# Patient Record
Sex: Female | Born: 1955 | Race: White | Hispanic: No | Smoking: Current every day smoker
Health system: Southern US, Community
[De-identification: ages and names within clinical notes are randomized; demographics above are authoritative.]

## PROBLEM LIST (undated history)

## (undated) DIAGNOSIS — F32A Depression, unspecified: Secondary | ICD-10-CM

## (undated) DIAGNOSIS — E079 Disorder of thyroid, unspecified: Secondary | ICD-10-CM

## (undated) DIAGNOSIS — F329 Major depressive disorder, single episode, unspecified: Secondary | ICD-10-CM

## (undated) DIAGNOSIS — J449 Chronic obstructive pulmonary disease, unspecified: Secondary | ICD-10-CM

## (undated) DIAGNOSIS — F419 Anxiety disorder, unspecified: Secondary | ICD-10-CM

## (undated) HISTORY — PX: EYE SURGERY: SHX253

## (undated) HISTORY — PX: HIP SURGERY: SHX245

---

## 2004-08-30 ENCOUNTER — Emergency Department: Payer: Self-pay | Admitting: Emergency Medicine

## 2004-10-16 ENCOUNTER — Emergency Department: Payer: Self-pay | Admitting: Emergency Medicine

## 2005-03-09 ENCOUNTER — Emergency Department: Payer: Self-pay | Admitting: Emergency Medicine

## 2005-04-07 ENCOUNTER — Emergency Department: Payer: Self-pay | Admitting: Emergency Medicine

## 2005-07-26 ENCOUNTER — Inpatient Hospital Stay: Payer: Self-pay | Admitting: Internal Medicine

## 2005-07-26 ENCOUNTER — Other Ambulatory Visit: Payer: Self-pay

## 2005-07-27 ENCOUNTER — Inpatient Hospital Stay: Payer: Self-pay | Admitting: Psychiatry

## 2005-11-20 ENCOUNTER — Emergency Department: Payer: Self-pay | Admitting: Emergency Medicine

## 2006-03-21 ENCOUNTER — Emergency Department: Payer: Self-pay | Admitting: Emergency Medicine

## 2006-05-19 ENCOUNTER — Emergency Department: Payer: Self-pay | Admitting: Emergency Medicine

## 2006-05-19 ENCOUNTER — Other Ambulatory Visit: Payer: Self-pay

## 2006-10-05 ENCOUNTER — Emergency Department: Payer: Self-pay | Admitting: Emergency Medicine

## 2006-10-06 ENCOUNTER — Emergency Department: Payer: Self-pay

## 2006-10-07 ENCOUNTER — Emergency Department: Payer: Self-pay | Admitting: Emergency Medicine

## 2006-10-09 ENCOUNTER — Emergency Department: Payer: Self-pay

## 2006-10-11 ENCOUNTER — Emergency Department: Payer: Self-pay | Admitting: Emergency Medicine

## 2007-03-13 ENCOUNTER — Other Ambulatory Visit: Payer: Self-pay

## 2007-03-13 ENCOUNTER — Emergency Department: Payer: Self-pay | Admitting: Emergency Medicine

## 2007-04-09 ENCOUNTER — Emergency Department: Payer: Self-pay | Admitting: Emergency Medicine

## 2007-05-30 ENCOUNTER — Emergency Department: Payer: Self-pay | Admitting: Emergency Medicine

## 2007-06-19 ENCOUNTER — Emergency Department: Payer: Self-pay | Admitting: Emergency Medicine

## 2008-05-20 ENCOUNTER — Emergency Department: Payer: Self-pay | Admitting: Emergency Medicine

## 2008-06-29 ENCOUNTER — Emergency Department: Payer: Self-pay | Admitting: Emergency Medicine

## 2008-10-09 ENCOUNTER — Inpatient Hospital Stay: Payer: Self-pay | Admitting: Internal Medicine

## 2008-10-10 ENCOUNTER — Inpatient Hospital Stay: Payer: Self-pay | Admitting: Psychiatry

## 2008-12-26 ENCOUNTER — Emergency Department: Payer: Self-pay | Admitting: Emergency Medicine

## 2009-03-26 ENCOUNTER — Ambulatory Visit: Payer: Self-pay | Admitting: Ophthalmology

## 2009-03-31 ENCOUNTER — Ambulatory Visit: Payer: Self-pay | Admitting: Ophthalmology

## 2009-05-20 ENCOUNTER — Ambulatory Visit: Payer: Self-pay | Admitting: Otolaryngology

## 2009-07-20 ENCOUNTER — Ambulatory Visit: Payer: Self-pay | Admitting: Ophthalmology

## 2009-11-09 ENCOUNTER — Inpatient Hospital Stay: Payer: Self-pay | Admitting: Internal Medicine

## 2009-11-10 ENCOUNTER — Inpatient Hospital Stay: Payer: Self-pay | Admitting: Unknown Physician Specialty

## 2010-01-13 ENCOUNTER — Ambulatory Visit: Payer: Self-pay | Admitting: Gastroenterology

## 2010-04-24 ENCOUNTER — Emergency Department: Payer: Self-pay | Admitting: Unknown Physician Specialty

## 2010-10-16 ENCOUNTER — Emergency Department: Payer: Self-pay | Admitting: Emergency Medicine

## 2011-04-09 ENCOUNTER — Emergency Department: Payer: Self-pay | Admitting: Unknown Physician Specialty

## 2011-04-24 ENCOUNTER — Emergency Department: Payer: Self-pay | Admitting: Emergency Medicine

## 2011-05-13 ENCOUNTER — Emergency Department: Payer: Self-pay | Admitting: Emergency Medicine

## 2012-06-07 ENCOUNTER — Emergency Department: Payer: Self-pay | Admitting: Emergency Medicine

## 2012-06-07 LAB — COMPREHENSIVE METABOLIC PANEL
Albumin: 3.1 g/dL — ABNORMAL LOW (ref 3.4–5.0)
Alkaline Phosphatase: 115 U/L (ref 50–136)
Anion Gap: 4 — ABNORMAL LOW (ref 7–16)
BUN: 3 mg/dL — ABNORMAL LOW (ref 7–18)
Bilirubin,Total: 0.3 mg/dL (ref 0.2–1.0)
Chloride: 104 mmol/L (ref 98–107)
Co2: 31 mmol/L (ref 21–32)
Creatinine: 0.69 mg/dL (ref 0.60–1.30)
EGFR (African American): >60
EGFR (Non-African Amer.): >60
Potassium: 3.6 mmol/L (ref 3.5–5.1)
SGOT(AST): 19 U/L (ref 15–37)
SGPT (ALT): 13 U/L

## 2012-06-07 LAB — CBC
HCT: 37.6 % (ref 35.0–47.0)
HGB: 12.5 g/dL (ref 12.0–16.0)
MCH: 28.9 pg (ref 26.0–34.0)
MCV: 87 fL (ref 80–100)
RBC: 4.32 10*6/uL (ref 3.80–5.20)
WBC: 5 10*3/uL (ref 3.6–11.0)

## 2012-10-06 ENCOUNTER — Emergency Department: Payer: Self-pay | Admitting: Emergency Medicine

## 2012-10-13 ENCOUNTER — Emergency Department: Payer: Self-pay | Admitting: Unknown Physician Specialty

## 2013-01-16 ENCOUNTER — Inpatient Hospital Stay: Payer: Self-pay | Admitting: General Practice

## 2013-01-16 LAB — DRUG SCREEN, URINE
Barbiturates, Ur Screen: NEGATIVE (ref ?–200)
Benzodiazepine, Ur Scrn: POSITIVE (ref ?–200)
Cannabinoid 50 Ng, Ur ~~LOC~~: NEGATIVE (ref ?–50)
Cocaine Metabolite,Ur ~~LOC~~: NEGATIVE (ref ?–300)
MDMA (Ecstasy)Ur Screen: NEGATIVE (ref ?–500)
Methadone, Ur Screen: NEGATIVE (ref ?–300)

## 2013-01-16 LAB — COMPREHENSIVE METABOLIC PANEL
Anion Gap: 10 (ref 7–16)
BUN: 8 mg/dL (ref 7–18)
Bilirubin,Total: 0.5 mg/dL (ref 0.2–1.0)
Chloride: 96 mmol/L — ABNORMAL LOW (ref 98–107)
Creatinine: 0.73 mg/dL (ref 0.60–1.30)
EGFR (African American): >60
EGFR (Non-African Amer.): >60
Osmolality: 261 (ref 275–301)
SGOT(AST): 27 U/L (ref 15–37)
SGPT (ALT): 18 U/L (ref 12–78)
Total Protein: 7.3 g/dL (ref 6.4–8.2)

## 2013-01-16 LAB — URINALYSIS, COMPLETE
Bacteria: NONE SEEN
Bilirubin,UR: NEGATIVE
Glucose,UR: NEGATIVE mg/dL (ref 0–75)
Ketone: NEGATIVE
Nitrite: NEGATIVE
Ph: 6 (ref 4.5–8.0)
Squamous Epithelial: <1
WBC UR: 816 /HPF (ref 0–5)

## 2013-01-16 LAB — CBC
HCT: 34.3 % — ABNORMAL LOW (ref 35.0–47.0)
HGB: 11.6 g/dL — ABNORMAL LOW (ref 12.0–16.0)
MCH: 30 pg (ref 26.0–34.0)
MCHC: 33.8 g/dL (ref 32.0–36.0)
MCV: 89 fL (ref 80–100)
RDW: 14.8 % — ABNORMAL HIGH (ref 11.5–14.5)
WBC: 11 10*3/uL (ref 3.6–11.0)

## 2013-01-16 LAB — APTT: Activated PTT: 30.1 secs (ref 23.6–35.9)

## 2013-01-16 LAB — PROTIME-INR: Prothrombin Time: 12.6 secs (ref 11.5–14.7)

## 2013-01-17 LAB — BASIC METABOLIC PANEL
Anion Gap: 6 — ABNORMAL LOW (ref 7–16)
Calcium, Total: 7.7 mg/dL — ABNORMAL LOW (ref 8.5–10.1)
Chloride: 107 mmol/L (ref 98–107)
Co2: 26 mmol/L (ref 21–32)
Creatinine: 0.74 mg/dL (ref 0.60–1.30)
Glucose: 131 mg/dL — ABNORMAL HIGH (ref 65–99)
Osmolality: 277 (ref 275–301)
Potassium: 4.3 mmol/L (ref 3.5–5.1)

## 2013-01-17 LAB — HEMOGLOBIN
HGB: 8.4 g/dL — ABNORMAL LOW (ref 12.0–16.0)
HGB: 7.2 g/dL — ABNORMAL LOW (ref 12.0–16.0)

## 2013-01-17 LAB — PLATELET COUNT: Platelet: 134 10*3/uL — ABNORMAL LOW (ref 150–440)

## 2013-01-18 LAB — BASIC METABOLIC PANEL
Anion Gap: 7 (ref 7–16)
BUN: 4 mg/dL — ABNORMAL LOW (ref 7–18)
Calcium, Total: 7.7 mg/dL — ABNORMAL LOW (ref 8.5–10.1)
Chloride: 107 mmol/L (ref 98–107)
Co2: 26 mmol/L (ref 21–32)
Glucose: 114 mg/dL — ABNORMAL HIGH (ref 65–99)
Osmolality: 277 (ref 275–301)
Potassium: 3.7 mmol/L (ref 3.5–5.1)
Sodium: 140 mmol/L (ref 136–145)

## 2013-01-18 LAB — URINE CULTURE

## 2013-01-20 LAB — HEMOGLOBIN: HGB: 8 g/dL — ABNORMAL LOW (ref 12.0–16.0)

## 2013-01-21 LAB — BASIC METABOLIC PANEL
Anion Gap: 5 — ABNORMAL LOW (ref 7–16)
Co2: 27 mmol/L (ref 21–32)
Creatinine: 0.85 mg/dL (ref 0.60–1.30)
EGFR (African American): >60
Glucose: 117 mg/dL — ABNORMAL HIGH (ref 65–99)
Osmolality: 286 (ref 275–301)
Sodium: 143 mmol/L (ref 136–145)

## 2013-01-21 LAB — HEMOGLOBIN: HGB: 8.3 g/dL — ABNORMAL LOW (ref 12.0–16.0)

## 2013-01-22 LAB — HEMOGLOBIN: HGB: 9.5 g/dL — ABNORMAL LOW (ref 12.0–16.0)

## 2014-10-01 ENCOUNTER — Emergency Department: Payer: Self-pay | Admitting: Emergency Medicine

## 2014-10-01 LAB — CBC
HCT: 41.7 % (ref 35.0–47.0)
HGB: 13.9 g/dL (ref 12.0–16.0)
MCH: 29.8 pg (ref 26.0–34.0)
MCHC: 33.2 g/dL (ref 32.0–36.0)
MCV: 90 fL (ref 80–100)
Platelet: 162 10*3/uL (ref 150–440)
RBC: 4.65 10*6/uL (ref 3.80–5.20)
RDW: 14.1 % (ref 11.5–14.5)
WBC: 6.8 10*3/uL (ref 3.6–11.0)

## 2014-10-01 LAB — TROPONIN I: Troponin-I: <0.02 ng/mL

## 2014-10-01 LAB — BASIC METABOLIC PANEL
ANION GAP: 7 (ref 7–16)
BUN: 12 mg/dL (ref 7–18)
Calcium, Total: 8.6 mg/dL (ref 8.5–10.1)
Chloride: 105 mmol/L (ref 98–107)
Co2: 27 mmol/L (ref 21–32)
Creatinine: 0.8 mg/dL (ref 0.60–1.30)
EGFR (Non-African Amer.): >60
GLUCOSE: 97 mg/dL (ref 65–99)
OSMOLALITY: 277 (ref 275–301)
Potassium: 3.7 mmol/L (ref 3.5–5.1)
SODIUM: 139 mmol/L (ref 136–145)

## 2015-03-08 ENCOUNTER — Emergency Department
Admit: 2015-03-08 | Disposition: A | Discharge status: Home / Self Care | Payer: Self-pay | Admitting: Emergency Medicine
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
## 2015-03-20 NOTE — Op Note (Signed)
PATIENT NAME:  Caroline Coleman, Caroline Coleman MR#:  [id number] DATE OF BIRTH:  Jul 05, 1956  DATE OF PROCEDURE:  01/16/2013  PREOPERATIVE DIAGNOSIS: Comminuted right intertrochanteric femur fracture.   POSTOPERATIVE DIAGNOSIS: Comminuted right intertrochanteric femur fracture.   PROCEDURE PERFORMED: Open reduction and internal fixation, right intertrochanteric femur fracture.   SURGEON: Illene LabradorJames P. Angie FavaHooten Jr., MD   ANESTHESIA: General.   ESTIMATED BLOOD LOSS: 50 mL.   FLUIDS REPLACED: 1200 mL of crystalloid.   DRAINS: None.   IMPLANTS UTILIZED: Synthes 11 mm, 130 degree trochanteric fixation nail, 100 mm helical blade and Coleman 5.0 x 38 mm locking screw.   INDICATIONS FOR SURGERY: The patient is Coleman 59 year old female who tripped and fell at home, landing on her right hip. She presented on the day of surgery via EMS, at which time x-rays demonstrated Coleman comminuted right intertrochanteric femur fracture. After discussion of risks and benefits of surgical intervention, the patient expressed understanding of the risks and benefits and agreed with plans for surgical intervention.   PROCEDURE IN DETAIL: The patient was brought into the operating room, and after adequate general anesthesia was achieved, the patient was transferred to the fracture table. Traction was applied to the right lower extremity, and provisional reduction was performed of the fracture. Position was confirmed in both AP and lateral planes using the C-arm. All bony prominences were well padded. The patient's right hip and leg were cleaned and prepped with alcohol and DuraPrep and draped in the usual sterile fashion. Coleman "timeout" was performed as per usual protocol. Coleman lateral longitudinal incision was made extending from the tip of the trochanter proximally. Dissection was carried down through the fascia to the tip of the greater trochanter. Coleman distally threaded guidepin was inserted through the tip of the trochanter down the medullary canal. Position  was confirmed in both AP and lateral planes. Step drill was used to enlarge the entry site. Given the patient's age and radiographic appearance of good cortical bone, it was elected to proceed with placement of Coleman short trochanteric fixation nail. An 11 mm, 130 degree titanium trochanteric fixation nail was advanced over the guidepin. Position was confirmed in both AP and lateral planes. Coleman second stab incision was made, and tissue protector was inserted through the outrigger device and advanced to the lateral aspect of the femur. Distally threaded guidepin was inserted into the femoral neck and head. Good position was noted in both AP and lateral planes. Measurements were obtained, and it was felt that Coleman 100 mm helical blade was appropriate length. Cortex was reamed, followed by advancement of Coleman cannulated reamer. Coleman 100 mm helical blade was then advanced over the guidepin. Good bony purchase was appreciated. Compression was applied using the outrigger device, with excellent compression noted at the fracture site. The locking device was engaged proximally. Next, another lateral stab incision was made, and Coleman tissue protector was inserted through the outrigger device for placement of the distal locking screw. Under visualization with the C-arm, the distal locking site was drilled, and measurement was obtained. Coleman 5.0 x 38 mm locking screw was then utilized. Excellent bony purchase was appreciated. Outrigger device was removed, and the fracture site was visualized in multiple planes using C-arm, with good reduction appreciated and good position of the implants.   The wounds were irrigated with copious amounts of normal saline with antibiotic solution. Fascia was reapproximated using #1 Vicryl. The subcutaneous tissue was approximated in layers using first #0 Vicryl, followed #2-0 Vicryl. Skin was closed with skin  staples. Sterile dressing was applied.   The patient tolerated the procedure well. She was transported to  the recovery room in stable condition.   ____________________________ Illene Labrador. Angie Fava., MD jph:OSi D: 01/17/2013 07:25:00 ET T: 01/17/2013 07:38:04 ET JOB#: [id number]  cc: Illene Labrador. Angie Fava., MD, <Dictator> Illene Labrador Angie Fava MD ELECTRONICALLY SIGNED 01/20/2013 15:13
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
## 2015-03-20 NOTE — Consult Note (Signed)
Brief Consult Note: Diagnosis: Hip fracture.   Patient was seen by consultant.   Consult note dictated.   Recommend to proceed with surgery or procedure.   Orders entered.   Comments: 59 yo female with H/o CAD dx over 2 yrs ago by PCP, copd , depression, hypothyroidsm, h/o etoh abuse and current smoker. pt is cleared for surgery. no need of non invasive stress testing or pft's 1.Hip fracture. risk factors base on cv clearance are mostly her H/o CAD but pt does not have any active symptoms, her surgery is intermediate risk and she is able to do 4MET's actvity wise. she has some SOB mostly related to her COPD but no acute HF or ACS. cv risk ( class I ) 1% risk for complication\. as far as her COPD she is low risk base on Canet criteria. + smoker but no active infection within last month, no changes on CXR. no need for ABG or PFT's work on smoking sessation.  recs. b blocker perioperatorily has demostrated no major advantages if pt has not been taking them before.her HR is 70's and has COPD.  Electronic Signatures: Berlinda LastSanchez Gutierrez, Regan Rakersoberto (MD)  (Signed 19-Feb-14 16:44)  Authored: Brief Consult Note   Last Updated: 19-Feb-14 16:44 by Felipa FurnaceSanchez Gutierrez, Clare Casto (MD)
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
## 2015-05-04 DIAGNOSIS — Z961 Presence of intraocular lens: Secondary | ICD-10-CM | POA: Insufficient documentation

## 2015-05-04 DIAGNOSIS — Z8249 Family history of ischemic heart disease and other diseases of the circulatory system: Secondary | ICD-10-CM | POA: Insufficient documentation

## 2015-05-04 DIAGNOSIS — F411 Generalized anxiety disorder: Secondary | ICD-10-CM | POA: Insufficient documentation

## 2015-05-04 DIAGNOSIS — E039 Hypothyroidism, unspecified: Secondary | ICD-10-CM | POA: Insufficient documentation

## 2015-05-04 DIAGNOSIS — J449 Chronic obstructive pulmonary disease, unspecified: Secondary | ICD-10-CM | POA: Insufficient documentation

## 2015-05-04 DIAGNOSIS — E049 Nontoxic goiter, unspecified: Secondary | ICD-10-CM | POA: Insufficient documentation

## 2015-05-04 DIAGNOSIS — K08109 Complete loss of teeth, unspecified cause, unspecified class: Secondary | ICD-10-CM | POA: Insufficient documentation

## 2015-05-04 DIAGNOSIS — K635 Polyp of colon: Secondary | ICD-10-CM | POA: Insufficient documentation

## 2015-05-04 DIAGNOSIS — E7800 Pure hypercholesterolemia, unspecified: Secondary | ICD-10-CM | POA: Insufficient documentation

## 2015-08-18 ENCOUNTER — Emergency Department: Payer: Medicaid Other

## 2015-08-18 ENCOUNTER — Encounter: Payer: Self-pay | Admitting: Emergency Medicine

## 2015-08-18 DIAGNOSIS — Z79899 Other long term (current) drug therapy: Secondary | ICD-10-CM | POA: Insufficient documentation

## 2015-08-18 DIAGNOSIS — R05 Cough: Secondary | ICD-10-CM | POA: Diagnosis not present

## 2015-08-18 DIAGNOSIS — Z720 Tobacco use: Secondary | ICD-10-CM | POA: Diagnosis not present

## 2015-08-18 DIAGNOSIS — Z760 Encounter for issue of repeat prescription: Secondary | ICD-10-CM | POA: Insufficient documentation

## 2015-08-18 DIAGNOSIS — R079 Chest pain, unspecified: Secondary | ICD-10-CM | POA: Insufficient documentation

## 2015-08-18 DIAGNOSIS — F419 Anxiety disorder, unspecified: Secondary | ICD-10-CM | POA: Diagnosis not present

## 2015-08-18 DIAGNOSIS — Z880 Allergy status to penicillin: Secondary | ICD-10-CM | POA: Insufficient documentation

## 2015-08-18 LAB — URINALYSIS COMPLETE WITH MICROSCOPIC (ARMC ONLY)
BACTERIA UA: NONE SEEN
BILIRUBIN URINE: NEGATIVE
Glucose, UA: NEGATIVE mg/dL
HGB URINE DIPSTICK: NEGATIVE
Ketones, ur: NEGATIVE mg/dL
LEUKOCYTES UA: NEGATIVE
NITRITE: NEGATIVE
PH: 7 (ref 5.0–8.0)
PROTEIN: NEGATIVE mg/dL
Specific Gravity, Urine: 1.004 — ABNORMAL LOW (ref 1.005–1.030)
Squamous Epithelial / LPF: NONE SEEN

## 2015-08-18 LAB — COMPREHENSIVE METABOLIC PANEL
ALT: 14 U/L (ref 14–54)
ANION GAP: 8 (ref 5–15)
AST: 20 U/L (ref 15–41)
Albumin: 5 g/dL (ref 3.5–5.0)
Alkaline Phosphatase: 102 U/L (ref 38–126)
BUN: 7 mg/dL (ref 6–20)
CALCIUM: 9.7 mg/dL (ref 8.9–10.3)
CHLORIDE: 102 mmol/L (ref 101–111)
CO2: 29 mmol/L (ref 22–32)
Creatinine, Ser: 0.91 mg/dL (ref 0.44–1.00)
GFR calc non Af Amer: >60 mL/min (ref >60)
Glucose, Bld: 98 mg/dL (ref 65–99)
POTASSIUM: 4.3 mmol/L (ref 3.5–5.1)
SODIUM: 139 mmol/L (ref 135–145)
Total Bilirubin: 0.5 mg/dL (ref 0.3–1.2)
Total Protein: 8.1 g/dL (ref 6.5–8.1)

## 2015-08-18 LAB — CBC WITH DIFFERENTIAL/PLATELET
BASOS PCT: 1 %
Basophils Absolute: 0.1 10*3/uL (ref 0–0.1)
EOS ABS: 0.2 10*3/uL (ref 0–0.7)
EOS PCT: 3 %
HCT: 45 % (ref 35.0–47.0)
Hemoglobin: 14.6 g/dL (ref 12.0–16.0)
LYMPHS ABS: 2.7 10*3/uL (ref 1.0–3.6)
Lymphocytes Relative: 41 %
MCH: 28.8 pg (ref 26.0–34.0)
MCHC: 32.3 g/dL (ref 32.0–36.0)
MCV: 89.1 fL (ref 80.0–100.0)
MONOS PCT: 7 %
Monocytes Absolute: 0.5 10*3/uL (ref 0.2–0.9)
Neutro Abs: 3.1 10*3/uL (ref 1.4–6.5)
Neutrophils Relative %: 48 %
PLATELETS: 182 10*3/uL (ref 150–440)
RBC: 5.05 MIL/uL (ref 3.80–5.20)
RDW: 15.1 % — AB (ref 11.5–14.5)
WBC: 6.5 10*3/uL (ref 3.6–11.0)

## 2015-08-18 LAB — TROPONIN I: Troponin I: <0.03 ng/mL (ref <0.031)

## 2015-08-18 NOTE — ED Notes (Signed)
Pt presents to ED by EMS with chest pain and cough with shortness of breath for the past couple of weeks. Pt states her MD lost his license and she has not been able to get her medications refilled for about a month including her anxiety medications and her thyroid medication. Pt states she now has a goiter on the right side of her neck and that she has been having issues with "bladder just pouring during the night". Pt unsure if maybe her sob could be due to her anxiety. Pt currently appears slightly anxious with no increased work of breathing or acute distress noted at this time.

## 2015-08-19 ENCOUNTER — Emergency Department
Admission: EM | Admit: 2015-08-19 | Discharge: 2015-08-19 | Disposition: A | Discharge status: Home / Self Care | Payer: Medicaid Other | Attending: Emergency Medicine | Admitting: Emergency Medicine

## 2015-08-19 DIAGNOSIS — Z760 Encounter for issue of repeat prescription: Secondary | ICD-10-CM

## 2015-08-19 HISTORY — DX: Major depressive disorder, single episode, unspecified: F32.9

## 2015-08-19 HISTORY — DX: Disorder of thyroid, unspecified: E07.9

## 2015-08-19 HISTORY — DX: Depression, unspecified: F32.A

## 2015-08-19 HISTORY — DX: Anxiety disorder, unspecified: F41.9

## 2015-08-19 MED ORDER — PAROXETINE HCL 40 MG PO TABS: 40.0000 mg | ORAL_TABLET | Freq: Every day | ORAL | Status: DC

## 2015-08-19 MED ORDER — LEVOTHYROXINE SODIUM 150 MCG PO TABS: 150.0000 ug | ORAL_TABLET | Freq: Every day | ORAL | Status: DC

## 2015-08-19 MED ORDER — GABAPENTIN 400 MG PO CAPS: 800.0000 mg | ORAL_CAPSULE | Freq: Two times a day (BID) | ORAL | Status: DC

## 2015-08-19 NOTE — ED Notes (Signed)
MD at bedside. 

## 2015-08-19 NOTE — Discharge Instructions (Signed)
Medication Refill, Emergency Department °We have refilled your medication today as a courtesy to you. It is best for your medical care, however, to take care of getting refills done through your primary caregiver's office. They have your records and can do a better job of follow-up than we can in the emergency department. °On maintenance medications, we often only prescribe enough medications to get you by until you are able to see your regular caregiver. This is a more expensive way to refill medications. °In the future, please plan for refills so that you will not have to use the emergency department for this. °Thank you for your help. Your help allows us to better take care of the daily emergencies that enter our department. °Document Released: 03/02/2004 Document Revised: 02/06/2012 Document Reviewed: 02/21/2014 °[employer]® Patient Information ©2015 [employer]. This information is not intended to replace advice given to you by your health care provider. Make sure you discuss any questions you have with your health care provider. ° °

## 2015-08-19 NOTE — ED Provider Notes (Signed)
[facility] Emergency Department Provider Note  ____________________________________________  Time seen: 12:45 AM  I have reviewed the triage vital signs and the nursing notes.   HISTORY  Chief Complaint Chest Pain; Cough; and Medication Refill      HPI Caroline Coleman is a 59 y.o. female presents with multiple medical complaints including patient states that her primary care physician lost his license and a such she's been unable to receive her regular medications for about a month including Xanax, Synthroid, Paxil and gabapentin. Patient also admits to feeling anxious which she thinks is secondary to her not having her "nerve pill. She states that she's been taking Xanax and Paxil approximately 20 years. Patient admits to her "usual cough. Patient admits to smoking a pack cigarettes per day.     Past Medical History  Diagnosis Date  . Thyroid disease   . Anxiety   . Depression     There are no active problems to display for this patient.   Past Surgical History  Procedure Laterality Date  . Hip surgery    . Eye surgery      Current Outpatient Rx  Name  Route  Sig  Dispense  Refill  . ALPRAZolam (XANAX) 0.25 MG tablet   Oral   Take 1 tablet by mouth at bedtime.      5   . gabapentin (NEURONTIN) 400 MG capsule   Oral   Take 800 mg by mouth 4 (four) times daily.      0   . levothyroxine (SYNTHROID, LEVOTHROID) 150 MCG tablet   Oral   Take 1 tablet by mouth daily.      0   . PARoxetine (PAXIL) 40 MG tablet   Oral   Take 1 tablet by mouth daily.      0     Allergies Penicillins and Sulfa antibiotics  No family history on file.  Social History Social History  Substance Use Topics  . Smoking status: Current Every Day Smoker -- 1.00 packs/day    Types: Cigarettes  . Smokeless tobacco: None  . Alcohol Use: No    Review of Systems  Constitutional: Negative for fever. Eyes: Negative for visual changes. ENT: Negative  for sore throat. Cardiovascular: Negative for chest pain. Respiratory: Negative for shortness of breath. Gastrointestinal: Negative for abdominal pain, vomiting and diarrhea. Genitourinary: Negative for dysuria. Musculoskeletal: Negative for back pain. Skin: Negative for rash. Neurological: Negative for headaches, focal weakness or numbness. Psychiatric: Positive for anxiety  10-point ROS otherwise negative.  ____________________________________________   PHYSICAL EXAM:  VITAL SIGNS: ED Triage Vitals  Enc Vitals Group     BP 08/18/15 2226 124/80 mmHg     Pulse Rate 08/18/15 2226 73     Resp 08/18/15 2226 18     Temp 08/18/15 2226 98.1 F (36.7 C)     Temp Source 08/18/15 2226 Oral     SpO2 08/18/15 2226 96 %     Weight 08/18/15 2226 150 lb (68.04 kg)     Height 08/18/15 2226  (1.626 m)     Head Cir --      Peak Flow --      Pain Score 08/18/15 2229 6     Pain Loc --      Pain Edu? --      Excl. in GC? --      Constitutional: Alert and oriented. Well appearing and in no distress. Eyes: Conjunctivae are normal. PERRL. Normal extraocular movements. ENT  Head: Normocephalic and atraumatic.   Nose: No congestion/rhinnorhea.   Mouth/Throat: Mucous membranes are moist.   Neck: No stridor. Hematological/Lymphatic/Immunilogical: No cervical lymphadenopathy. Cardiovascular: Normal rate, regular rhythm. Normal and symmetric distal pulses are present in all extremities. No murmurs, rubs, or gallops. Respiratory: Normal respiratory effort without tachypnea nor retractions. Breath sounds are clear and equal bilaterally. No wheezes/rales/rhonchi. Gastrointestinal: Soft and nontender. No distention. There is no CVA tenderness. Genitourinary: deferred Musculoskeletal: Nontender with normal range of motion in all extremities. No joint effusions.  No lower extremity tenderness nor edema. Neurologic:  Normal speech and language. No gross focal neurologic deficits are  appreciated. Speech is normal.  Skin:  Skin is warm, dry and intact. No rash noted. Psychiatric: Mood and affect are normal. Speech and behavior are normal. Patient exhibits appropriate insight and judgment.  ____________________________________________    LABS (pertinent positives/negatives)  Labs Reviewed  CBC WITH DIFFERENTIAL/PLATELET - Abnormal; Notable for the following:    RDW 15.1 (*)    All other components within normal limits  URINALYSIS COMPLETEWITH MICROSCOPIC ([facility] ONLY) - Abnormal; Notable for the following:    Color, Urine STRAW (*)    APPearance CLEAR (*)    Specific Gravity, Urine 1.004 (*)    All other components within normal limits  TROPONIN I  COMPREHENSIVE METABOLIC PANEL     ____________________________________________   EKG  ED ECG REPORT I, BROWN, Spur N, the attending physician, personally viewed and interpreted this ECG.   Date: 08/19/2015  EKG Time: 10:29 PM  Rate: 74  Rhythm: Normal sinus rhythm  Axis: None  Intervals: Normal  ST&T Change: None   ____________________________________________    RADIOLOGY  DG Chest 2 View (Final result) Result time: 08/18/15 23:09:32   Final result by Rad Results In Interface (08/18/15 23:09:32)   Narrative:   CLINICAL DATA: Shortness of breath and chest tightness, smoker  EXAM: CHEST 2 VIEW  COMPARISON: 03/08/2015  FINDINGS: Normal heart size, mediastinal contours and pulmonary vascularity.  Minimal chronic peribronchial thickening with pulmonary hyperinflation.  No pulmonary infiltrate, pleural effusion or pneumothorax.  Osseous structures unremarkable.  IMPRESSION: Bronchitic changes and hyperinflation suggesting COPD.  No acute infiltrate.   Electronically Signed By: Ulyses Southward M.D. On: 08/18/2015 23:09        INITIAL IMPRESSION / ASSESSMENT AND PLAN / ED COURSE  Pertinent labs & imaging results that were available during my care of the patient were  reviewed by me and considered in my medical decision making (see chart for details).    ____________________________________________   FINAL CLINICAL IMPRESSION(S) / ED DIAGNOSES  Final diagnoses:  Medication refill      Darci Current, MD 08/19/15 [phone]

## 2015-10-15 ENCOUNTER — Emergency Department
Admission: EM | Admit: 2015-10-15 | Discharge: 2015-10-15 | Disposition: A | Discharge status: Home / Self Care | Payer: Medicaid Other | Attending: Emergency Medicine | Admitting: Emergency Medicine

## 2015-10-15 DIAGNOSIS — F17210 Nicotine dependence, cigarettes, uncomplicated: Secondary | ICD-10-CM | POA: Diagnosis not present

## 2015-10-15 DIAGNOSIS — R140 Abdominal distension (gaseous): Secondary | ICD-10-CM | POA: Diagnosis not present

## 2015-10-15 DIAGNOSIS — G8929 Other chronic pain: Secondary | ICD-10-CM | POA: Diagnosis not present

## 2015-10-15 DIAGNOSIS — M545 Low back pain: Secondary | ICD-10-CM | POA: Diagnosis not present

## 2015-10-15 DIAGNOSIS — R109 Unspecified abdominal pain: Secondary | ICD-10-CM

## 2015-10-15 DIAGNOSIS — Z880 Allergy status to penicillin: Secondary | ICD-10-CM | POA: Insufficient documentation

## 2015-10-15 DIAGNOSIS — R110 Nausea: Secondary | ICD-10-CM | POA: Diagnosis not present

## 2015-10-15 DIAGNOSIS — Z79899 Other long term (current) drug therapy: Secondary | ICD-10-CM | POA: Diagnosis not present

## 2015-10-15 DIAGNOSIS — F419 Anxiety disorder, unspecified: Secondary | ICD-10-CM | POA: Diagnosis not present

## 2015-10-15 LAB — CBC WITH DIFFERENTIAL/PLATELET
BASOS PCT: 1 %
Basophils Absolute: 0 10*3/uL (ref 0–0.1)
EOS ABS: 0.1 10*3/uL (ref 0–0.7)
EOS PCT: 2 %
HCT: 39.1 % (ref 35.0–47.0)
Hemoglobin: 12.9 g/dL (ref 12.0–16.0)
LYMPHS ABS: 1.3 10*3/uL (ref 1.0–3.6)
Lymphocytes Relative: 23 %
MCH: 29.1 pg (ref 26.0–34.0)
MCHC: 33.1 g/dL (ref 32.0–36.0)
MCV: 87.8 fL (ref 80.0–100.0)
Monocytes Absolute: 0.5 10*3/uL (ref 0.2–0.9)
Monocytes Relative: 8 %
Neutro Abs: 3.7 10*3/uL (ref 1.4–6.5)
Neutrophils Relative %: 66 %
PLATELETS: 147 10*3/uL — AB (ref 150–440)
RBC: 4.45 MIL/uL (ref 3.80–5.20)
RDW: 14.8 % — ABNORMAL HIGH (ref 11.5–14.5)
WBC: 5.6 10*3/uL (ref 3.6–11.0)

## 2015-10-15 LAB — COMPREHENSIVE METABOLIC PANEL
ALT: 16 U/L (ref 14–54)
ANION GAP: 7 (ref 5–15)
AST: 29 U/L (ref 15–41)
Albumin: 4.1 g/dL (ref 3.5–5.0)
Alkaline Phosphatase: 80 U/L (ref 38–126)
BUN: 6 mg/dL (ref 6–20)
CHLORIDE: 101 mmol/L (ref 101–111)
CO2: 29 mmol/L (ref 22–32)
CREATININE: 0.76 mg/dL (ref 0.44–1.00)
Calcium: 9.4 mg/dL (ref 8.9–10.3)
GFR calc non Af Amer: >60 mL/min (ref >60)
GLUCOSE: 99 mg/dL (ref 65–99)
Potassium: 3.5 mmol/L (ref 3.5–5.1)
SODIUM: 137 mmol/L (ref 135–145)
Total Bilirubin: 0.5 mg/dL (ref 0.3–1.2)
Total Protein: 7.5 g/dL (ref 6.5–8.1)

## 2015-10-15 LAB — URINALYSIS COMPLETE WITH MICROSCOPIC (ARMC ONLY)
Bacteria, UA: NONE SEEN
Bilirubin Urine: NEGATIVE
GLUCOSE, UA: NEGATIVE mg/dL
KETONES UR: NEGATIVE mg/dL
LEUKOCYTES UA: NEGATIVE
Nitrite: NEGATIVE
Protein, ur: NEGATIVE mg/dL
SPECIFIC GRAVITY, URINE: 1.006 (ref 1.005–1.030)
pH: 6 (ref 5.0–8.0)

## 2015-10-15 LAB — LIPASE, BLOOD: LIPASE: 28 U/L (ref 11–51)

## 2015-10-15 MED ORDER — ACETAMINOPHEN 500 MG PO TABS
1000.0000 mg | ORAL_TABLET | Freq: Once | ORAL | Status: AC
  Administered 2015-10-15: 1000 mg via ORAL

## 2015-10-15 MED ORDER — PANTOPRAZOLE SODIUM 40 MG PO TBEC: 40.0000 mg | DELAYED_RELEASE_TABLET | Freq: Every day | ORAL | Status: DC

## 2015-10-15 MED ORDER — PANTOPRAZOLE SODIUM 40 MG PO TBEC
40.0000 mg | DELAYED_RELEASE_TABLET | Freq: Every day | ORAL | Status: DC
  Filled 2015-10-15: qty 1

## 2015-10-15 MED ORDER — ONDANSETRON 4 MG PO TBDP
4.0000 mg | ORAL_TABLET | Freq: Once | ORAL | Status: AC
  Administered 2015-10-15: 4 mg via ORAL

## 2015-10-15 MED ORDER — ONDANSETRON 4 MG PO TBDP
ORAL_TABLET | ORAL | Status: AC
  Filled 2015-10-15: qty 1

## 2015-10-15 MED ORDER — ONDANSETRON 4 MG PO TBDP: 4.0000 mg | ORAL_TABLET | Freq: Once | ORAL | Status: DC

## 2015-10-15 MED ORDER — ACETAMINOPHEN 500 MG PO TABS
ORAL_TABLET | ORAL | Status: AC
  Filled 2015-10-15: qty 2

## 2015-10-15 NOTE — Discharge Instructions (Signed)
These make an appointment to establish a primary care physician at the [facility]. Your primary care doctor will be able to help you refill all of your chronic medication prescriptions, as well as evaluate you for a long-term pain management plan.  Please return to the emergency department if you develop severe pain, inability to keep down fluids, fever, or any other symptoms concerning to you.

## 2015-10-15 NOTE — ED Notes (Signed)
AAOx3.  Skin warm and dry.  Ambulates with easy and steady gait. NAD 

## 2015-10-15 NOTE — ED Provider Notes (Signed)
[facility] Emergency Department Provider Note  ____________________________________________  Time seen: Approximately 9:41 PM  I have reviewed the triage vital signs and the nursing notes.   HISTORY  Chief Complaint Abdominal Pain    HPI Caroline Coleman is a 59 y.o. female with a history of chronic GERD, chronic anxiety, chronic back pain, and thyroid disease presenting with epigastric pain, nausea without vomiting, and chronic back pain. Patient states that she was a patient of Dr. Carron Brazen who recently lost his medical license and she has not had any of her regular medications in greater than 3 weeks. Her abdominal pain is described as an epigastric "sharp" pain that is worse when she lays down or after meals and is associated with nausea but no vomiting. She has not had any diarrhea, fevers, chills. She is also having a chronic back pain in the low back without any numbness tingling or weakness, no fecal or urinary incontinence or retention. This pain is unchanged in character or severity. She has not been having a difficult walking.   Past Medical History  Diagnosis Date  . Thyroid disease   . Anxiety   . Depression     There are no active problems to display for this patient.   Past Surgical History  Procedure Laterality Date  . Hip surgery    . Eye surgery      Current Outpatient Rx  Name  Route  Sig  Dispense  Refill  . ALPRAZolam (XANAX) 0.25 MG tablet   Oral   Take 1 tablet by mouth at bedtime.      5   . gabapentin (NEURONTIN) 400 MG capsule   Oral   Take 2 capsules (800 mg total) by mouth 2 (two) times daily.   60 capsule   0   . levothyroxine (SYNTHROID, LEVOTHROID) 150 MCG tablet   Oral   Take 1 tablet (150 mcg total) by mouth daily.   30 tablet   0   . PARoxetine (PAXIL) 40 MG tablet   Oral   Take 1 tablet (40 mg total) by mouth daily.   30 tablet   0     Allergies Penicillins and Sulfa antibiotics  No  family history on file.  Social History Social History  Substance Use Topics  . Smoking status: Current Every Day Smoker -- 1.00 packs/day    Types: Cigarettes  . Smokeless tobacco: Not on file  . Alcohol Use: No    Review of Systems Constitutional: No fever/chills Eyes: No visual changes. ENT: No sore throat. Cardiovascular: Denies chest pain, palpitations. Respiratory: Denies shortness of breath.  No cough. Gastrointestinal: Positive epigastric abdominal pain.  Positive nausea, no vomiting.  No diarrhea.  No constipation. Genitourinary: Negative for dysuria. Musculoskeletal: Positive for chronic low back pain. Skin: Negative for rash. Neurological: Negative for headaches, focal weakness or numbness. Psychiatric:Positive for mild anxiety 10-point ROS otherwise negative.  ____________________________________________   PHYSICAL EXAM:  VITAL SIGNS: ED Triage Vitals  Enc Vitals Group     BP 10/15/15 2021 100/50 mmHg     Pulse Rate 10/15/15 2021 73     Resp 10/15/15 2021 18     Temp 10/15/15 2021 98.7 F (37.1 C)     Temp Source 10/15/15 2021 Oral     SpO2 10/15/15 2021 96 %     Weight 10/15/15 2021 165 lb (74.844 kg)     Height 10/15/15 2021  (1.626 m)     Head Cir --  Peak Flow --      Pain Score 10/15/15 2021 10     Pain Loc --      Pain Edu? --      Excl. in GC? --     Constitutional: Alert and oriented. Chronically ill appearing and in no acute distress. Answer question appropriately. Eyes: Conjunctivae are normal.  EOMI. Head: Atraumatic. NECK: Fullness over the thyroid which is more prominent on the right side. No evidence of airway compromise. Nose: No congestion/rhinnorhea. Mouth/Throat: Mucous membranes are moist.  Neck: No stridor.  Supple.  Full range of motion without pain. Cardiovascular: Normal rate, regular rhythm. No murmurs, rubs or gallops.  Respiratory: Normal respiratory effort.  No retractions. Lungs CTAB.  No wheezes, rales or  ronchi. Gastrointestinal: Soft and nondistended. Mild tenderness to palpation in the epigastric area. No right upper quadrant tenderness, Murphy sign. No guarding or rebound, no peritoneal signs. Musculoskeletal: No LE edema.  Neurologic:  Normal speech and language. No gross focal neurologic deficits are appreciated.  Skin:  Skin is warm, dry and intact. No rash noted. Psychiatric: Mood is normal and affect is flat.Marland Kitchen. Speech and behavior are normal.  Normal judgement.  ____________________________________________   LABS (all labs ordered are listed, but only abnormal results are displayed)  Labs Reviewed  CBC WITH DIFFERENTIAL/PLATELET - Abnormal; Notable for the following:    RDW 14.8 (*)    Platelets 147 (*)    All other components within normal limits  URINALYSIS COMPLETEWITH MICROSCOPIC ([facility] ONLY) - Abnormal; Notable for the following:    Color, Urine YELLOW (*)    APPearance CLEAR (*)    Hgb urine dipstick 1+ (*)    Squamous Epithelial / LPF 0-5 (*)    All other components within normal limits  COMPREHENSIVE METABOLIC PANEL  LIPASE, BLOOD   ____________________________________________  EKG  Not indicated ____________________________________________  RADIOLOGY  No results found.  ____________________________________________   PROCEDURES  Procedure(s) performed: None  Critical Care performed: No ____________________________________________   INITIAL IMPRESSION / ASSESSMENT AND PLAN / ED COURSE  Pertinent labs & imaging results that were available during my care of the patient were reviewed by me and considered in my medical decision making (see chart for details).  59 y.o. female with multiple chronic conditions which are unchanged in character or severity who is presenting for refills on her medications which she has not taken in several weeks. There is no evidence of acute infection or acute surgical pathology either in her vital signs, or on my  examination. I have encouraged the patient to call the [facility] to establish a new primary care physician. I will give her prescriptions for GERD, nausea, and I have encouraged her to make a long-term pain medication plan with her new primary care physician. The patient understands and will be discharged in stable position she understands return precautions and follow-up instructions.  ____________________________________________  FINAL CLINICAL IMPRESSION(S) / ED DIAGNOSES  Final diagnoses:  Chronic abdominal pain  Abdominal distention  Low back pain without sciatica, unspecified back pain laterality  Nausea without vomiting      NEW MEDICATIONS STARTED DURING THIS VISIT:  New Prescriptions   No medications on file     Rockne MenghiniAnne-Caroline Rindi Beechy, MD 10/15/15 2147

## 2015-10-15 NOTE — ED Notes (Signed)
Pt in with co nausea and abd pain and low back pain x 2 weeks.  No able to see PMD because he lost his license.

## 2015-10-15 NOTE — ED Notes (Signed)
AAOx3.  Skin warm and dry.  NAD  Ambulates with easy and steady gait.  Posture upright and relaxed. 

## 2015-11-01 ENCOUNTER — Emergency Department
Admission: EM | Admit: 2015-11-01 | Discharge: 2015-11-01 | Disposition: A | Discharge status: Home / Self Care | Payer: Medicaid Other | Attending: Emergency Medicine | Admitting: Emergency Medicine

## 2015-11-01 ENCOUNTER — Emergency Department: Payer: Medicaid Other

## 2015-11-01 ENCOUNTER — Encounter: Payer: Self-pay | Admitting: Emergency Medicine

## 2015-11-01 DIAGNOSIS — Z880 Allergy status to penicillin: Secondary | ICD-10-CM | POA: Diagnosis not present

## 2015-11-01 DIAGNOSIS — F17210 Nicotine dependence, cigarettes, uncomplicated: Secondary | ICD-10-CM | POA: Insufficient documentation

## 2015-11-01 DIAGNOSIS — R0602 Shortness of breath: Secondary | ICD-10-CM | POA: Diagnosis present

## 2015-11-01 DIAGNOSIS — J441 Chronic obstructive pulmonary disease with (acute) exacerbation: Secondary | ICD-10-CM | POA: Diagnosis not present

## 2015-11-01 DIAGNOSIS — Z79899 Other long term (current) drug therapy: Secondary | ICD-10-CM | POA: Insufficient documentation

## 2015-11-01 HISTORY — DX: Chronic obstructive pulmonary disease, unspecified: J44.9

## 2015-11-01 MED ORDER — OPTICHAMBER ADVANTAGE MISC: 1.0000 | Freq: Once | Status: DC

## 2015-11-01 MED ORDER — PREDNISONE 20 MG PO TABS: ORAL_TABLET | ORAL | Status: DC

## 2015-11-01 MED ORDER — ALBUTEROL SULFATE HFA 108 (90 BASE) MCG/ACT IN AERS: 2.0000 | INHALATION_SPRAY | Freq: Four times a day (QID) | RESPIRATORY_TRACT | Status: DC | PRN

## 2015-11-01 NOTE — Discharge Instructions (Signed)
He fell significant only better after the nebulized treatments by EMS. Your chest x-ray did not show signs of pneumonia or other acute problems. Use the albuterol inhaler with spacer as needed. Take steroids as prescribed. Follow-up with  family practice or another provider.  Chronic Obstructive Pulmonary Disease Exacerbation Chronic obstructive pulmonary disease (COPD) is a common lung problem. In COPD, the flow of air from the lungs is limited. COPD exacerbations are times that breathing gets worse and you need extra treatment. Without treatment they can be life threatening. If they happen often, your lungs can become more damaged. If your COPD gets worse, your doctor may treat you with:  Medicines.  Oxygen.  Different ways to clear your airway, such as using a mask. HOME CARE  Do not smoke.  Avoid tobacco smoke and other things that bother your lungs.  If given, take your antibiotic medicine as told. Finish the medicine even if you start to feel better.  Only take medicines as told by your doctor.  Drink enough fluids to keep your pee (urine) clear or pale yellow (unless your doctor has told you not to).  Use a cool mist machine (vaporizer).  If you use oxygen or a machine that turns liquid medicine into a mist (nebulizer), continue to use them as told.  Keep up with shots (vaccinations) as told by your doctor.  Exercise regularly.  Eat healthy foods.  Keep all doctor visits as told. GET HELP RIGHT AWAY IF:  You are very short of breath and it gets worse.  You have trouble talking.  You have bad chest pain.  You have blood in your spit (sputum).  You have a fever.  You keep throwing up (vomiting).  You feel weak, or you pass out (faint).  You feel confused.  You keep getting worse. MAKE SURE YOU:  Understand these instructions.  Will watch your condition.  Will get help right away if you are not doing well or get worse.   This information is  not intended to replace advice given to you by your health care provider. Make sure you discuss any questions you have with your health care provider.   Document Released: 11/03/2011 Document Revised: 12/05/2014 Document Reviewed: 07/19/2013 Elsevier Interactive Patient Education [employer]2016 Elsevier Inc.

## 2015-11-01 NOTE — ED Notes (Addendum)
Pt arrived via EMS for complaints of increasing shortness of breath since yesterday. Pt has a history of COPD. Pt is a smoker. EMS VSS, NSR. EMS administered 2 duonebs, 1 albuterol neb, and 125 mg solu-medrol.

## 2015-11-01 NOTE — ED Provider Notes (Signed)
[facility] Emergency Department Provider Note  ____________________________________________  Time seen: 1145  I have reviewed the triage vital signs and the nursing notes.  History by:  Patient  HISTORY  Chief Complaint Shortness of Breath     HPI Caroline Coleman is a 59 y.o. female with history of COPD. She reports she has some double to breathing last night and then awoke with more distress this morning. She was having such difficulty vomit she reports she was not even able to speak. This was around 9 AM.  She called EMS. EMS provided her with 2 DuoNeb's & Medrol. The patient is feeling better now and is conversant.  She denies any fever. She reports chest tightness earlier but no chest pain. She reports this feels like her typical COPD exacerbation.  The patient tells me she is out of her asthma medications. Dr. Jim LikeNiemeier is her primary physician. Apparently he is out of practice. She has some refills   Past Medical History  Diagnosis Date  . Thyroid disease   . Anxiety   . Depression   . COPD (chronic obstructive pulmonary disease) (HCC)     There are no active problems to display for this patient.   Past Surgical History  Procedure Laterality Date  . Hip surgery    . Eye surgery      Current Outpatient Rx  Name  Route  Sig  Dispense  Refill  . albuterol (PROVENTIL HFA;VENTOLIN HFA) 108 (90 BASE) MCG/ACT inhaler   Inhalation   Inhale 2 puffs into the lungs every 6 (six) hours as needed for wheezing or shortness of breath.   1 Inhaler   2   . ALPRAZolam (XANAX) 0.25 MG tablet   Oral   Take 1 tablet by mouth at bedtime.      5   . gabapentin (NEURONTIN) 400 MG capsule   Oral   Take 2 capsules (800 mg total) by mouth 2 (two) times daily.   60 capsule   0   . levothyroxine (SYNTHROID, LEVOTHROID) 150 MCG tablet   Oral   Take 1 tablet (150 mcg total) by mouth daily.   30 tablet   0   . ondansetron (ZOFRAN-ODT) 4 MG  disintegrating tablet   Oral   Take 1 tablet (4 mg total) by mouth once.   20 tablet   0   . pantoprazole (PROTONIX) 40 MG tablet   Oral   Take 1 tablet (40 mg total) by mouth daily.   30 tablet   0   . PARoxetine (PAXIL) 40 MG tablet   Oral   Take 1 tablet (40 mg total) by mouth daily.   30 tablet   0   . predniSONE (DELTASONE) 20 MG tablet      Take 2 tabs per day on day 1 and a 2 Take 1 tab per day on day 34 and 5.   7 tablet   0   . Spacer/Aero-Holding Chambers (OPTICHAMBER ADVANTAGE) MISC   Other   1 each by Other route once. Always uses her when you're using a metered-dose inhaler. You've aromatase medicine as much, he won't have his much side effect, but you it twice as much medicine and your lungs.   1 each   0     Allergies Penicillins and Sulfa antibiotics  No family history on file.  Social History Social History  Substance Use Topics  . Smoking status: Current Every Day Smoker -- 1.00 packs/day  Types: Cigarettes  . Smokeless tobacco: None  . Alcohol Use: No    Review of Systems   Constitutional: Negative for fever/chills. ENT: Negative for congestion. Cardiovascular: Negative for chest pain. Respiratory: Negative for cough. Gastrointestinal: Negative for abdominal pain, vomiting and diarrhea. Genitourinary: Negative for dysuria. Musculoskeletal: No back pain. Skin: Negative for rash. Neurological: Negative for headache or focal weakness   10-point ROS otherwise negative.  ____________________________________________   PHYSICAL EXAM:  VITAL SIGNS: ED Triage Vitals  Enc Vitals Group     BP 11/01/15 1019 92/52 mmHg     Pulse --      Resp 11/01/15 1019 18     Temp 11/01/15 1019 97.4 F (36.3 C)     Temp Source 11/01/15 1019 Oral     SpO2 11/01/15 1019 100 %     Weight 11/01/15 1019 165 lb (74.844 kg)     Height 11/01/15 1019  (1.626 m)     Head Cir --      Peak Flow --      Pain Score 11/01/15 1022 0     Pain Loc --       Pain Edu? --      Excl. in GC? --     Constitutional:  Alert and oriented. Well appearing and in no distress. ENT   Head: Normocephalic and atraumatic.   Nose: No congestion/rhinnorhea.       Mouth: No erythema, no swelling   Cardiovascular: Normal rate at 86, regular rhythm, no murmur noted Respiratory:  Normal respiratory effort, no tachypnea.    Minimal wheeze noted in right lung, overall clear..  Gastrointestinal: Soft, no distention. Nontender Back: No muscle spasm, no tenderness, no CVA tenderness. Musculoskeletal: No deformity noted. Nontender with normal range of motion in all extremities.  No noted edema. Neurologic:  Communicative. Normal appearing spontaneous movement in all 4 extremities. No gross focal neurologic deficits are appreciated.  Skin:  Skin is warm, dry. No rash noted. Psychiatric: Mood and affect are normal. Speech and behavior are normal.  ____________________________________________   RADIOLOGY  Chest x-ray  IMPRESSION: Hyperinflation of the lungs and peribronchial thickening with central predominance, suggestive of COPD.  No evidence of focal airspace consolidation.   ____________________________________________   INITIAL IMPRESSION / ASSESSMENT AND PLAN / ED COURSE  Pertinent labs & imaging results that were available during my care of the patient were reviewed by me and considered in my medical decision making (see chart for details).  59 year old female with COPD who ran out of her medications. She had not at home. She feels much better after having received nebulizer treatments by EMS. Also received solu Medrol IV. Her chest x-ray did not show any acute findings. She does have evidence of COPD with hyperinflation.  I will rewrite prescriptions for albuterol MDI with a spacer as well as prednisone. I've asked her to follow-up with [place] [facility] practice or an alternative practice group, now that Dr. Jim Like is not currently  practicing.  ____________________________________________   FINAL CLINICAL IMPRESSION(S) / ED DIAGNOSES  Final diagnoses:  COPD exacerbation (HCC)      Darien Ramus, MD 11/01/15 1315

## 2015-12-06 ENCOUNTER — Other Ambulatory Visit: Payer: Self-pay

## 2015-12-06 ENCOUNTER — Encounter: Payer: Self-pay | Admitting: Emergency Medicine

## 2015-12-06 ENCOUNTER — Emergency Department: Payer: Medicaid Other

## 2015-12-06 ENCOUNTER — Emergency Department
Admission: EM | Admit: 2015-12-06 | Discharge: 2015-12-06 | Disposition: A | Discharge status: Home / Self Care | Payer: Medicaid Other | Attending: Emergency Medicine | Admitting: Emergency Medicine

## 2015-12-06 DIAGNOSIS — F17210 Nicotine dependence, cigarettes, uncomplicated: Secondary | ICD-10-CM | POA: Insufficient documentation

## 2015-12-06 DIAGNOSIS — Z9981 Dependence on supplemental oxygen: Secondary | ICD-10-CM | POA: Diagnosis not present

## 2015-12-06 DIAGNOSIS — Z79899 Other long term (current) drug therapy: Secondary | ICD-10-CM | POA: Diagnosis not present

## 2015-12-06 DIAGNOSIS — Z7952 Long term (current) use of systemic steroids: Secondary | ICD-10-CM | POA: Insufficient documentation

## 2015-12-06 DIAGNOSIS — R079 Chest pain, unspecified: Secondary | ICD-10-CM | POA: Diagnosis present

## 2015-12-06 DIAGNOSIS — F419 Anxiety disorder, unspecified: Secondary | ICD-10-CM | POA: Insufficient documentation

## 2015-12-06 DIAGNOSIS — Z880 Allergy status to penicillin: Secondary | ICD-10-CM | POA: Insufficient documentation

## 2015-12-06 LAB — BASIC METABOLIC PANEL
Anion gap: 7 (ref 5–15)
BUN: 9 mg/dL (ref 6–20)
CHLORIDE: 102 mmol/L (ref 101–111)
CO2: 28 mmol/L (ref 22–32)
CREATININE: 0.84 mg/dL (ref 0.44–1.00)
Calcium: 9.3 mg/dL (ref 8.9–10.3)
GFR calc Af Amer: >60 mL/min (ref >60)
GFR calc non Af Amer: >60 mL/min (ref >60)
GLUCOSE: 103 mg/dL — AB (ref 65–99)
POTASSIUM: 3.6 mmol/L (ref 3.5–5.1)
Sodium: 137 mmol/L (ref 135–145)

## 2015-12-06 LAB — CBC
HEMATOCRIT: 41 % (ref 35.0–47.0)
Hemoglobin: 13.8 g/dL (ref 12.0–16.0)
MCH: 30.2 pg (ref 26.0–34.0)
MCHC: 33.6 g/dL (ref 32.0–36.0)
MCV: 89.9 fL (ref 80.0–100.0)
PLATELETS: 165 10*3/uL (ref 150–440)
RBC: 4.56 MIL/uL (ref 3.80–5.20)
RDW: 14 % (ref 11.5–14.5)
WBC: 6.1 10*3/uL (ref 3.6–11.0)

## 2015-12-06 LAB — TROPONIN I: Troponin I: <0.03 ng/mL (ref <0.031)

## 2015-12-06 MED ORDER — GABAPENTIN 400 MG PO CAPS
800.0000 mg | ORAL_CAPSULE | Freq: Once | ORAL | Status: AC
  Administered 2015-12-06: 800 mg via ORAL

## 2015-12-06 MED ORDER — PAROXETINE HCL 40 MG PO TABS: 40.0000 mg | ORAL_TABLET | Freq: Every day | ORAL | Status: DC

## 2015-12-06 MED ORDER — LORAZEPAM 1 MG PO TABS
1.0000 mg | ORAL_TABLET | Freq: Once | ORAL | Status: AC
  Administered 2015-12-06: 1 mg via ORAL
  Filled 2015-12-06: qty 1

## 2015-12-06 MED ORDER — LORAZEPAM 2 MG/ML IJ SOLN
1.0000 mg | Freq: Once | INTRAMUSCULAR | Status: AC
  Administered 2015-12-06: 1 mg via INTRAVENOUS
  Filled 2015-12-06: qty 1

## 2015-12-06 MED ORDER — LORAZEPAM 1 MG PO TABS: 1.0000 mg | ORAL_TABLET | Freq: Two times a day (BID) | ORAL | Status: DC | PRN

## 2015-12-06 MED ORDER — GABAPENTIN 400 MG PO CAPS
ORAL_CAPSULE | ORAL | Status: AC
  Administered 2015-12-06: 800 mg via ORAL
  Filled 2015-12-06: qty 2

## 2015-12-06 MED ORDER — LEVOTHYROXINE SODIUM 150 MCG PO TABS: 150.0000 ug | ORAL_TABLET | Freq: Every day | ORAL | Status: DC

## 2015-12-06 MED ORDER — GABAPENTIN 800 MG PO TABS: 800.0000 mg | ORAL_TABLET | Freq: Four times a day (QID) | ORAL | Status: DC

## 2015-12-06 NOTE — ED Notes (Signed)
Pt given open clinic and medication assistance of [place]

## 2015-12-06 NOTE — Discharge Instructions (Signed)
Nonspecific Chest Pain °It is often hard to find the cause of chest pain. There is always a chance that your pain could be related to something serious, such as a heart attack or a blood clot in your lungs. Chest pain can also be caused by conditions that are not life-threatening. If you have chest pain, it is very important to follow up with your doctor. ° °HOME CARE °· If you were prescribed an antibiotic medicine, finish it all even if you start to feel better. °· Avoid any activities that cause chest pain. °· Do not use any tobacco products, including cigarettes, chewing tobacco, or electronic cigarettes. If you need help quitting, ask your doctor. °· Do not drink alcohol. °· Take medicines only as told by your doctor. °· Keep all follow-up visits as told by your doctor. This is important. This includes any further testing if your chest pain does not go away. °· Your doctor may tell you to keep your head raised (elevated) while you sleep. °· Make lifestyle changes as told by your doctor. These may include: °¨ Getting regular exercise. Ask your doctor to suggest some activities that are safe for you. °¨ Eating a heart-healthy diet. Your doctor or a diet specialist (dietitian) can help you to learn healthy eating options. °¨ Maintaining a healthy weight. °¨ Managing diabetes, if necessary. °¨ Reducing stress. °GET HELP IF: °· Your chest pain does not go away, even after treatment. °· You have a rash with blisters on your chest. °· You have a fever. °GET HELP RIGHT AWAY IF: °· Your chest pain is worse. °· You have an increasing cough, or you cough up blood. °· You have severe belly (abdominal) pain. °· You feel extremely weak. °· You pass out (faint). °· You have chills. °· You have sudden, unexplained chest discomfort. °· You have sudden, unexplained discomfort in your arms, back, neck, or jaw. °· You have shortness of breath at any time. °· You suddenly start to sweat, or your skin gets clammy. °· You feel  nauseous. °· You vomit. °· You suddenly feel light-headed or dizzy. °· Your heart begins to beat quickly, or it feels like it is skipping beats. °These symptoms may be an emergency. Do not wait to see if the symptoms will go away. Get medical help right away. Call your local emergency services (911 in the [place] Do not drive yourself to the hospital. °  °This information is not intended to replace advice given to you by your health care provider. Make sure you discuss any questions you have with your health care provider. °  °Document Released: 05/02/2008 Document Revised: 12/05/2014 Document Reviewed: 06/20/2014 °Elsevier Interactive Patient Education ©2016 Elsevier Inc. ° °Please return immediately if condition worsens. Please contact her primary physician or the physician you were given for referral. If you have any specialist physicians involved in her treatment and plan please also contact them. Thank you for using [facility] emergency [facility]. ° °

## 2015-12-06 NOTE — ED Provider Notes (Signed)
Time Seen: Approximately ----------------------------------------- 7:33 PM on 12/06/2015 -----------------------------------------   I have reviewed the triage notes  Chief Complaint: Chest Pain   History of Present Illness: Caroline Coleman is a 60 y.o. female who presents with substernal intermittent chest discomfort associated with feeling anxious. Patient states she also feels the chest discomfort while smoking. She states the symptoms are very transient and calm and go for the last 48 hours. She states she feels anxious because she's been out of her medications for the past week which includes Paxil, Synthroid, gabapentin, and Xanax. She denies any radiation of the chest discomfort to the arm job back or flank area. She states his chest discomfort seems similar to what she's had episodes of anxiety before in the past. She states that she does not feel suicidal, homicidal, or having active hallucinations. She states her physician has left the area and she currently has no local primary physician at this time.   Past Medical History  Diagnosis Date  . Thyroid disease   . Anxiety   . Depression   . COPD (chronic obstructive pulmonary disease) (HCC)     There are no active problems to display for this patient.   Past Surgical History  Procedure Laterality Date  . Hip surgery    . Eye surgery      Past Surgical History  Procedure Laterality Date  . Hip surgery    . Eye surgery      Current Outpatient Rx  Name  Route  Sig  Dispense  Refill  . albuterol (PROVENTIL HFA;VENTOLIN HFA) 108 (90 BASE) MCG/ACT inhaler   Inhalation   Inhale 2 puffs into the lungs every 6 (six) hours as needed for wheezing or shortness of breath.   1 Inhaler   2   . ALPRAZolam (XANAX) 0.25 MG tablet   Oral   Take 1 tablet by mouth at bedtime.      5   . gabapentin (NEURONTIN) 400 MG capsule   Oral   Take 2 capsules (800 mg total) by mouth 2 (two) times daily.   60 capsule   0   .  levothyroxine (SYNTHROID, LEVOTHROID) 150 MCG tablet   Oral   Take 1 tablet (150 mcg total) by mouth daily.   30 tablet   0   . ondansetron (ZOFRAN-ODT) 4 MG disintegrating tablet   Oral   Take 1 tablet (4 mg total) by mouth once.   20 tablet   0   . pantoprazole (PROTONIX) 40 MG tablet   Oral   Take 1 tablet (40 mg total) by mouth daily.   30 tablet   0   . PARoxetine (PAXIL) 40 MG tablet   Oral   Take 1 tablet (40 mg total) by mouth daily.   30 tablet   0   . predniSONE (DELTASONE) 20 MG tablet      Take 2 tabs per day on day 1 and a 2 Take 1 tab per day on day 34 and 5.   7 tablet   0   . Spacer/Aero-Holding Chambers (OPTICHAMBER ADVANTAGE) MISC   Other   1 each by Other route once. Always uses her when you're using a metered-dose inhaler. You've aromatase medicine as much, he won't have his much side effect, but you it twice as much medicine and your lungs.   1 each   0     Allergies:  Penicillins and Sulfa antibiotics  Family History: No family history on file.  Social History: Social History  Substance Use Topics  . Smoking status: Current Every Day Smoker -- 1.00 packs/day    Types: Cigarettes  . Smokeless tobacco: None  . Alcohol Use: No     Review of Systems:   10 point review of systems was performed and was otherwise negative:  Constitutional: No fever Eyes: No visual disturbances ENT: No sore throat, ear pain Cardiac: No productive cough or wheezing. Patient's currently not on any home oxygen supplement. Respiratory: No shortness of breath, wheezing, or stridor Abdomen: No abdominal pain, no vomiting, No diarrhea Endocrine: No weight loss, No night sweats Extremities: No peripheral edema, cyanosis Skin: No rashes, easy bruising Neurologic: No focal weakness, trouble with speech or swollowing Urologic: No dysuria, Hematuria, or urinary frequency  Physical Exam:  ED Triage Vitals  Enc Vitals Group     BP 12/06/15 1909 112/73 mmHg      Pulse Rate 12/06/15 1909 87     Resp 12/06/15 1904 16     Temp 12/06/15 1904 98.4 F (36.9 C)     Temp Source 12/06/15 1904 Oral     SpO2 12/06/15 1909 99 %     Weight --      Height --      Head Cir --      Peak Flow --      Pain Score 12/06/15 1905 5     Pain Loc --      Pain Edu? --      Excl. in GC? --     General: Awake , Alert , and Oriented times 3; GCS 15. Patient is anxious but cooperative engages in conversation. Head: Normal cephalic , atraumatic Eyes: Pupils equal , round, reactive to light Nose/Throat: No nasal drainage, patent upper airway without erythema or exudate.  Neck: Supple, Full range of motion, No anterior adenopathy or palpable thyroid masses Lungs: Clear to ascultation without wheezes , rhonchi, or rales Heart: Regular rate, regular rhythm without murmurs , gallops , or rubs Abdomen: Soft, non tender without rebound, guarding , or rigidity; bowel sounds positive and symmetric in all 4 quadrants. No organomegaly .        Extremities: 2 plus symmetric pulses. No edema, clubbing or cyanosis Neurologic: normal ambulation, Motor symmetric without deficits, sensory intact Skin: warm, dry, no rashes   Labs:   All laboratory work was reviewed including any pertinent negatives or positives listed below:  Labs Reviewed  CBC  BASIC METABOLIC PANEL  TROPONIN I   review laboratory work showed no significant findings  EKG: * ED ECG REPORT I, Jennye MoccasinBrian S Quigley, the attending physician, personally viewed and interpreted this ECG.  Date: 12/06/2015 EKG Time: 1909 Rate: 87 Rhythm: normal sinus rhythm QRS Axis: normal Intervals: normal ST/T Wave abnormalities: normal Conduction Disutrbances: none Narrative Interpretation: unremarkable   Radiology: CLINICAL DATA: Chest pain since yesterday with pressure in shortness of breath.  EXAM: CHEST 2 VIEW  COMPARISON: PA and lateral chest 11/01/2015 and 08/17/2014  FINDINGS: The chest is hyperexpanded with  flattening of the hemidiaphragms, enlargement of the retrosternal airspace and attenuation of the pulmonary vasculature. Lungs are clear. Heart size is normal. No pneumothorax or pleural effusion. Remote healed left rib fractures are noted.  IMPRESSION: Findings compatible with COPD. No acute disease.       I personally reviewed the radiologic studies     ED Course:  Differential includes all life-threatening causes for chest pain. This includes but is not exclusive to acute coronary syndrome, aortic  dissection, pulmonary embolism, cardiac tamponade, community-acquired pneumonia, pericarditis, musculoskeletal chest wall pain, etc. Given the patient's current presentation and objective findings I felt most likely these were anxiety or stress related episodes. His symptoms seem to be brief in nature and have been occurring over the last 24-36 hours. EKG and enzymes do not show any indications of acute coronary syndrome. She also does not clinically present for a pulmonary embolism or other life-threatening causes for chest pain. Patient was symptomatically improved with Ativan here in emergency department and I prescribed her old for her routine medications. She was advised to return here if her symptoms worsen and to drink plenty of fluids.   Assessment: Acute unspecified chest pain Anxiety      Plan: * Outpatient management Patient was advised to return immediately if condition worsens. Patient was advised to follow up with their primary care physician or other specialized physicians involved in their outpatient care             Jennye Moccasin, MD 12/06/15 2253

## 2015-12-06 NOTE — ED Notes (Addendum)
Pt states she was told she had a blockage 3 years ago and hasn't followed up because her DR left, she also has thyroid issues and hasn't been taking meds for the same reason. Pt states she wants to talk to behav med to try and get back on her meds, "I think my chest is hurting because of all the anxiety and stress from not taking meds.." per pt. Pt tearful during triage, denies any SI/HI

## 2015-12-06 NOTE — ED Notes (Signed)
Pt ems from home, c/o chest pain since yesterday with pressure with sob. Pt has hx of blockage, EMS gave 324mg  asprin . Pt A&O

## 2015-12-13 ENCOUNTER — Encounter: Payer: Self-pay | Admitting: Emergency Medicine

## 2015-12-13 ENCOUNTER — Emergency Department
Admission: EM | Admit: 2015-12-13 | Discharge: 2015-12-14 | Disposition: A | Discharge status: Other Acute Inpatient Hospital | Payer: Medicaid Other | Attending: Emergency Medicine | Admitting: Emergency Medicine

## 2015-12-13 DIAGNOSIS — Z79899 Other long term (current) drug therapy: Secondary | ICD-10-CM | POA: Diagnosis not present

## 2015-12-13 DIAGNOSIS — R45851 Suicidal ideations: Secondary | ICD-10-CM | POA: Diagnosis present

## 2015-12-13 DIAGNOSIS — Z880 Allergy status to penicillin: Secondary | ICD-10-CM | POA: Insufficient documentation

## 2015-12-13 DIAGNOSIS — F32A Depression, unspecified: Secondary | ICD-10-CM

## 2015-12-13 DIAGNOSIS — Z7952 Long term (current) use of systemic steroids: Secondary | ICD-10-CM | POA: Diagnosis not present

## 2015-12-13 DIAGNOSIS — F4001 Agoraphobia with panic disorder: Secondary | ICD-10-CM

## 2015-12-13 DIAGNOSIS — G4700 Insomnia, unspecified: Secondary | ICD-10-CM

## 2015-12-13 DIAGNOSIS — F17210 Nicotine dependence, cigarettes, uncomplicated: Secondary | ICD-10-CM | POA: Insufficient documentation

## 2015-12-13 DIAGNOSIS — R51 Headache: Secondary | ICD-10-CM | POA: Insufficient documentation

## 2015-12-13 DIAGNOSIS — F329 Major depressive disorder, single episode, unspecified: Secondary | ICD-10-CM | POA: Diagnosis not present

## 2015-12-13 LAB — COMPREHENSIVE METABOLIC PANEL
ALBUMIN: 4.7 g/dL (ref 3.5–5.0)
ALK PHOS: 104 U/L (ref 38–126)
ALT: 14 U/L (ref 14–54)
AST: 20 U/L (ref 15–41)
Anion gap: 8 (ref 5–15)
BILIRUBIN TOTAL: 0.6 mg/dL (ref 0.3–1.2)
BUN: 10 mg/dL (ref 6–20)
CALCIUM: 9.9 mg/dL (ref 8.9–10.3)
CO2: 29 mmol/L (ref 22–32)
CREATININE: 0.78 mg/dL (ref 0.44–1.00)
Chloride: 102 mmol/L (ref 101–111)
GFR calc Af Amer: >60 mL/min (ref >60)
GFR calc non Af Amer: >60 mL/min (ref >60)
GLUCOSE: 83 mg/dL (ref 65–99)
Potassium: 4.1 mmol/L (ref 3.5–5.1)
SODIUM: 139 mmol/L (ref 135–145)
TOTAL PROTEIN: 8.2 g/dL — AB (ref 6.5–8.1)

## 2015-12-13 LAB — URINE DRUG SCREEN, QUALITATIVE (ARMC ONLY)
Amphetamines, Ur Screen: NOT DETECTED
Barbiturates, Ur Screen: NOT DETECTED
Benzodiazepine, Ur Scrn: NOT DETECTED
CANNABINOID 50 NG, UR ~~LOC~~: NOT DETECTED
COCAINE METABOLITE, UR ~~LOC~~: NOT DETECTED
MDMA (Ecstasy)Ur Screen: NOT DETECTED
METHADONE SCREEN, URINE: NOT DETECTED
OPIATE, UR SCREEN: NOT DETECTED
Phencyclidine (PCP) Ur S: NOT DETECTED
Tricyclic, Ur Screen: NOT DETECTED

## 2015-12-13 LAB — CBC
HEMATOCRIT: 42.8 % (ref 35.0–47.0)
HEMOGLOBIN: 14.3 g/dL (ref 12.0–16.0)
MCH: 30.5 pg (ref 26.0–34.0)
MCHC: 33.4 g/dL (ref 32.0–36.0)
MCV: 91.3 fL (ref 80.0–100.0)
Platelets: 171 10*3/uL (ref 150–440)
RBC: 4.68 MIL/uL (ref 3.80–5.20)
RDW: 14 % (ref 11.5–14.5)
WBC: 6.9 10*3/uL (ref 3.6–11.0)

## 2015-12-13 LAB — SALICYLATE LEVEL: Salicylate Lvl: <4 mg/dL (ref 2.8–30.0)

## 2015-12-13 LAB — ACETAMINOPHEN LEVEL

## 2015-12-13 LAB — ETHANOL: Alcohol, Ethyl (B): <5 mg/dL (ref <5)

## 2015-12-13 MED ORDER — ACETAMINOPHEN 500 MG PO TABS
1000.0000 mg | ORAL_TABLET | Freq: Once | ORAL | Status: AC
  Administered 2015-12-13: 1000 mg via ORAL
  Filled 2015-12-13: qty 2

## 2015-12-13 NOTE — ED Provider Notes (Signed)
[facility] Emergency Department Provider Note  ____________________________________________  Time seen: Approximately 10:50 PM  I have reviewed the triage vital signs and the nursing notes.   HISTORY  Chief Complaint Suicidal  HPI Caroline Coleman is a 60 y.o. female patient reports she feels helpless and hopeless and just overwhelmed. She thinks she might want to hurt herself. She does not have a plan at this time. She does have a history of anxiety and depression thyroid disease. Has in the old records a history of COPD which she neglected to tell me but she does say that she does not have any other active problems at this point.   Past Medical History  Diagnosis Date  . Thyroid disease   . Anxiety   . Depression   . COPD (chronic obstructive pulmonary disease) (HCC)     There are no active problems to display for this patient.   Past Surgical History  Procedure Laterality Date  . Hip surgery    . Eye surgery      Current Outpatient Rx  Name  Route  Sig  Dispense  Refill  . albuterol (PROVENTIL HFA;VENTOLIN HFA) 108 (90 BASE) MCG/ACT inhaler   Inhalation   Inhale 2 puffs into the lungs every 6 (six) hours as needed for wheezing or shortness of breath.   1 Inhaler   2   . ALPRAZolam (XANAX) 0.25 MG tablet   Oral   Take 1 tablet by mouth at bedtime.      5   . gabapentin (NEURONTIN) 800 MG tablet   Oral   Take 1 tablet (800 mg total) by mouth 4 (four) times daily.   120 tablet   0   . levothyroxine (SYNTHROID) 150 MCG tablet   Oral   Take 1 tablet (150 mcg total) by mouth daily.   30 tablet   11   . LORazepam (ATIVAN) 1 MG tablet   Oral   Take 1 tablet (1 mg total) by mouth 2 (two) times daily as needed for anxiety.   30 tablet   0   . ondansetron (ZOFRAN-ODT) 4 MG disintegrating tablet   Oral   Take 1 tablet (4 mg total) by mouth once.   20 tablet   0   . pantoprazole (PROTONIX) 40 MG tablet   Oral   Take 1 tablet  (40 mg total) by mouth daily.   30 tablet   0   . PARoxetine (PAXIL) 40 MG tablet   Oral   Take 1 tablet (40 mg total) by mouth daily.   30 tablet   2   . predniSONE (DELTASONE) 20 MG tablet      Take 2 tabs per day on day 1 and a 2 Take 1 tab per day on day 34 and 5.   7 tablet   0   . Spacer/Aero-Holding Chambers (OPTICHAMBER ADVANTAGE) MISC   Other   1 each by Other route once. Always uses her when you're using a metered-dose inhaler. You've aromatase medicine as much, he won't have his much side effect, but you it twice as much medicine and your lungs.   1 each   0     Allergies Penicillins and Sulfa antibiotics  History reviewed. No pertinent family history.  Social History Social History  Substance Use Topics  . Smoking status: Current Every Day Smoker -- 1.00 packs/day    Types: Cigarettes  . Smokeless tobacco: None  . Alcohol Use: No    Review  of Systems Constitutional: No fever/chills Eyes: No visual changes. ENT: No sore throat. Cardiovascular: Denies chest pain. Respiratory: Denies shortness of breath. Gastrointestinal: No abdominal pain.  No nausea, no vomiting.  No diarrhea.  No constipation. Genitourinary: Negative for dysuria. Musculoskeletal: Negative for back pain. Skin: Negative for rash. Neurological: Negative for  focal weakness or numbness.  10-point ROS otherwise negative.  ____________________________________________   PHYSICAL EXAM:  VITAL SIGNS: ED Triage Vitals  Enc Vitals Group     BP 12/13/15 2156 127/58 mmHg     Pulse Rate 12/13/15 2156 79     Resp 12/13/15 2156 18     Temp 12/13/15 2156 98.1 F (36.7 C)     Temp Source 12/13/15 2156 Oral     SpO2 12/13/15 2156 96 %     Weight 12/13/15 2156 170 lb (77.111 kg)     Height 12/13/15 2156 5\' 4"  (1.626 m)     Head Cir --      Peak Flow --      Pain Score 12/13/15 2157 0     Pain Loc --      Pain Edu? --      Excl. in GC? --     Constitutional: Alert and oriented.  Well appearing and in no acute distress but she looks sad. She says she has a mild headache and asked for some Tylenol. Eyes: Conjunctivae are normal. PERRL. EOMI. Head: Atraumatic. Nose: No congestion/rhinnorhea. Mouth/Throat: Mucous membranes are moist.  Oropharynx non-erythematous. Neck: No stridor.  Cardiovascular: Normal rate, regular rhythm. Grossly normal heart sounds.  Good peripheral circulation. Respiratory: Normal respiratory effort.  No retractions. Lungs CTAB. Gastrointestinal: Soft and nontender. No distention. No abdominal bruits. No CVA tenderness. Musculoskeletal: No lower extremity tenderness nor edema.  No joint effusions. Neurologic:  Normal speech and language. No gross focal neurologic deficits are appreciated.  Skin:  Skin is warm, dry and intact. No rash noted.   ____________________________________________   LABS (all labs ordered are listed, but only abnormal results are displayed)  Labs Reviewed  COMPREHENSIVE METABOLIC PANEL - Abnormal; Notable for the following:    Total Protein 8.2 (*)    All other components within normal limits  CBC  URINE DRUG SCREEN, QUALITATIVE ([facility] ONLY)  ETHANOL  SALICYLATE LEVEL  ACETAMINOPHEN LEVEL   ____________________________________________  EKG   ____________________________________________  RADIOLOGY   ____________________________________________   PROCEDURES   ____________________________________________   INITIAL IMPRESSION / ASSESSMENT AND PLAN / ED COURSE  Pertinent labs & imaging results that were available during my care of the patient were reviewed by me and considered in my medical decision making (see chart for details).   ____________________________________________   FINAL CLINICAL IMPRESSION(S) / ED DIAGNOSES  Final diagnoses:  Depressed      Arnaldo NatalPaul F Malinda, MD 12/13/15 2252

## 2015-12-13 NOTE — ED Notes (Addendum)
Pt says she has been depressed; "I can't go on like this"; gets upset easily; insomnia; tearful in triage; pt prescribed Paxil a week ago but did not fill because she doesn't feel like it helps any longer; adds she is out of her prescribed xanax

## 2015-12-14 DIAGNOSIS — G4700 Insomnia, unspecified: Secondary | ICD-10-CM

## 2015-12-14 DIAGNOSIS — F4001 Agoraphobia with panic disorder: Secondary | ICD-10-CM | POA: Diagnosis not present

## 2015-12-14 DIAGNOSIS — F32A Depression, unspecified: Secondary | ICD-10-CM

## 2015-12-14 DIAGNOSIS — F329 Major depressive disorder, single episode, unspecified: Secondary | ICD-10-CM

## 2015-12-14 MED ORDER — ALBUTEROL SULFATE HFA 108 (90 BASE) MCG/ACT IN AERS
2.0000 | INHALATION_SPRAY | RESPIRATORY_TRACT | Status: DC | PRN
  Administered 2015-12-14: 2 via RESPIRATORY_TRACT
  Filled 2015-12-14: qty 6.7

## 2015-12-14 MED ORDER — MIRTAZAPINE 15 MG PO TABS: 15.0000 mg | ORAL_TABLET | Freq: Every day | ORAL | Status: DC

## 2015-12-14 MED ORDER — GABAPENTIN 600 MG PO TABS
ORAL_TABLET | ORAL | Status: AC
  Administered 2015-12-14: 600 mg
  Filled 2015-12-14: qty 1

## 2015-12-14 MED ORDER — PAROXETINE HCL 40 MG PO TABS: 40.0000 mg | ORAL_TABLET | ORAL | Status: DC

## 2015-12-14 MED ORDER — GABAPENTIN 100 MG PO CAPS
ORAL_CAPSULE | ORAL | Status: AC
Start: 2015-12-14 — End: 2015-12-14
  Administered 2015-12-14: 800 mg via ORAL
  Filled 2015-12-14: qty 2

## 2015-12-14 MED ORDER — LEVOTHYROXINE SODIUM 100 MCG PO TABS
150.0000 ug | ORAL_TABLET | Freq: Every day | ORAL | Status: DC
  Administered 2015-12-14: 150 ug via ORAL
  Filled 2015-12-14: qty 2

## 2015-12-14 MED ORDER — GABAPENTIN 400 MG PO CAPS
800.0000 mg | ORAL_CAPSULE | Freq: Four times a day (QID) | ORAL | Status: DC
  Administered 2015-12-14: 800 mg via ORAL
  Filled 2015-12-14 (×3): qty 2

## 2015-12-14 MED ORDER — GABAPENTIN 300 MG PO CAPS
800.0000 mg | ORAL_CAPSULE | Freq: Three times a day (TID) | ORAL | Status: DC
  Administered 2015-12-14: 800 mg via ORAL
  Filled 2015-12-14: qty 2

## 2015-12-14 MED ORDER — DIPHENHYDRAMINE HCL 25 MG PO CAPS
50.0000 mg | ORAL_CAPSULE | Freq: Once | ORAL | Status: AC
  Administered 2015-12-14: 50 mg via ORAL
  Filled 2015-12-14: qty 2

## 2015-12-14 NOTE — ED Notes (Signed)
Pt resting in room. No complaints. No distress. Quiet and cooperative. Maintained on 15 minute checks and observation by security camera for safety.

## 2015-12-14 NOTE — ED Notes (Signed)
Pt ready for discharge. Called grandson for a ride.

## 2015-12-14 NOTE — ED Notes (Signed)
Patient resting quietly in room. No noted distress or abnormal behaviors noted. Will continue 15 minute checks and observation by security camera for safety. 

## 2015-12-14 NOTE — Consult Note (Signed)
[facility] Consult   Reason for Consult:  Consult for this 60 year old woman with a history of anxiety problems who presents to the emergency room requesting help with depression and anxiety Referring Physician:  Clearnce Hasten Patient Identification: Caroline Coleman MRN:  [id number] Principal Diagnosis: Panic disorder with agoraphobia Diagnosis:   Patient Active Problem List   Diagnosis Date Noted  . 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 Insomnia [G47.00] 12/14/2015  . Depression [F32.9] 12/14/2015    Total Time spent with patient: 45 minutes  Subjective:   Caroline Coleman is a 60 y.o. female patient admitted with "I came in here because I needed some help".  HPI:  Patient interviewed. Case reviewed with physician's assistant student who also interviewed patient. Chart reviewed labs reviewed. 60 year old woman came to the emergency room stating that her nerves were terrible and she was feeling depressed. Says that she feels jittery all the time. Paces back and forth in her house. Not sleeping well. Says that this is been going on for probably as much as a month since she ran out of her medicine. She had been taking Paxil mirtazapine and Xanax prior to that. Since being out of them she says her nerves are torn up. Denies having any acute suicidal thoughts. No homicidal ideation. No psychotic symptoms. Denies that she is abusing substances.  Social history: Patient lives with her daughter who also has mental health problems. Much activity.  Medical history: History of chronic pain and she takes Neurontin history of hypothyroidism.  Substance abuse history: Denies past history of substance abuse problems  Past Psychiatric History: Patient reports that she has a long history of depression and anxiety. Has been seen at mental health in the past. Claims that she most recently had been following up with Dr. Retta Mac. Patient says that she has had not had any  Xanax in over a month. We do have some documentation that she picked up a Xanax prescription in the first week of January. Patient has a distant history of suicide attempt 30 years ago nothing since then.  Risk to Self: Suicidal Ideation: Yes-Currently Present Suicidal Intent: No-Not Currently/Within Last 6 Months Is patient at risk for suicide?: No Suicidal Plan?: No Access to Means: No What has been your use of drugs/alcohol within the last 12 months?: denied usage How many times?: 0 Other Self Harm Risks: none Triggers for Past Attempts: Unknown Intentional Self Injurious Behavior: None Risk to Others: Homicidal Ideation: No Thoughts of Harm to Others: No Current Homicidal Intent: No Current Homicidal Plan: No Access to Homicidal Means: No Identified Victim: None reported History of harm to others?: No Assessment of Violence: None Noted Violent Behavior Description: denied Does patient have access to weapons?: No Criminal Charges Pending?: No Does patient have a court date: No Prior Inpatient Therapy: Prior Inpatient Therapy: No Prior Therapy Dates: n/a Prior Therapy Facilty/Provider(s): n/a Reason for Treatment: n/a Prior Outpatient Therapy: Prior Outpatient Therapy: No Prior Therapy Dates: n/a Prior Therapy Facilty/Provider(s): n/a Reason for Treatment: n/a Does patient have an ACCT team?: No Does patient have Intensive In-House Services?  : No Does patient have [facility] services? : No Does patient have P4[facility] services?: No  Past Medical History:  Past Medical History  Diagnosis Date  . Thyroid disease   . Anxiety   . Depression   . COPD (chronic obstructive pulmonary disease) [facility])     Past Surgical History  Procedure Laterality Date  . Hip surgery    . Eye  surgery     Family History: History reviewed. No pertinent family history. Family Psychiatric  History: States that her daughter with whom she lives also has mental health problems similar to hers Social  History:  History  Alcohol Use No     History  Drug Use No    Social History   Social History  . Marital Status: Legally Separated    Spouse Name: N/A  . Number of Children: N/A  . Years of Education: N/A   Social History Main Topics  . Smoking status: Current Every Day Smoker -- 1.00 packs/day    Types: Cigarettes  . Smokeless tobacco: None  . Alcohol Use: No  . Drug Use: No  . Sexual Activity: Not Asked   Other Topics Concern  . None   Social History Narrative   Additional Social History:    History of alcohol / drug use?: No history of alcohol / drug abuse                     Allergies:   Allergies  Allergen Reactions  . Penicillins Other (See Comments)  . Sulfa Antibiotics Other (See Comments)    Labs:  Results for orders placed or performed during the hospital encounter of 12/13/15 (from the past 48 hour(s))  Comprehensive metabolic panel     Status: Abnormal   Collection Time: 12/13/15 10:05 PM  Result Value Ref Range   Sodium 139 135 - 145 mmol/L   Potassium 4.1 3.5 - 5.1 mmol/L   Chloride 102 101 - 111 mmol/L   CO2 29 22 - 32 mmol/L   Glucose, Bld 83 65 - 99 mg/dL   BUN 10 6 - 20 mg/dL   Creatinine, Ser 0.78 0.44 - 1.00 mg/dL   Calcium 9.9 8.9 - 10.3 mg/dL   Total Protein 8.2 (H) 6.5 - 8.1 g/dL   Albumin 4.7 3.5 - 5.0 g/dL   AST 20 15 - 41 U/L   ALT 14 14 - 54 U/L   Alkaline Phosphatase 104 38 - 126 U/L   Total Bilirubin 0.6 0.3 - 1.2 mg/dL   GFR calc non Af Amer >60 >60 mL/min   GFR calc Af Amer >60 >60 mL/min    Comment: (NOTE) The eGFR has been calculated using the CKD EPI equation. This calculation has not been validated in all clinical situations. eGFR's persistently <60 mL/min signify possible Chronic Kidney Disease.    Anion gap 8 5 - 15  Ethanol (ETOH)     Status: None   Collection Time: 12/13/15 10:05 PM  Result Value Ref Range   Alcohol, Ethyl (B) <5 <5 mg/dL    Comment:        LOWEST DETECTABLE LIMIT FOR SERUM  ALCOHOL IS 5 mg/dL FOR MEDICAL PURPOSES ONLY   Salicylate level     Status: None   Collection Time: 12/13/15 10:05 PM  Result Value Ref Range   Salicylate Lvl <5.8 2.8 - 30.0 mg/dL  Acetaminophen level     Status: Abnormal   Collection Time: 12/13/15 10:05 PM  Result Value Ref Range   Acetaminophen (Tylenol), Serum <10 (L) 10 - 30 ug/mL    Comment:        THERAPEUTIC CONCENTRATIONS VARY SIGNIFICANTLY. A RANGE OF 10-30 ug/mL MAY BE AN EFFECTIVE CONCENTRATION FOR MANY PATIENTS. HOWEVER, SOME ARE BEST TREATED AT CONCENTRATIONS OUTSIDE THIS RANGE. ACETAMINOPHEN CONCENTRATIONS >150 ug/mL AT 4 HOURS AFTER INGESTION AND >50 ug/mL AT 12 HOURS AFTER INGESTION ARE OFTEN ASSOCIATED  WITH TOXIC REACTIONS.   CBC     Status: None   Collection Time: 12/13/15 10:05 PM  Result Value Ref Range   WBC 6.9 3.6 - 11.0 K/uL   RBC 4.68 3.80 - 5.20 MIL/uL   Hemoglobin 14.3 12.0 - 16.0 g/dL   HCT 42.8 35.0 - 47.0 %   MCV 91.3 80.0 - 100.0 fL   MCH 30.5 26.0 - 34.0 pg   MCHC 33.4 32.0 - 36.0 g/dL   RDW 14.0 11.5 - 14.5 %   Platelets 171 150 - 440 K/uL  Urine Drug Screen, Qualitative ([facility] only)     Status: None   Collection Time: 12/13/15 10:08 PM  Result Value Ref Range   Tricyclic, Ur Screen NONE DETECTED NONE DETECTED   Amphetamines, Ur Screen NONE DETECTED NONE DETECTED   MDMA (Ecstasy)Ur Screen NONE DETECTED NONE DETECTED   Cocaine Metabolite,Ur [place] NONE DETECTED NONE DETECTED   Opiate, Ur Screen NONE DETECTED NONE DETECTED   Phencyclidine (PCP) Ur S NONE DETECTED NONE DETECTED   Cannabinoid 50 Ng, Ur [place] NONE DETECTED NONE DETECTED   Barbiturates, Ur Screen NONE DETECTED NONE DETECTED   Benzodiazepine, Ur Scrn NONE DETECTED NONE DETECTED   Methadone Scn, Ur NONE DETECTED NONE DETECTED    Comment: (NOTE) 161  Tricyclics, urine               Cutoff 1000 ng/mL 200  Amphetamines, urine             Cutoff 1000 ng/mL 300  MDMA (Ecstasy), urine           Cutoff 500 ng/mL 400  Cocaine  Metabolite, urine       Cutoff 300 ng/mL 500  Opiate, urine                   Cutoff 300 ng/mL 600  Phencyclidine (PCP), urine      Cutoff 25 ng/mL 700  Cannabinoid, urine              Cutoff 50 ng/mL 800  Barbiturates, urine             Cutoff 200 ng/mL 900  Benzodiazepine, urine           Cutoff 200 ng/mL 1000 Methadone, urine                Cutoff 300 ng/mL 1100 1200 The urine drug screen provides only a preliminary, unconfirmed 1300 analytical test result and should not be used for non-medical 1400 purposes. Clinical consideration and professional judgment should 1500 be applied to any positive drug screen result due to possible 1600 interfering substances. A more specific alternate chemical method 1700 must be used in order to obtain a confirmed analytical result.  1800 Gas chromato graphy / mass spectrometry (GC/MS) is the preferred 1900 confirmatory method.     Current Facility-Administered Medications  Medication Dose Route Frequency Provider Last Rate Last Dose  . albuterol (PROVENTIL HFA;VENTOLIN HFA) 108 (90 Base) MCG/ACT inhaler 2 puff  2 puff Inhalation Q4H PRN Orbie Pyo, MD   2 puff at 12/14/15 0855  . gabapentin (NEURONTIN) capsule 800 mg  800 mg Oral TID Orbie Pyo, MD      . levothyroxine Wilmer Floor, LEVOTHROID) tablet 150 mcg  150 mcg Oral QAC breakfast Orbie Pyo, MD   150 mcg at 12/14/15 0960   Current Outpatient Prescriptions  Medication Sig Dispense Refill  . albuterol (PROVENTIL HFA;VENTOLIN HFA) 108 (90 BASE) MCG/ACT inhaler Inhale 2  puffs into the lungs every 6 (six) hours as needed for wheezing or shortness of breath. 1 Inhaler 2  . ALPRAZolam (XANAX) 0.25 MG tablet Take 1 tablet by mouth at bedtime.  5  . gabapentin (NEURONTIN) 800 MG tablet Take 1 tablet (800 mg total) by mouth 4 (four) times daily. 120 tablet 0  . levothyroxine (SYNTHROID) 150 MCG tablet Take 1 tablet (150 mcg total) by mouth daily. 30 tablet 11  .  LORazepam (ATIVAN) 1 MG tablet Take 1 tablet (1 mg total) by mouth 2 (two) times daily as needed for anxiety. 30 tablet 0  . mirtazapine (REMERON) 15 MG tablet Take 1 tablet (15 mg total) by mouth at bedtime. 30 tablet 0  . ondansetron (ZOFRAN-ODT) 4 MG disintegrating tablet Take 1 tablet (4 mg total) by mouth once. 20 tablet 0  . pantoprazole (PROTONIX) 40 MG tablet Take 1 tablet (40 mg total) by mouth daily. 30 tablet 0  . PARoxetine (PAXIL) 40 MG tablet Take 1 tablet (40 mg total) by mouth daily. 30 tablet 2  . PARoxetine (PAXIL) 40 MG tablet Take 1 tablet (40 mg total) by mouth every morning. 30 tablet 0  . predniSONE (DELTASONE) 20 MG tablet Take 2 tabs per day on day 1 and a 2 Take 1 tab per day on day 34 and 5. 7 tablet 0  . Spacer/Aero-Holding Chambers (OPTICHAMBER ADVANTAGE) MISC 1 each by Other route once. Always uses her when you're using a metered-dose inhaler. You've aromatase medicine as much, he won't have his much side effect, but you it twice as much medicine and your lungs. 1 each 0    Musculoskeletal: Strength & Muscle Tone: within normal limits Gait & Station: normal Patient leans: N/A  Psychiatric Specialty Exam: Review of Systems  Constitutional: Negative.   HENT: Negative.   Eyes: Negative.   Respiratory: Negative.   Cardiovascular: Negative.   Gastrointestinal: Negative.   Musculoskeletal: Negative.   Skin: Negative.   Neurological: Negative.   Psychiatric/Behavioral: Positive for depression. Negative for suicidal ideas, hallucinations, memory loss and substance abuse. The patient is nervous/anxious and has insomnia.     Blood pressure 91/30, pulse 79, temperature 98 F (36.7 C), temperature source Oral, resp. rate 18, height _0  (1.626 m), weight 77.111 kg (170 lb), SpO2 94 %.Body mass index is 29.17 kg/(m^2).  General Appearance: Disheveled  Eye Sport and exercise psychologist::  Fair  Speech:  Clear and Coherent  Volume:  Normal  Mood:  Dysphoric  Affect:  Constricted   Thought Process:  Goal Directed  Orientation:  Full (Time, Place, and Person)  Thought Content:  Negative  Suicidal Thoughts:  No  Homicidal Thoughts:  No  Memory:  Immediate;   Fair Recent;   Fair Remote;   Fair  Judgement:  Fair  Insight:  Fair  Psychomotor Activity:  Decreased  Concentration:  Fair  Recall:  [employer] of Knowledge:Fair  Language: Fair  Akathisia:  Negative  Handed:  Right  AIMS (if indicated):     Assets:  Communication Skills Desire for Improvement Housing Resilience  ADL's:  Intact  Cognition: WNL  Sleep:      Treatment Plan Summary: Medication management and Plan 60 year old woman gives a history of panic attacks probable panic disorder depressive symptoms poor sleep. Has been off her medicine for about a month. Patient is not reporting suicidal ideation or homicidal ideation she is not acting in a dangerous manner and is not psychotic. She does not require inpatient level psychiatric treatment. I  agreed to restart her mirtazapine 15 mg at night and proximal 1840 mg per day which she said she had previously been taking. She will be referred to [facility] for outpatient mental health treatment in the community and and encouraged to get in to see a provider as soon as possible.  Disposition: Patient does not meet criteria for psychiatric inpatient admission. Supportive therapy provided about ongoing stressors.  John Clapacs 12/14/2015 4:39 PM

## 2015-12-14 NOTE — ED Provider Notes (Signed)
-----------------------------------------   6:37 PM on 12/14/2015 -----------------------------------------  The patient has been seen by Dr. Toni Amendlapacs of psychiatry. He is rescinded the involuntary commitment order and reports she may be discharged from the emergency department, that she does not require hospitalization for psychiatric reasons.  She will continue with Paxil and Remeron and she has a planned follow-up with [facility].   Darien Ramusavid W Lania Zawistowski, MD 12/14/15 Paulo [id number]

## 2015-12-14 NOTE — ED Notes (Addendum)
Pt came to unit due to increased depression x 1 month. Pt stressors include son in prison and having difficulty with daughter. Pt has been off her meds 6 months. Pt currently SI, but contracts for safety. Pt oriented to Psych unit and given sleep medication. Pt appears to be in no distress at this time.

## 2015-12-14 NOTE — ED Notes (Signed)
ENVIRONMENTAL ASSESSMENT  Potentially harmful objects out of patient reach: Yes.  Personal belongings secured: Yes.  Patient dressed in hospital provided attire only: Yes.  Plastic bags out of patient reach: Yes.  Patient care equipment (cords, cables, call bells, lines, and drains) shortened, removed, or accounted for: Yes.  Equipment and supplies removed from bottom of stretcher: Yes.  Potentially toxic materials out of patient reach: Yes.  Sharps container removed or out of patient reach: Yes.  BEHAVIORAL HEALTH ROUNDING  Patient sleeping: No.  Patient alert and oriented: yes  Behavior appropriate: Yes. ; If no, describe:  Nutrition and fluids offered: Yes  Toileting and hygiene offered: Yes  Sitter present: not applicable, Q 15 min safety rounds and observation. Law enforcement present: Yes ODS  ED BHU PLACEMENT JUSTIFICATION  Is the patient under IVC or is there intent for IVC: Yes.  Is the patient medically cleared: Yes.  Is there vacancy in the ED BHU: Yes.  Is the population mix appropriate for patient: Yes.  Is the patient awaiting placement in inpatient or outpatient setting: Yes.  Has the patient had a psychiatric consult: Yes.  Survey of unit performed for contraband, proper placement and condition of furniture, tampering with fixtures in bathroom, shower, and each patient room: Yes. ; Findings: All clear  APPEARANCE/BEHAVIOR  calm, cooperative and adequate rapport can be established  NEURO ASSESSMENT  Orientation: time, place and person  Hallucinations: No.None noted (Hallucinations)  Speech: Normal  Gait: normal  RESPIRATORY ASSESSMENT  WNL  CARDIOVASCULAR ASSESSMENT  WNL  GASTROINTESTINAL ASSESSMENT  WNL  EXTREMITIES  WNL  PLAN OF CARE  Provide calm/safe environment. Vital signs assessed twice daily. ED BHU Assessment once each 12-hour shift. Collaborate with intake RN daily or as condition indicates. Assure the ED provider has rounded once each shift.  Provide and encourage hygiene. Provide redirection as needed. Assess for escalating behavior; address immediately and inform ED provider.  Assess family dynamic and appropriateness for visitation as needed: Yes. ; If necessary, describe findings:  Educate the patient/family about BHU procedures/visitation: Yes. ; If necessary, describe findings: Pt is calm and cooperative at this time. Pt understanding and accepting of unit procedures/rules. Will continue to monitor with Q 15 min safety rounds and observation.      

## 2015-12-14 NOTE — ED Notes (Signed)
Pt in room speaking with psychiatrist assistant. Maintained on 15 minute checks and observation by security camera for safety.

## 2015-12-14 NOTE — ED Notes (Signed)
Pt denies pain, SI/HI and AVH. Pt stated she is overwhelmed by all that is going on  with her children. ER MD has ordered her synthroid and inhaler. Maintained on 15 minute checks and observation by security camera for safety.

## 2015-12-14 NOTE — ED Notes (Signed)
Pt in dayroom. Pt anxious to talk to psychiatrist. No complaints. No distress. Maintained on 15 minute checks and observation by security camera for safety.

## 2015-12-14 NOTE — ED Notes (Signed)
Patient asleep in room. No noted distress or abnormal behavior. Will continue 15 minute checks and observation by security cameras for safety. 

## 2015-12-14 NOTE — ED Notes (Signed)
Patient asleep in room. No noted distress or abnormal behavior. Will continue 15 minute checks and observation by security cameras for safety.  Pt's daughter Caroline Coleman called. Pt will be allowed to call her daughter back, if she wishes, at 5pm (phone hours).

## 2015-12-14 NOTE — BH Assessment (Signed)
Assessment Note  Caroline Coleman is an 60 y.o. female. Caroline Coleman reports that she came to the ED due to depression and nerves.  She states that she could not be still and real funny feeling, like somebody could not understand that feeling. Having outbursts of crying. Feelings of helplessness and hopelessness are expressed.  Feelings of unknown sadness. Some worrying has been occurring. She reports that the little things that made her happy no longer make her happy.  She states that the symptoms have been building up for about a month. She expressed that she is feeling overwhelmed. She reports that her heart was racing, could not breathe, and shaking real bad. She denied having auditory or visual hallucinations. She reports that she is feeling suicidal, She denied having a plan. She denied homicidal ideation or intent.  She reports having problems falling asleep and staying asleep.  She reports that her appetite varied.  Diagnosis: Depression  Past Medical History:  Past Medical History  Diagnosis Date  . Thyroid disease   . Anxiety   . Depression   . COPD (chronic obstructive pulmonary disease) [facility])     Past Surgical History  Procedure Laterality Date  . Hip surgery    . Eye surgery      Family History: History reviewed. No pertinent family history.  Social History:  reports that she has been smoking Cigarettes.  She has been smoking about 1.00 pack per day. She does not have any smokeless tobacco history on file. She reports that she does not drink alcohol or use illicit drugs.  Additional Social History:  Alcohol / Drug Use History of alcohol / drug use?: No history of alcohol / drug abuse  CIWA: CIWA-Ar BP: (!) 127/58 mmHg Pulse Rate: 79 COWS:    Allergies:  Allergies  Allergen Reactions  . Penicillins Other (See Comments)  . Sulfa Antibiotics Other (See Comments)    Home Medications:  (Not in a hospital admission)  OB/GYN Status:  No LMP recorded. Patient is  postmenopausal.  General Assessment Data Location of Assessment: [facility] ED TTS Assessment: In system Is this a Tele or Face-to-Face Assessment?: Face-to-Face Is this an Initial Assessment or a Re-assessment for this encounter?: Initial Assessment Marital status: Separated Maiden name: Renae Gloss Is patient pregnant?: No Pregnancy Status: No Living Arrangements:  (Daughter) Can pt return to current living arrangement?: Yes Admission Status: Involuntary Is patient capable of signing voluntary admission?: Yes Referral Source: Self/Family/Friend Insurance type: Medicaid  Medical Screening Exam [facility] Walk-in ONLY) Medical Exam completed: Yes  Crisis Care Plan Living Arrangements:  (Daughter) Legal Guardian: Other: (Self) Name of Psychiatrist: Denied Name of Therapist: Denied  Education Status Is patient currently in school?: No Current Grade: n/a Highest grade of school patient has completed: 10th Name of school: [employer], [place] Contact person: n/a  Risk to self with the past 6 months Suicidal Ideation: Yes-Currently Present Has patient been a risk to self within the past 6 months prior to admission? : Yes Suicidal Intent: No-Not Currently/Within Last 6 Months Has patient had any suicidal intent within the past 6 months prior to admission? : No Is patient at risk for suicide?: No Suicidal Plan?: No Has patient had any suicidal plan within the past 6 months prior to admission? : No Access to Means: No What has been your use of drugs/alcohol within the last 12 months?: denied usage Previous Attempts/Gestures: No How many times?: 0 Other Self Harm Risks: none Triggers for Past Attempts: Unknown  Intentional Self Injurious Behavior: None Family Suicide History: No Recent stressful life event(s):  (denied) Persecutory voices/beliefs?: No Depression: Yes Depression Symptoms: Feeling worthless/self pity, Isolating, Insomnia Substance abuse history and/or treatment  for substance abuse?: No Suicide prevention information given to non-admitted patients: Not applicable  Risk to Others within the past 6 months Homicidal Ideation: No Does patient have any lifetime risk of violence toward others beyond the six months prior to admission? : No Thoughts of Harm to Others: No Current Homicidal Intent: No Current Homicidal Plan: No Access to Homicidal Means: No Identified Victim: None reported History of harm to others?: No Assessment of Violence: None Noted Violent Behavior Description: denied Does patient have access to weapons?: No Criminal Charges Pending?: No Does patient have a court date: No Is patient on probation?: No  Psychosis Hallucinations: None noted Delusions: None noted  Mental Status Report Appearance/Hygiene: Unremarkable, In scrubs Eye Contact: Good Motor Activity: Unremarkable Speech: Logical/coherent Level of Consciousness: Alert Mood: Sad Affect: Flat Anxiety Level: Minimal Thought Processes: Coherent Judgement: Unimpaired Orientation: Place, Person, Situation, Time Obsessive Compulsive Thoughts/Behaviors: None  Cognitive Functioning Concentration: Decreased Memory: Recent Intact IQ: Average Insight: Fair Impulse Control: Good Appetite: Poor Sleep: Decreased Vegetative Symptoms: None  ADLScreening [facility] Assessment Services) Patient's cognitive ability adequate to safely complete daily activities?: Yes Patient able to express need for assistance with ADLs?: Yes Independently performs ADLs?: Yes (appropriate for developmental age)  Prior Inpatient Therapy Prior Inpatient Therapy: No Prior Therapy Dates: n/a Prior Therapy Facilty/Provider(s): n/a Reason for Treatment: n/a  Prior Outpatient Therapy Prior Outpatient Therapy: No Prior Therapy Dates: n/a Prior Therapy Facilty/Provider(s): n/a Reason for Treatment: n/a Does patient have an ACCT team?: No Does patient have Intensive In-House Services?  : No Does  patient have [facility] services? : No Does patient have P4CC services?: No  ADL Screening (condition at time of admission) Patient's cognitive ability adequate to safely complete daily activities?: Yes Patient able to express need for assistance with ADLs?: Yes Independently performs ADLs?: Yes (appropriate for developmental age)       Abuse/Neglect Assessment (Assessment to be complete while patient is alone) Physical Abuse: Denies Verbal Abuse: Denies Sexual Abuse: Denies Exploitation of patient/patient's resources: Denies Self-Neglect: Denies Values / Beliefs Cultural Requests During Hospitalization: None Spiritual Requests During Hospitalization: None   Advance Directives (For Healthcare) Does patient have an advance directive?: No    Additional Information 1:1 In Past 12 Months?: No CIRT Risk: No Elopement Risk: No Does patient have medical clearance?: Yes     Disposition:  Disposition Initial Assessment Completed for this Encounter: Yes Disposition of Patient: Other dispositions  On Site Evaluation by:   Reviewed with Physician:    Justice DeedsKeisha Daliana Leverett 12/14/2015 12:53 AM

## 2015-12-14 NOTE — ED Notes (Signed)
Patient complaining on pain in her hips and legs. MD notified. Gabapentin ordered. Cooperative, calm. No distress. Maintained on 15 minute checks and observation by security camera for safety.

## 2015-12-14 NOTE — Discharge Instructions (Signed)
Follow-up with [facility]. Return to emergency department if you have any thoughts of self-harm or other urgent concerns.

## 2015-12-14 NOTE — ED Notes (Signed)
Pt discharged with grandson. All belongings sent with pt. Pt denies SI/HI and AVH. Pt denies pain.

## 2015-12-14 NOTE — ED Notes (Signed)
Pt will be discharged home. Pt was informed her daughter had called. Pt stated, "I may call her back later tonight." Pt cooperative. Maintained on 15 minute checks and observation by security camera for safety.

## 2015-12-14 NOTE — ED Notes (Signed)
Report called to Casimiro NeedleMichael, [id number]

## 2015-12-14 NOTE — ED Notes (Signed)
BEHAVIORAL HEALTH ROUNDING  Patient sleeping: No.  Patient alert and oriented: yes  Behavior appropriate: Yes. ; If no, describe:  Nutrition and fluids offered: Yes  Toileting and hygiene offered: Yes  Sitter present: not applicable, Q 15 min safety rounds and observation.  Law enforcement present: Yes ODS  

## 2016-04-07 ENCOUNTER — Emergency Department: Payer: Medicaid Other

## 2016-04-07 ENCOUNTER — Emergency Department
Admission: EM | Admit: 2016-04-07 | Discharge: 2016-04-08 | Disposition: A | Discharge status: Home / Self Care | Payer: Medicaid Other | Attending: Emergency Medicine | Admitting: Emergency Medicine

## 2016-04-07 DIAGNOSIS — R609 Edema, unspecified: Secondary | ICD-10-CM

## 2016-04-07 DIAGNOSIS — J449 Chronic obstructive pulmonary disease, unspecified: Secondary | ICD-10-CM | POA: Diagnosis not present

## 2016-04-07 DIAGNOSIS — Z79899 Other long term (current) drug therapy: Secondary | ICD-10-CM | POA: Diagnosis not present

## 2016-04-07 DIAGNOSIS — F17210 Nicotine dependence, cigarettes, uncomplicated: Secondary | ICD-10-CM | POA: Diagnosis not present

## 2016-04-07 DIAGNOSIS — R109 Unspecified abdominal pain: Secondary | ICD-10-CM | POA: Diagnosis not present

## 2016-04-07 DIAGNOSIS — F329 Major depressive disorder, single episode, unspecified: Secondary | ICD-10-CM | POA: Diagnosis not present

## 2016-04-07 DIAGNOSIS — M545 Low back pain: Secondary | ICD-10-CM

## 2016-04-07 DIAGNOSIS — R600 Localized edema: Secondary | ICD-10-CM | POA: Diagnosis not present

## 2016-04-07 LAB — URINALYSIS COMPLETE WITH MICROSCOPIC (ARMC ONLY)
Bilirubin Urine: NEGATIVE
GLUCOSE, UA: NEGATIVE mg/dL
KETONES UR: NEGATIVE mg/dL
Leukocytes, UA: NEGATIVE
Nitrite: NEGATIVE
PROTEIN: NEGATIVE mg/dL
SPECIFIC GRAVITY, URINE: 1.003 — AB (ref 1.005–1.030)
pH: 5 (ref 5.0–8.0)

## 2016-04-07 LAB — CBC WITH DIFFERENTIAL/PLATELET
BASOS ABS: 0.1 10*3/uL (ref 0–0.1)
Eosinophils Absolute: 0.2 10*3/uL (ref 0–0.7)
HEMATOCRIT: 39.1 % (ref 35.0–47.0)
HEMOGLOBIN: 13 g/dL (ref 12.0–16.0)
Lymphs Abs: 1.9 10*3/uL (ref 1.0–3.6)
MCH: 29.1 pg (ref 26.0–34.0)
MCHC: 33.4 g/dL (ref 32.0–36.0)
MCV: 87.1 fL (ref 80.0–100.0)
Monocytes Absolute: 0.6 10*3/uL (ref 0.2–0.9)
Monocytes Relative: 8 %
NEUTROS ABS: 5.1 10*3/uL (ref 1.4–6.5)
Neutrophils Relative %: 64 %
Platelets: 154 10*3/uL (ref 150–440)
RBC: 4.48 MIL/uL (ref 3.80–5.20)
RDW: 15.1 % — AB (ref 11.5–14.5)
WBC: 7.9 10*3/uL (ref 3.6–11.0)

## 2016-04-07 LAB — COMPREHENSIVE METABOLIC PANEL
ALBUMIN: 4 g/dL (ref 3.5–5.0)
ALK PHOS: 86 U/L (ref 38–126)
ALT: 41 U/L (ref 14–54)
AST: 83 U/L — AB (ref 15–41)
Anion gap: 6 (ref 5–15)
BILIRUBIN TOTAL: 1 mg/dL (ref 0.3–1.2)
BUN: 7 mg/dL (ref 6–20)
CALCIUM: 9 mg/dL (ref 8.9–10.3)
CO2: 27 mmol/L (ref 22–32)
CREATININE: 0.98 mg/dL (ref 0.44–1.00)
Chloride: 100 mmol/L — ABNORMAL LOW (ref 101–111)
GFR calc Af Amer: >60 mL/min (ref >60)
GLUCOSE: 114 mg/dL — AB (ref 65–99)
POTASSIUM: 3.8 mmol/L (ref 3.5–5.1)
Sodium: 133 mmol/L — ABNORMAL LOW (ref 135–145)
TOTAL PROTEIN: 6.9 g/dL (ref 6.5–8.1)

## 2016-04-07 LAB — TROPONIN I: Troponin I: <0.03 ng/mL (ref <0.031)

## 2016-04-07 MED ORDER — MORPHINE SULFATE (PF) 2 MG/ML IV SOLN
2.0000 mg | Freq: Once | INTRAVENOUS | Status: AC
  Administered 2016-04-07: 2 mg via INTRAVENOUS
  Filled 2016-04-07: qty 1

## 2016-04-07 NOTE — ED Provider Notes (Addendum)
[facility] Emergency Department Provider Note        Time seen: ----------------------------------------- 9:48 PM on 04/07/2016 -----------------------------------------    I have reviewed the triage vital signs and the nursing notes.   HISTORY  Chief Complaint Back Pain and Leg Swelling    HPI Caroline Coleman is a 60 y.o. female who presents to ER for low back pain that radiates around both flanks, lower extremity swelling and dark urine. [name], the back pain is new. Patient states she's run out of all of her medications because her doctor can prescribe them anymore. She denies any recent illness.   Past Medical History  Diagnosis Date  . Thyroid disease   . Anxiety   . Depression   . COPD (chronic obstructive pulmonary disease) [facility])     Patient Active Problem List   Diagnosis Date Noted  . Panic disorder with agoraphobia 12/14/2015  . Insomnia 12/14/2015  . Depression 12/14/2015    Past Surgical History  Procedure Laterality Date  . Hip surgery    . Eye surgery      Allergies Penicillins and Sulfa antibiotics  Social History Social History  Substance Use Topics  . Smoking status: Current Every Day Smoker -- 1.00 packs/day    Types: Cigarettes  . Smokeless tobacco: None  . Alcohol Use: No    Review of Systems Constitutional: Negative for fever. Eyes: Negative for visual changes. ENT: Negative for sore throat. Cardiovascular: Negative for chest pain. Respiratory: Negative for shortness of breath. Gastrointestinal: Negative for abdominal pain, vomiting and diarrhea. Genitourinary: Positive for dark urine Musculoskeletal: Positive for back pain, lower extremity edema Skin: Negative for rash. Neurological: Negative for headaches, focal weakness or numbness.  10-point ROS otherwise negative.  ____________________________________________   PHYSICAL EXAM:  VITAL SIGNS: ED  Triage Vitals  Enc Vitals Group     BP 04/07/16 2145 99/62 mmHg     Pulse Rate 04/07/16 2145 95     Resp 04/07/16 2145 18     Temp 04/07/16 2145 98.3 F (36.8 C)     Temp Source 04/07/16 2145 Oral     SpO2 04/07/16 2145 95 %     Weight 04/07/16 2145 160 lb (72.576 kg)     Height 04/07/16 2145  (1.626 m)     Head Cir --      Peak Flow --      Pain Score 04/07/16 2146 10     Pain Loc --      Pain Edu? --      Excl. in GC? --     Constitutional: Alert and oriented. Generally chronically ill-appearing, no acute distress Eyes: Conjunctivae are normal. PERRL. Normal extraocular movements. ENT   Head: Normocephalic and atraumatic.   Nose: No congestion/rhinnorhea.   Mouth/Throat: Mucous membranes are moist.   Neck: No stridor. Cardiovascular: Normal rate, regular rhythm. No murmurs, rubs, or gallops. Respiratory: Normal respiratory effort without tachypnea nor retractions. Breath sounds are clear and equal bilaterally. No wheezes/rales/rhonchi. Gastrointestinal: Soft and nontender. Normal bowel sounds Musculoskeletal: Nontender with normal range of motion in all extremities. Bilateral lower extremity edema Neurologic:  Normal speech and language. No gross focal neurologic deficits are appreciated.  Skin:  Skin is warm, dry and intact. No rash noted. Psychiatric: Mood and affect are normal. Speech and behavior are normal.   ____________________________________________  ED COURSE:  Pertinent labs & imaging results that were available during my care of the patient  were reviewed by me and considered in my medical decision making (see chart for details). Patient is in no acute distress, symptoms are likely chronic. I will check basic labs and reevaluate.  ____________________________________________    LABS (pertinent positives/negatives)  Labs Reviewed  CBC WITH DIFFERENTIAL/PLATELET - Abnormal; Notable for the following:    RDW 15.1 (*)    All other components  within normal limits  COMPREHENSIVE METABOLIC PANEL - Abnormal; Notable for the following:    Sodium 133 (*)    Chloride 100 (*)    Glucose, Bld 114 (*)    AST 83 (*)    All other components within normal limits  URINALYSIS COMPLETEWITH MICROSCOPIC ([facility] ONLY) - Abnormal; Notable for the following:    Color, Urine STRAW (*)    APPearance CLEAR (*)    Specific Gravity, Urine 1.003 (*)    Hgb urine dipstick 1+ (*)    Bacteria, UA RARE (*)    Squamous Epithelial / LPF 0-5 (*)    All other components within normal limits  TROPONIN I    RADIOLOGY  CT renal protocol is pending   EKG: Sinus rhythm with rate of 87 bpm, normal PR interval, normal QRS, long QT interval. Normal axis. Possible septal infarct age indeterminate ____________________________________________  FINAL ASSESSMENT AND PLAN  Flank pain, edema  Plan: Patient with labs and imaging as dictated above. Patient is in no acute distress, her labs are grossly unremarkable. Patient care checked out to Dr. Duke Salviaandolph.   Emily FilbertWilliams, Eudell Mcphee E, MD   Note: This dictation was prepared with Dragon dictation. Any transcriptional errors that result from this process are unintentional   Emily FilbertJonathan E Kayveon Lennartz, MD 04/07/16 [id number]  Emily FilbertJonathan E Athol Bolds, MD 04/19/16 [phone]

## 2016-04-07 NOTE — ED Notes (Addendum)
Pt arrives to ED via [facility] from home for c/o bilateral lower back pain (new onset). Pt also reports bilateral lower extremity swelling with dysuria (dark yellow urine). EMS reports as VSS en route. Pt arrives A&O, in NAD, with respirations even, regular, and unlabored.

## 2016-04-08 MED ORDER — PAROXETINE HCL 40 MG PO TABS: 40.0000 mg | ORAL_TABLET | Freq: Every day | ORAL | Status: DC

## 2016-04-08 MED ORDER — PANTOPRAZOLE SODIUM 40 MG PO TBEC: 40.0000 mg | DELAYED_RELEASE_TABLET | Freq: Every day | ORAL | Status: DC

## 2016-04-08 MED ORDER — GABAPENTIN 800 MG PO TABS: 800.0000 mg | ORAL_TABLET | Freq: Two times a day (BID) | ORAL | Status: DC

## 2016-04-08 NOTE — ED Provider Notes (Signed)
Assumed care of the patient 11:30 PM from Dr. Mayford KnifeWilliams. Patient complains of low back pain as well as bilateral lower extremity discomfort. Patient denies any dysuria no fever no nausea no vomiting. She states that she has ran out of all of her medications including her Xanax's and Tresa GarterSoma because her doctor can no longer prescribe them for her. Patient is requesting refill of those medications at this time. Laboratory data CT scanperformed revealed no gross abnormality.  Darci Currentandolph N Arthi Mcdonald, MD 04/08/16 [phone]

## 2016-04-08 NOTE — Discharge Instructions (Signed)
Back Pain, Adult °Back pain is very common in adults. The cause of back pain is rarely dangerous and the pain often gets better over time. The cause of your back pain may not be known. Some common causes of back pain include: °· Strain of the muscles or ligaments supporting the spine. °· Wear and tear (degeneration) of the spinal disks. °· Arthritis. °· Direct injury to the back. °For many people, back pain may return. Since back pain is rarely dangerous, most people can learn to manage this condition on their own. °HOME CARE INSTRUCTIONS °Watch your back pain for any changes. The following actions may help to lessen any discomfort you are feeling: °· Remain active. It is stressful on your back to sit or stand in one place for long periods of time. Do not sit, drive, or stand in one place for more than 30 minutes at a time. Take short walks on even surfaces as soon as you are able. Try to increase the length of time you walk each day. °· Exercise regularly as directed by your health care provider. Exercise helps your back heal faster. It also helps avoid future injury by keeping your muscles strong and flexible. °· Do not stay in bed. Resting more than 1-2 days can delay your recovery. °· Pay attention to your body when you bend and lift. The most comfortable positions are those that put less stress on your recovering back. Always use proper lifting techniques, including: °¨ Bending your knees. °¨ Keeping the load close to your body. °¨ Avoiding twisting. °· Find a comfortable position to sleep. Use a firm mattress and lie on your side with your knees slightly bent. If you lie on your back, put a pillow under your knees. °· Avoid feeling anxious or stressed. Stress increases muscle tension and can worsen back pain. It is important to recognize when you are anxious or stressed and learn ways to manage it, such as with exercise. °· Take medicines only as directed by your health care provider. Over-the-counter  medicines to reduce pain and inflammation are often the most helpful. Your health care provider may prescribe muscle relaxant drugs. These medicines help dull your pain so you can more quickly return to your normal activities and healthy exercise. °· Apply ice to the injured area: °¨ Put ice in a plastic bag. °¨ Place a towel between your skin and the bag. °¨ Leave the ice on for 20 minutes, 2-3 times a day for the first 2-3 days. After that, ice and heat may be alternated to reduce pain and spasms. °· Maintain a healthy weight. Excess weight puts extra stress on your back and makes it difficult to maintain good posture. °SEEK MEDICAL CARE IF: °· You have pain that is not relieved with rest or medicine. °· You have increasing pain going down into the legs or buttocks. °· You have pain that does not improve in one week. °· You have night pain. °· You lose weight. °· You have a fever or chills. °SEEK IMMEDIATE MEDICAL CARE IF:  °· You develop new bowel or bladder control problems. °· You have unusual weakness or numbness in your arms or legs. °· You develop nausea or vomiting. °· You develop abdominal pain. °· You feel faint. °  °This information is not intended to replace advice given to you by your health care provider. Make sure you discuss any questions you have with your health care provider. °  °Document Released: 11/14/2005 Document Revised: 12/05/2014 Document Reviewed: 03/18/2014 °Elsevier Interactive Patient Education ©2016 [employer]   [employer] ° °Peripheral Edema °You have swelling in your legs (peripheral edema). This swelling is due to excess accumulation of salt and water in your body. Edema may be a sign of heart, kidney or liver disease, or a side effect of a medication. It may also be due to problems in the leg veins. Elevating your legs and using special support stockings may be very helpful, if the cause of the swelling is due to poor venous circulation. Avoid long periods of standing, whatever the  cause. °Treatment of edema depends on identifying the cause. Chips, pretzels, pickles and other salty foods should be avoided. Restricting salt in your diet is almost always needed. Water pills (diuretics) are often used to remove the excess salt and water from your body via urine. These medicines prevent the kidney from reabsorbing sodium. This increases urine flow. °Diuretic treatment may also result in lowering of potassium levels in your body. Potassium supplements may be needed if you have to use diuretics daily. Daily weights can help you keep track of your progress in clearing your edema. You should call your caregiver for follow up care as recommended. °SEEK IMMEDIATE MEDICAL CARE IF:  °· You have increased swelling, pain, redness, or heat in your legs. °· You develop shortness of breath, especially when lying down. °· You develop chest or abdominal pain, weakness, or fainting. °· You have a fever. °  °This information is not intended to replace advice given to you by your health care provider. Make sure you discuss any questions you have with your health care provider. °  °Document Released: 12/22/2004 Document Revised: 02/06/2012 Document Reviewed: 05/27/2015 °Elsevier Interactive Patient Education ©2016 [employer] Inc. ° °

## 2016-07-17 ENCOUNTER — Emergency Department
Admission: EM | Admit: 2016-07-17 | Discharge: 2016-07-17 | Disposition: A | Discharge status: Home / Self Care | Payer: Medicaid Other | Attending: Emergency Medicine | Admitting: Emergency Medicine

## 2016-07-17 DIAGNOSIS — L02412 Cutaneous abscess of left axilla: Secondary | ICD-10-CM | POA: Insufficient documentation

## 2016-07-17 DIAGNOSIS — Z79899 Other long term (current) drug therapy: Secondary | ICD-10-CM | POA: Diagnosis not present

## 2016-07-17 DIAGNOSIS — J449 Chronic obstructive pulmonary disease, unspecified: Secondary | ICD-10-CM | POA: Insufficient documentation

## 2016-07-17 DIAGNOSIS — F17210 Nicotine dependence, cigarettes, uncomplicated: Secondary | ICD-10-CM | POA: Diagnosis not present

## 2016-07-17 DIAGNOSIS — L0291 Cutaneous abscess, unspecified: Secondary | ICD-10-CM

## 2016-07-17 MED ORDER — PAROXETINE HCL 40 MG PO TABS: 40.0000 mg | ORAL_TABLET | Freq: Every day | ORAL | 2 refills | Status: DC

## 2016-07-17 MED ORDER — OXYCODONE-ACETAMINOPHEN 5-325 MG PO TABS: 1.0000 | ORAL_TABLET | ORAL | 0 refills | Status: DC | PRN

## 2016-07-17 MED ORDER — CLINDAMYCIN HCL 300 MG PO CAPS: 300.0000 mg | ORAL_CAPSULE | Freq: Three times a day (TID) | ORAL | 0 refills | Status: DC

## 2016-07-17 MED ORDER — GABAPENTIN 800 MG PO TABS: 800.0000 mg | ORAL_TABLET | Freq: Two times a day (BID) | ORAL | 2 refills | Status: DC

## 2016-07-17 NOTE — ED Provider Notes (Signed)
[facility] Emergency Department Provider Note  ____________________________________________  Time seen: Approximately 10:19 AM  I have reviewed the triage vital signs and the nursing notes.   HISTORY  Chief Complaint Abscess    HPI Caroline Coleman is a 60 y.o. female resents for early developing abscess to her left axilla. Patient reports past medical history the same.In addition patient states that she needs a refill on her Paxil and gabapentin secondary to changing providers. Her current provider lost his medical license. Patient states that she has an allergy to penicillin and sulfa. Denies any other complaints at this time.   Past Medical History:  Diagnosis Date  . Anxiety   . COPD (chronic obstructive pulmonary disease) (HCC)   . Depression   . Thyroid disease     Patient Active Problem List   Diagnosis Date Noted  . Panic disorder with agoraphobia 12/14/2015  . Insomnia 12/14/2015  . Depression 12/14/2015    Past Surgical History:  Procedure Laterality Date  . EYE SURGERY    . HIP SURGERY      Prior to Admission medications   Medication Sig Start Date End Date Taking? Authorizing Provider  albuterol (PROVENTIL HFA;VENTOLIN HFA) 108 (90 BASE) MCG/ACT inhaler Inhale 2 puffs into the lungs every 6 (six) hours as needed for wheezing or shortness of breath. 11/01/15   Darien Ramusavid W Kaminski, MD  ALPRAZolam Prudy Feeler(XANAX) 0.25 MG tablet Take 1 tablet by mouth at bedtime. 08/13/15   Historical Provider, MD  clindamycin (CLEOCIN) 300 MG capsule Take 1 capsule (300 mg total) by mouth 3 (three) times daily. 07/17/16   Charmayne Sheerharles M Ura Hausen, PA-C  gabapentin (NEURONTIN) 800 MG tablet Take 1 tablet (800 mg total) by mouth 2 (two) times daily. 07/17/16 07/17/17  Charmayne Sheerharles M Clarita Mcelvain, PA-C  levothyroxine (SYNTHROID) 150 MCG tablet Take 1 tablet (150 mcg total) by mouth daily. 12/06/15 12/05/16  Jennye MoccasinBrian S Quigley, MD  mirtazapine (REMERON) 15 MG tablet Take 1 tablet (15 mg total) by  mouth at bedtime. 12/14/15   Audery AmelJohn T Clapacs, MD  oxyCODONE-acetaminophen (ROXICET) 5-325 MG tablet Take 1-2 tablets by mouth every 4 (four) hours as needed for severe pain. 07/17/16   Charmayne Sheerharles M Tennessee Hanlon, PA-C  pantoprazole (PROTONIX) 40 MG tablet Take 1 tablet (40 mg total) by mouth daily. 04/08/16 05/09/16  Darci Currentandolph N Brown, MD  PARoxetine (PAXIL) 40 MG tablet Take 1 tablet (40 mg total) by mouth daily. 07/17/16 07/17/17  Charmayne Sheerharles M Teven Mittman, PA-C    Allergies Penicillins and Sulfa antibiotics  No family history on file.  Social History Social History  Substance Use Topics  . Smoking status: Current Every Day Smoker    Packs/day: 1.00    Types: Cigarettes  . Smokeless tobacco: Current User  . Alcohol use No    Review of Systems Constitutional: No fever/chills Eyes: No visual changes. ENT: No sore throat. Cardiovascular: Denies chest pain. Respiratory: Denies shortness of breath. Musculoskeletal: Negative for back pain. Skin: As a for 1 cm nummular lesion located in the left axilla Neurological: Negative for headaches, focal weakness or numbness.  10-point ROS otherwise negative.  ____________________________________________   PHYSICAL EXAM:  VITAL SIGNS: ED Triage Vitals [07/17/16 1009]  Enc Vitals Group     BP 118/67     Pulse Rate 81     Resp 18     Temp 98.1 F (36.7 C)     Temp Source Oral     SpO2 99 %     Weight 155 lb (70.3  kg)     Height 5\' 4"  (1.626 m)     Head Circumference      Peak Flow      Pain Score 7     Pain Loc      Pain Edu?      Excl. in GC?     Constitutional: Alert and oriented. Well appearing and in no acute distress. Cardiovascular: Normal rate, regular rhythm. Grossly normal heart sounds.  Good peripheral circulation. Respiratory: Normal respiratory effort.  No retractions. Lungs CTAB. Musculoskeletal: No lower extremity tenderness nor edema.  No joint effusions. Neurologic:  Normal speech and language. No gross focal neurologic deficits are  appreciated. No gait instability. Skin:  Skin is warm, dry and intact. No rash noted.+1 cm tender in erythematous lesion located within the left axilla. Tender to palpation. Minimal fluctuance. Psychiatric: Mood and affect are normal. Speech and behavior are normal.  ____________________________________________   LABS (all labs ordered are listed, but only abnormal results are displayed)  Labs Reviewed - No data to display ____________________________________________  EKG   ____________________________________________  RADIOLOGY   ____________________________________________   PROCEDURES  Procedure(s) performed: None  Critical Care performed: No  ____________________________________________   INITIAL IMPRESSION / ASSESSMENT AND PLAN / ED COURSE  Pertinent labs & imaging results that were available during my care of the patient were reviewed by me and considered in my medical decision making (see chart for details). Review of the [place] CSRS was performed in accordance of the [facility] prior to dispensing any controlled drugs.  Axillary abscess. Rx given for clindamycin and Percocet for pain. In addition med refills were given for Paxil and gabapentin. Patient reports that she ate will be establishing her health care provider next month.  Clinical Course    ____________________________________________   FINAL CLINICAL IMPRESSION(S) / ED DIAGNOSES  Final diagnoses:  Abscess     This chart was dictated using voice recognition software/Dragon. Despite best efforts to proofread, errors can occur which can change the meaning. Any change was purely unintentional.    Evangeline Dakinharles M Rheta Hemmelgarn, PA-C 07/17/16 1057    Governor Rooksebecca Lord, MD 07/17/16 [phone]

## 2016-07-17 NOTE — ED Triage Notes (Signed)
Pt c/o swelling/redness of the left axillary for the past 2-3 days.

## 2016-08-03 ENCOUNTER — Emergency Department
Admission: EM | Admit: 2016-08-03 | Discharge: 2016-08-03 | Disposition: A | Discharge status: Home / Self Care | Payer: Medicaid Other | Attending: Emergency Medicine | Admitting: Emergency Medicine

## 2016-08-03 ENCOUNTER — Encounter: Payer: Self-pay | Admitting: Emergency Medicine

## 2016-08-03 DIAGNOSIS — Y939 Activity, unspecified: Secondary | ICD-10-CM | POA: Diagnosis not present

## 2016-08-03 DIAGNOSIS — X789XXA Intentional self-harm by unspecified sharp object, initial encounter: Secondary | ICD-10-CM | POA: Diagnosis not present

## 2016-08-03 DIAGNOSIS — Z79899 Other long term (current) drug therapy: Secondary | ICD-10-CM | POA: Diagnosis not present

## 2016-08-03 DIAGNOSIS — Y929 Unspecified place or not applicable: Secondary | ICD-10-CM | POA: Diagnosis not present

## 2016-08-03 DIAGNOSIS — F10920 Alcohol use, unspecified with intoxication, uncomplicated: Secondary | ICD-10-CM

## 2016-08-03 DIAGNOSIS — J449 Chronic obstructive pulmonary disease, unspecified: Secondary | ICD-10-CM | POA: Insufficient documentation

## 2016-08-03 DIAGNOSIS — F10120 Alcohol abuse with intoxication, uncomplicated: Secondary | ICD-10-CM | POA: Diagnosis not present

## 2016-08-03 DIAGNOSIS — S61511A Laceration without foreign body of right wrist, initial encounter: Secondary | ICD-10-CM | POA: Insufficient documentation

## 2016-08-03 DIAGNOSIS — Z23 Encounter for immunization: Secondary | ICD-10-CM | POA: Diagnosis not present

## 2016-08-03 DIAGNOSIS — F17210 Nicotine dependence, cigarettes, uncomplicated: Secondary | ICD-10-CM | POA: Insufficient documentation

## 2016-08-03 DIAGNOSIS — F1994 Other psychoactive substance use, unspecified with psychoactive substance-induced mood disorder: Secondary | ICD-10-CM

## 2016-08-03 DIAGNOSIS — S61519A Laceration without foreign body of unspecified wrist, initial encounter: Secondary | ICD-10-CM

## 2016-08-03 DIAGNOSIS — IMO0002 Reserved for concepts with insufficient information to code with codable children: Secondary | ICD-10-CM

## 2016-08-03 DIAGNOSIS — Z7289 Other problems related to lifestyle: Secondary | ICD-10-CM

## 2016-08-03 DIAGNOSIS — Y999 Unspecified external cause status: Secondary | ICD-10-CM | POA: Diagnosis not present

## 2016-08-03 DIAGNOSIS — F329 Major depressive disorder, single episode, unspecified: Secondary | ICD-10-CM | POA: Diagnosis not present

## 2016-08-03 DIAGNOSIS — F32A Depression, unspecified: Secondary | ICD-10-CM | POA: Diagnosis present

## 2016-08-03 DIAGNOSIS — F1010 Alcohol abuse, uncomplicated: Secondary | ICD-10-CM

## 2016-08-03 LAB — CBC WITH DIFFERENTIAL/PLATELET
BASOS ABS: 0.3 10*3/uL — AB (ref 0–0.1)
BASOS PCT: 3 %
Eosinophils Absolute: 0.5 10*3/uL (ref 0–0.7)
Eosinophils Relative: 6 %
HEMATOCRIT: 43 % (ref 35.0–47.0)
HEMOGLOBIN: 14.8 g/dL (ref 12.0–16.0)
Lymphocytes Relative: 30 %
Lymphs Abs: 2.7 10*3/uL (ref 1.0–3.6)
MCH: 29.7 pg (ref 26.0–34.0)
MCHC: 34.5 g/dL (ref 32.0–36.0)
MCV: 86.2 fL (ref 80.0–100.0)
MONO ABS: 0.4 10*3/uL (ref 0.2–0.9)
Monocytes Relative: 5 %
NEUTROS ABS: 5 10*3/uL (ref 1.4–6.5)
NEUTROS PCT: 56 %
Platelets: 201 10*3/uL (ref 150–440)
RBC: 4.99 MIL/uL (ref 3.80–5.20)
RDW: 15.1 % — AB (ref 11.5–14.5)
WBC: 8.8 10*3/uL (ref 3.6–11.0)

## 2016-08-03 LAB — COMPREHENSIVE METABOLIC PANEL
ALBUMIN: 4.6 g/dL (ref 3.5–5.0)
ALT: 12 U/L — ABNORMAL LOW (ref 14–54)
AST: 22 U/L (ref 15–41)
Alkaline Phosphatase: 102 U/L (ref 38–126)
Anion gap: 10 (ref 5–15)
BILIRUBIN TOTAL: 0.3 mg/dL (ref 0.3–1.2)
BUN: 5 mg/dL — AB (ref 6–20)
CO2: 24 mmol/L (ref 22–32)
Calcium: 8.9 mg/dL (ref 8.9–10.3)
Chloride: 98 mmol/L — ABNORMAL LOW (ref 101–111)
Creatinine, Ser: 0.7 mg/dL (ref 0.44–1.00)
GFR calc Af Amer: >60 mL/min (ref >60)
GFR calc non Af Amer: >60 mL/min (ref >60)
GLUCOSE: 106 mg/dL — AB (ref 65–99)
POTASSIUM: 3.6 mmol/L (ref 3.5–5.1)
Sodium: 132 mmol/L — ABNORMAL LOW (ref 135–145)
TOTAL PROTEIN: 7.9 g/dL (ref 6.5–8.1)

## 2016-08-03 LAB — SALICYLATE LEVEL: Salicylate Lvl: <4 mg/dL (ref 2.8–30.0)

## 2016-08-03 LAB — URINE DRUG SCREEN, QUALITATIVE (ARMC ONLY)
AMPHETAMINES, UR SCREEN: NOT DETECTED
Barbiturates, Ur Screen: NOT DETECTED
Benzodiazepine, Ur Scrn: POSITIVE — AB
CANNABINOID 50 NG, UR ~~LOC~~: NOT DETECTED
COCAINE METABOLITE, UR ~~LOC~~: NOT DETECTED
MDMA (ECSTASY) UR SCREEN: NOT DETECTED
METHADONE SCREEN, URINE: NOT DETECTED
Opiate, Ur Screen: NOT DETECTED
Phencyclidine (PCP) Ur S: NOT DETECTED
TRICYCLIC, UR SCREEN: NOT DETECTED

## 2016-08-03 LAB — URINALYSIS COMPLETE WITH MICROSCOPIC (ARMC ONLY)
Bilirubin Urine: NEGATIVE
Glucose, UA: NEGATIVE mg/dL
KETONES UR: NEGATIVE mg/dL
NITRITE: NEGATIVE
PROTEIN: NEGATIVE mg/dL
SPECIFIC GRAVITY, URINE: 1.003 — AB (ref 1.005–1.030)
pH: 6 (ref 5.0–8.0)

## 2016-08-03 LAB — ETHANOL: Alcohol, Ethyl (B): 324 mg/dL (ref <5)

## 2016-08-03 LAB — ACETAMINOPHEN LEVEL: Acetaminophen (Tylenol), Serum: <10 ug/mL — ABNORMAL LOW (ref 10–30)

## 2016-08-03 MED ORDER — LORAZEPAM 2 MG/ML IJ SOLN
0.0000 mg | Freq: Four times a day (QID) | INTRAMUSCULAR | Status: DC
  Administered 2016-08-03 (×2): 2 mg via INTRAVENOUS
  Filled 2016-08-03 (×2): qty 1

## 2016-08-03 MED ORDER — TETANUS-DIPHTH-ACELL PERTUSSIS 5-2.5-18.5 LF-MCG/0.5 IM SUSP
0.5000 mL | Freq: Once | INTRAMUSCULAR | Status: AC
  Administered 2016-08-03: 0.5 mL via INTRAMUSCULAR
  Filled 2016-08-03: qty 0.5

## 2016-08-03 MED ORDER — SODIUM CHLORIDE 0.9 % IV BOLUS (SEPSIS)
1000.0000 mL | Freq: Once | INTRAVENOUS | Status: AC
  Administered 2016-08-03: 1000 mL via INTRAVENOUS

## 2016-08-03 MED ORDER — VITAMIN B-1 100 MG PO TABS
100.0000 mg | ORAL_TABLET | Freq: Every day | ORAL | Status: DC
  Administered 2016-08-03: 100 mg via ORAL
  Filled 2016-08-03: qty 1

## 2016-08-03 MED ORDER — THIAMINE HCL 100 MG/ML IJ SOLN
Freq: Once | INTRAVENOUS | Status: AC
  Administered 2016-08-03: 03:00:00 via INTRAVENOUS
  Filled 2016-08-03: qty 1000

## 2016-08-03 NOTE — ED Notes (Signed)
Rescinded IVC by Dr. Clapacs 

## 2016-08-03 NOTE — ED Notes (Signed)
Lunch was given to patient 

## 2016-08-03 NOTE — ED Provider Notes (Signed)
-----------------------------------------   3:50 PM on 08/03/2016 -----------------------------------------  Patient was seen by Dr. Toni Amendlapacs with psychiatry who has rescinded the IVC paperwork. Feels patient is safe for outpatient follow up.   Caroline Coleman Caroline Vanlue, MD 08/03/16 1550

## 2016-08-03 NOTE — ED Notes (Signed)
Upon hourly rounding at the start of the shift patient found lying in urine from head to toe. Soiled blankets removed, bed cleaned, new/clean/dry linens applied to bed. Patient ambulated to restroom. Soiled clothing removed. Clean clothing placed on patient.

## 2016-08-03 NOTE — ED Notes (Signed)
IVC/ Consult pending 

## 2016-08-03 NOTE — ED Provider Notes (Signed)
[facility] Emergency Department Provider Note   ____________________________________________   First MD Initiated Contact with Patient 08/03/16 0141     (approximate)  I have reviewed the triage vital signs and the nursing notes.   HISTORY  Chief Complaint Alcohol Intoxication and Extremity Laceration  Limited by intoxication  HPI Caroline Coleman is a 60 y.o. female who presents to the ED from a friend's house via EMS with a chief complaint of depression and self-inflicted injury. Patient has a history of depression who was drinking with friends tonight when she became angry and cut her wrist as a suicidal gesture. Tetanus is not up-to-date. She arrives intoxicated, belligerent, at times tearful. Voices no HI/AH/VH. Voices no medical complaints. Denies recent fever, chills, chest pain, shortness of breath, abdominal pain, nausea, vomiting, diarrhea. Nothing makes her symptoms better or worse.   Past Medical History:  Diagnosis Date  . Anxiety   . COPD (chronic obstructive pulmonary disease) (HCC)   . Depression   . Thyroid disease     Patient Active Problem List   Diagnosis Date Noted  . Panic disorder with agoraphobia 12/14/2015  . Insomnia 12/14/2015  . Depression 12/14/2015    Past Surgical History:  Procedure Laterality Date  . EYE SURGERY    . HIP SURGERY      Prior to Admission medications   Medication Sig Start Date End Date Taking? Authorizing Provider  ALPRAZolam Prudy Feeler) 0.25 MG tablet Take 1 tablet by mouth at bedtime. 08/13/15  Yes Historical Provider, MD  clindamycin (CLEOCIN) 300 MG capsule Take 1 capsule (300 mg total) by mouth 3 (three) times daily. 07/17/16  Yes Charmayne Sheer Beers, PA-C  gabapentin (NEURONTIN) 800 MG tablet Take 1 tablet (800 mg total) by mouth 2 (two) times daily. 07/17/16 07/17/17 Yes Charles M Beers, PA-C  levothyroxine (SYNTHROID) 150 MCG tablet Take 1 tablet (150 mcg total) by mouth daily. 12/06/15 12/05/16 Yes  Jennye Moccasin, MD  oxyCODONE-acetaminophen (ROXICET) 5-325 MG tablet Take 1-2 tablets by mouth every 4 (four) hours as needed for severe pain. 07/17/16  Yes Charmayne Sheer Beers, PA-C  PARoxetine (PAXIL) 40 MG tablet Take 1 tablet (40 mg total) by mouth daily. 07/17/16 07/17/17 Yes Charmayne Sheer Beers, PA-C    Allergies Penicillins and Sulfa antibiotics  History reviewed. No pertinent family history.  Social History Social History  Substance Use Topics  . Smoking status: Current Every Day Smoker    Packs/day: 1.00    Types: Cigarettes  . Smokeless tobacco: Current User  . Alcohol use Yes    Review of Systems  Constitutional: No fever/chills. Eyes: No visual changes. ENT: No sore throat. Cardiovascular: Denies chest pain. Respiratory: Denies shortness of breath. Gastrointestinal: No abdominal pain.  No nausea, no vomiting.  No diarrhea.  No constipation. Genitourinary: Negative for dysuria. Musculoskeletal: Positive for right wrist laceration. Negative for back pain. Skin: Negative for rash. Neurological: Negative for headaches, focal weakness or numbness. Psychiatric:Positive for depression with self injurious behavior.  Limited by intoxication; 10-point ROS otherwise negative.  ____________________________________________   PHYSICAL EXAM:  VITAL SIGNS: ED Triage Vitals  Enc Vitals Group     BP      Pulse      Resp      Temp      Temp src      SpO2      Weight      Height      Head Circumference      Peak Flow  Pain Score      Pain Loc      Pain Edu?      Excl. in GC?     Constitutional: Alert and oriented. Disheveled appearing and in no acute distress. Intoxicated. Eyes: Conjunctivae are normal. PERRL. EOMI. Head: Atraumatic. Nose: No congestion/rhinnorhea. Mouth/Throat: Mucous membranes are moist.  Oropharynx non-erythematous. Neck: No stridor.   Cardiovascular: Normal rate, regular rhythm. Grossly normal heart sounds.  Good peripheral  circulation. Respiratory: Normal respiratory effort.  No retractions. Lungs CTAB. Gastrointestinal: Soft and nontender. No distention. No abdominal bruits. No CVA tenderness. Musculoskeletal: 2 superficial, horizontal and and linear lacerations across right inner wrist. Both are approximately 2 cm, not bleeding and well approximated; not requiring sutures. No lower extremity tenderness nor edema.  No joint effusions. Neurologic:  Normal speech and language. No gross focal neurologic deficits are appreciated. No gait instability. Skin:  Skin is warm, dry and intact. No rash noted. Psychiatric: Mood and affect are belligerent. Speech and behavior are normal.  ____________________________________________   LABS (all labs ordered are listed, but only abnormal results are displayed)  Labs Reviewed  CBC WITH DIFFERENTIAL/PLATELET - Abnormal; Notable for the following:       Result Value   RDW 15.1 (*)    Basophils Absolute 0.3 (*)    All other components within normal limits  COMPREHENSIVE METABOLIC PANEL - Abnormal; Notable for the following:    Sodium 132 (*)    Chloride 98 (*)    Glucose, Bld 106 (*)    BUN 5 (*)    ALT 12 (*)    All other components within normal limits  ETHANOL - Abnormal; Notable for the following:    Alcohol, Ethyl (B) 324 (*)    All other components within normal limits  ACETAMINOPHEN LEVEL - Abnormal; Notable for the following:    Acetaminophen (Tylenol), Serum <10 (*)    All other components within normal limits  SALICYLATE LEVEL  URINE DRUG SCREEN, QUALITATIVE ([facility] ONLY)  URINALYSIS COMPLETEWITH MICROSCOPIC ([facility] ONLY)   ____________________________________________  EKG  None ____________________________________________  RADIOLOGY  None ____________________________________________   PROCEDURES  Procedure(s) performed: None  Procedures  Critical Care performed: No  ____________________________________________   INITIAL IMPRESSION /  ASSESSMENT AND PLAN / ED COURSE  Pertinent labs & imaging results that were available during my care of the patient were reviewed by me and considered in my medical decision making (see chart for details).  60 year old female with a history of depression presents intoxicated with self inflicted superficial injuries to right inner wrist. Will update tetanus, placed on CIWA protocol, and place patient under involuntary commitment for her safety. She will remain in the emergency department pending TTS and psychiatry consults.  Clinical Course  Comment By Time  No further events. IV fluids infusing. Patient will remain in the ED under IVC pending psychiatry disposition. Irean HongJade J Sung, MD 09/06 [phone]     ____________________________________________   FINAL CLINICAL IMPRESSION(S) / ED DIAGNOSES  Final diagnoses:  Alcohol intoxication, uncomplicated (HCC)  Depression  Self-inflicted injury      NEW MEDICATIONS STARTED DURING THIS VISIT:  New Prescriptions   No medications on file     Note:  This document was prepared using Dragon voice recognition software and may include unintentional dictation errors.    Irean HongJade J Sung, MD 08/03/16 [phone]

## 2016-08-03 NOTE — ED Triage Notes (Signed)
Pt arrived from home by EMS after cutting her left wrist x2. EMS reports pt stated she had 2 beers and cut her wrist to harm herself. Upon arrival to ED pt has slurred speech, unable to complete sentences and has 2 1 inch lacerations to the left wrist.

## 2016-08-03 NOTE — BH Assessment (Signed)
Assessment Note  Caroline Coleman is an 60 y.o. female who presents to the ER due to cutting her wrist. She states, she was drinking and started feeling overwhelmed. She states she had the thought of cutting herself. "I had this knife my friend bought me for my birthday. Next thing I know I start cutting myself. I've never done this before. I just can't take it no more. I don't want to die. I can't take care of everybody."  Patient reports she's started drinking heavily approximately two weeks ago. She's stressed out over the fact her son is in prison and she's having minor conflicts with her daughter. The daughter lives in [place].   She receives outpatient treatment with [facility]. She under the psychiatric care of Dr. Janeece Riggers. She reports she receives medication management. "I get my antidepressants from there. I was with Dr. Lacie Scotts..."  During the interview she was intoxicated and liable. With this Clinical research associate, she denies SI/HI and AV/H. Upon arrival to the ER her BAC was 324. At the time of his interview, her UDS hasn't resulted.  Diagnosis: Alcohol Use Disorder                    Depression  Past Medical History:  Past Medical History:  Diagnosis Date  . Anxiety   . COPD (chronic obstructive pulmonary disease) (HCC)   . Depression   . Thyroid disease     Past Surgical History:  Procedure Laterality Date  . EYE SURGERY    . HIP SURGERY      Family History: History reviewed. No pertinent family history.  Social History:  reports that she has been smoking Cigarettes.  She has been smoking about 1.00 pack per day. She uses smokeless tobacco. She reports that she drinks alcohol. She reports that she does not use drugs.  Additional Social History:  Alcohol / Drug Use Pain Medications: See PTA Prescriptions: See PTA Over the Counter: See PTA History of alcohol / drug use?: Yes Longest period of sobriety (when/how long): Unknown, patient didn't say Negative  Consequences of Use: Personal relationships, Work / School Withdrawal Symptoms:  (Reports of none) Substance #1 Name of Substance 1: Alcohol 1 - Age of First Use: Teenager 1 - Amount (size/oz): "About 2, 40oz beers" 1 - Frequency: Daily 1 - Duration: "I just started drinking. About two weeks" 1 - Last Use / Amount: 08/02/2016  CIWA: CIWA-Ar BP: 96/83 Pulse Rate: 85 Nausea and Vomiting: no nausea and no vomiting Tactile Disturbances: none Tremor: no tremor Auditory Disturbances: not present Paroxysmal Sweats: no sweat visible Visual Disturbances: not present Anxiety: no anxiety, at ease Headache, Fullness in Head: none present Agitation: moderately fidgety and restless Orientation and Clouding of Sensorium: disoriented for date by more than 2 calendar days CIWA-Ar Total: 7 COWS:    Allergies:  Allergies  Allergen Reactions  . Penicillins Other (See Comments)    Has patient had a PCN reaction causing immediate rash, facial/tongue/throat swelling, SOB or lightheadedness with hypotension: no Has patient had a PCN reaction causing severe rash involving mucus membranes or skin necrosis: no Has patient had a PCN reaction that required hospitalization no  REACTION UNKNOWN Has patient had a PCN reaction occurring within the last 10 years: no If all of the above answers are "NO", then may proceed with Cephalosporin use.    . Sulfa Antibiotics Other (See Comments)    REACTION UNKNOWN     Home Medications:  (Not in a hospital  admission)  OB/GYN Status:  No LMP recorded. Patient is postmenopausal.  General Assessment Data Location of Assessment: [facility] ED TTS Assessment: In system Is this a Tele or Face-to-Face Assessment?: Face-to-Face Is this an Initial Assessment or a Re-assessment for this encounter?: Initial Assessment Marital status: Single Maiden name: n/a Is patient pregnant?: No Pregnancy Status: No Living Arrangements: Alone Can pt return to current living  arrangement?: Yes Admission Status: Involuntary Is patient capable of signing voluntary admission?: No Referral Source: Self/Family/Friend Insurance type: Medicaid  Medical Screening Exam [facility] Walk-in ONLY) Medical Exam completed: Yes  Crisis Care Plan Living Arrangements: Alone Legal Guardian: Other: Name of Psychiatrist: Dr. Janeece Riggers [facility]) Name of Therapist: Reports of none  Education Status Is patient currently in school?: No Current Grade: n/a Highest grade of school patient has completed: 10th Grade Name of school: n/a Contact person: n/a  Risk to self with the past 6 months Suicidal Ideation: No-Not Currently/Within Last 6 Months Has patient been a risk to self within the past 6 months prior to admission? : Yes Suicidal Intent: No-Not Currently/Within Last 6 Months Has patient had any suicidal intent within the past 6 months prior to admission? : Yes Is patient at risk for suicide?: Yes Suicidal Plan?: No-Not Currently/Within Last 6 Months Has patient had any suicidal plan within the past 6 months prior to admission? : Yes Access to Means: Yes Specify Access to Suicidal Means: Knife What has been your use of drugs/alcohol within the last 12 months?: Alcohol Previous Attempts/Gestures: No How many times?: 0 Other Self Harm Risks: Reports of none Triggers for Past Attempts: None known Intentional Self Injurious Behavior: None Family Suicide History: No Recent stressful life event(s): Other (Comment), Loss (Comment), Conflict (Comment), Turmoil (Comment) Persecutory voices/beliefs?: No Depression: Yes Depression Symptoms: Fatigue, Guilt, Loss of interest in usual pleasures, Feeling worthless/self pity, Isolating, Tearfulness Substance abuse history and/or treatment for substance abuse?: No Suicide prevention information given to non-admitted patients: Not applicable  Risk to Others within the past 6 months Homicidal Ideation: No Does patient have any  lifetime risk of violence toward others beyond the six months prior to admission? : No Thoughts of Harm to Others: No Current Homicidal Intent: No Current Homicidal Plan: No Access to Homicidal Means: No Identified Victim: Reports of none History of harm to others?: No Assessment of Violence: None Noted Violent Behavior Description: Reports of none Does patient have access to weapons?: No Criminal Charges Pending?: No Does patient have a court date: No Is patient on probation?: No  Psychosis Hallucinations: None noted Delusions: None noted  Mental Status Report Appearance/Hygiene: In hospital gown, In scrubs, Unremarkable Eye Contact: Poor Motor Activity: Unremarkable, Unsteady, Freedom of movement (Patient intoxicated) Speech: Slow, Soft, Slurred Level of Consciousness: Drowsy (Patient intoxicated) Mood: Anxious, Sad, Labile (Patient intoxicated) Affect: Appropriate to circumstance, Labile Anxiety Level: Minimal Thought Processes: Relevant (Patient intoxicated) Judgement: Impaired Orientation: Person, Place, Time, Situation, Appropriate for developmental age Obsessive Compulsive Thoughts/Behaviors: None  Cognitive Functioning Concentration: Decreased Memory: Recent Intact, Remote Intact IQ: Average Insight: Fair Impulse Control: Poor Appetite: Fair Weight Loss: 0 Weight Gain: 0 Sleep: No Change Total Hours of Sleep: 8 Vegetative Symptoms: None  ADLScreening [facility] Assessment Services) Patient's cognitive ability adequate to safely complete daily activities?: Yes Patient able to express need for assistance with ADLs?: Yes Independently performs ADLs?: Yes (appropriate for developmental age)  Prior Inpatient Therapy Prior Inpatient Therapy: No Prior Therapy Dates: Reports of none Prior Therapy Facilty/Provider(s): Reports of none Reason for  Treatment: Reports of none  Prior Outpatient Therapy Prior Outpatient Therapy: Yes Prior Therapy Dates: Current Prior  Therapy Facilty/Provider(s): [facility] Reason for Treatment: Depression (Medication Management) Does patient have an ACCT team?: No Does patient have Intensive In-House Services?  : No Does patient have [facility] services? : No Does patient have P4CC services?: No  ADL Screening (condition at time of admission) Patient's cognitive ability adequate to safely complete daily activities?: Yes Is the patient deaf or have difficulty hearing?: No Does the patient have difficulty seeing, even when wearing glasses/contacts?: No Does the patient have difficulty concentrating, remembering, or making decisions?: No Patient able to express need for assistance with ADLs?: Yes Does the patient have difficulty dressing or bathing?: No Independently performs ADLs?: Yes (appropriate for developmental age) Does the patient have difficulty walking or climbing stairs?: No Weakness of Legs: None Weakness of Arms/Hands: None  Home Assistive Devices/Equipment Home Assistive Devices/Equipment: None  Therapy Consults (therapy consults require a physician order) PT Evaluation Needed: No OT Evalulation Needed: No SLP Evaluation Needed: No Abuse/Neglect Assessment (Assessment to be complete while patient is alone) Physical Abuse: Denies Verbal Abuse: Denies Sexual Abuse: Denies Exploitation of patient/patient's resources: Denies Self-Neglect: Denies Values / Beliefs Cultural Requests During Hospitalization: None Spiritual Requests During Hospitalization: None Consults Spiritual Care Consult Needed: No Social Work Consult Needed: No Merchant navy officerAdvance Directives (For Healthcare) Would patient like information on creating an advanced directive?: No - patient declined information    Additional Information 1:1 In Past 12 Months?: No CIRT Risk: No Elopement Risk: No Does patient have medical clearance?: Yes  Child/Adolescent Assessment Running Away Risk: Denies (Patient is an adult)  Disposition:   Disposition Initial Assessment Completed for this Encounter: Yes Disposition of Patient: Other dispositions (ER MD Ordered Psych Consult)  On Site Evaluation by:   Reviewed with Physician:    Lilyan Gilfordalvin J. Srinika Delone MS, LCAS, LPC, NCC, CCSI Therapeutic Triage Specialist 08/03/2016 7:21 AM

## 2016-08-03 NOTE — Consult Note (Signed)
[place] [facility] Consult   Reason for Consult:  Consult for 60 year old woman came into the hospital after cutting her wrist Referring Physician:  Cinda Quest Patient Identification: Caroline Coleman MRN:  [id number] Principal Diagnosis: Substance induced mood disorder [facility]) Diagnosis:   Patient Active Problem List   Diagnosis Date Noted  . Self-inflicted laceration of wrist [S61.519A] 08/03/2016  . Substance induced mood disorder ([place]) [F19.94] 08/03/2016  . Alcohol abuse [F10.10] 08/03/2016  . 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 Insomnia [G47.00] 12/14/2015  . Depression [F32.9] 12/14/2015    Total Time spent with patient: 1 hour  Subjective:   NIKYAH Coleman is a 60 y.o. female patient admitted with "I've been real depressed".  HPI:  Patient interviewed. Chart reviewed. Patient reports her mood is been bad. Nerves of been bad. Lots of stress related to her family. Lots of stress related to not having a very stable place to live. Last night she was intoxicated and cut her left wrist superficially. Denies that she is was actually wanting to die. She's been having crying spells. She is only intermittently compliant with prescribed outpatient psychiatric medicine. She claims that she doesn't drink every night and that it was rare that she was drinking as much. She did yesterday. Denies that she's using any other drugs. No hallucinations or psychotic symptoms. Doesn't sleep well. Appetite varies a lot. No homicidal thoughts.  Social history: Just moved in with her grandson. Hasn't really had a stable place to stay recently. Not working. Lots of conflict with her family.  Medical history: Chronic stomach problems. Chronic pain. No other medical problems reported.  Substance abuse history: Patient denies that she's ever been involved in any substance abuse treatment. Says that her drinking is only intermittent. Denies use of any other drugs.  Past Psychiatric  History: Patient has been treated with Paxil and Remeron and a "nerve pill recently. Used to see Dr. Linton Ham. More recently might be saying Dr. Kasandra Knudsen. She has no history of prior inpatient treatment or suicide attempts in the past or violence to others.  Risk to Self: Suicidal Ideation: No-Not Currently/Within Last 6 Months Suicidal Intent: No-Not Currently/Within Last 6 Months Is patient at risk for suicide?: Yes Suicidal Plan?: No-Not Currently/Within Last 6 Months Access to Means: Yes Specify Access to Suicidal Means: Knife What has been your use of drugs/alcohol within the last 12 months?: Alcohol How many times?: 0 Other Self Harm Risks: Reports of none Triggers for Past Attempts: None known Intentional Self Injurious Behavior: None Risk to Others: Homicidal Ideation: No Thoughts of Harm to Others: No Current Homicidal Intent: No Current Homicidal Plan: No Access to Homicidal Means: No Identified Victim: Reports of none History of harm to others?: No Assessment of Violence: None Noted Violent Behavior Description: Reports of none Does patient have access to weapons?: No Criminal Charges Pending?: No Does patient have a court date: No Prior Inpatient Therapy: Prior Inpatient Therapy: No Prior Therapy Dates: Reports of none Prior Therapy Facilty/Provider(s): Reports of none Reason for Treatment: Reports of none Prior Outpatient Therapy: Prior Outpatient Therapy: Yes Prior Therapy Dates: Current Prior Therapy Facilty/Provider(s): [facility] Reason for Treatment: Depression (Medication Management) Does patient have an ACCT team?: No Does patient have Intensive In-House Services?  : No Does patient have [facility] services? : No Does patient have P4CC services?: No  Past Medical History:  Past Medical History:  Diagnosis Date  . Anxiety   . COPD (chronic obstructive pulmonary disease) ([place])   .  Depression   . Thyroid disease     Past Surgical History:  Procedure  Laterality Date  . EYE SURGERY    . HIP SURGERY     Family History: History reviewed. No pertinent family history. Family Psychiatric  History: She says there is a positive family history of anxiety and depression Social History:  History  Alcohol Use  . Yes     History  Drug Use No    Social History   Social History  . Marital status: Legally Separated    Spouse name: N/A  . Number of children: N/A  . Years of education: N/A   Social History Main Topics  . Smoking status: Current Every Day Smoker    Packs/day: 1.00    Types: Cigarettes  . Smokeless tobacco: Current User  . Alcohol use Yes  . Drug use: No  . Sexual activity: Not Asked   Other Topics Concern  . None   Social History Narrative  . None   Additional Social History:    Allergies:    Labs:  Results for orders placed or performed during the hospital encounter of 08/03/16 (from the past 48 hour(s))  CBC with Differential     Status: Abnormal   Collection Time: 08/03/16  2:04 AM  Result Value Ref Range   WBC 8.8 3.6 - 11.0 K/uL   RBC 4.99 3.80 - 5.20 MIL/uL   Hemoglobin 14.8 12.0 - 16.0 g/dL   HCT 43.0 35.0 - 47.0 %   MCV 86.2 80.0 - 100.0 fL   MCH 29.7 26.0 - 34.0 pg   MCHC 34.5 32.0 - 36.0 g/dL   RDW 15.1 (H) 11.5 - 14.5 %   Platelets 201 150 - 440 K/uL   Neutrophils Relative % 56 %   Neutro Abs 5.0 1.4 - 6.5 K/uL   Lymphocytes Relative 30 %   Lymphs Abs 2.7 1.0 - 3.6 K/uL   Monocytes Relative 5 %   Monocytes Absolute 0.4 0.2 - 0.9 K/uL   Eosinophils Relative 6 %   Eosinophils Absolute 0.5 0 - 0.7 K/uL   Basophils Relative 3 %   Basophils Absolute 0.3 (H) 0 - 0.1 K/uL  Comprehensive metabolic panel     Status: Abnormal   Collection Time: 08/03/16  2:04 AM  Result Value Ref Range   Sodium 132 (L) 135 - 145 mmol/L   Potassium 3.6 3.5 - 5.1 mmol/L   Chloride 98 (L) 101 - 111 mmol/L   CO2 24 22 - 32 mmol/L   Glucose, Bld 106 (H) 65 - 99 mg/dL   BUN 5 (L) 6 - 20 mg/dL   Creatinine, Ser  0.70 0.44 - 1.00 mg/dL   Calcium 8.9 8.9 - 10.3 mg/dL   Total Protein 7.9 6.5 - 8.1 g/dL   Albumin 4.6 3.5 - 5.0 g/dL   AST 22 15 - 41 U/L   ALT 12 (L) 14 - 54 U/L   Alkaline Phosphatase 102 38 - 126 U/L   Total Bilirubin 0.3 0.3 - 1.2 mg/dL   GFR calc non Af Amer >60 >60 mL/min   GFR calc Af Amer >60 >60 mL/min    Comment: (NOTE) The eGFR has been calculated using the CKD EPI equation. This calculation has not been validated in all clinical situations. eGFR's persistently <60 mL/min signify possible Chronic Kidney Disease.    Anion gap 10 5 - 15  Ethanol     Status: Abnormal   Collection Time: 08/03/16  2:04 AM  Result  Value Ref Range   Alcohol, Ethyl (B) 324 (HH) <5 mg/dL    Comment: CRITICAL RESULT CALLED TO, READ BACK BY AND VERIFIED WITH RAQUEL DAVID ON 08/03/16 AT 0319 BY TLB        LOWEST DETECTABLE LIMIT FOR SERUM ALCOHOL IS 5 mg/dL FOR MEDICAL PURPOSES ONLY   Acetaminophen level     Status: Abnormal   Collection Time: 08/03/16  2:04 AM  Result Value Ref Range   Acetaminophen (Tylenol), Serum <10 (L) 10 - 30 ug/mL    Comment:        THERAPEUTIC CONCENTRATIONS VARY SIGNIFICANTLY. A RANGE OF 10-30 ug/mL MAY BE AN EFFECTIVE CONCENTRATION FOR MANY PATIENTS. HOWEVER, SOME ARE BEST TREATED AT CONCENTRATIONS OUTSIDE THIS RANGE. ACETAMINOPHEN CONCENTRATIONS >150 ug/mL AT 4 HOURS AFTER INGESTION AND >50 ug/mL AT 12 HOURS AFTER INGESTION ARE OFTEN ASSOCIATED WITH TOXIC REACTIONS.   Salicylate level     Status: None   Collection Time: 08/03/16  2:04 AM  Result Value Ref Range   Salicylate Lvl <5.6 2.8 - 30.0 mg/dL  Urine Drug Screen, Qualitative ([facility] only)     Status: Abnormal   Collection Time: 08/03/16 11:39 AM  Result Value Ref Range   Tricyclic, Ur Screen NONE DETECTED NONE DETECTED   Amphetamines, Ur Screen NONE DETECTED NONE DETECTED   MDMA (Ecstasy)Ur Screen NONE DETECTED NONE DETECTED   Cocaine Metabolite,Ur [place] NONE DETECTED NONE DETECTED   Opiate, Ur  Screen NONE DETECTED NONE DETECTED   Phencyclidine (PCP) Ur S NONE DETECTED NONE DETECTED   Cannabinoid 50 Ng, Ur [place] NONE DETECTED NONE DETECTED   Barbiturates, Ur Screen NONE DETECTED NONE DETECTED   Benzodiazepine, Ur Scrn POSITIVE (A) NONE DETECTED   Methadone Scn, Ur NONE DETECTED NONE DETECTED    Comment: (NOTE) 314  Tricyclics, urine               Cutoff 1000 ng/mL 200  Amphetamines, urine             Cutoff 1000 ng/mL 300  MDMA (Ecstasy), urine           Cutoff 500 ng/mL 400  Cocaine Metabolite, urine       Cutoff 300 ng/mL 500  Opiate, urine                   Cutoff 300 ng/mL 600  Phencyclidine (PCP), urine      Cutoff 25 ng/mL 700  Cannabinoid, urine              Cutoff 50 ng/mL 800  Barbiturates, urine             Cutoff 200 ng/mL 900  Benzodiazepine, urine           Cutoff 200 ng/mL 1000 Methadone, urine                Cutoff 300 ng/mL 1100 1200 The urine drug screen provides only a preliminary, unconfirmed 1300 analytical test result and should not be used for non-medical 1400 purposes. Clinical consideration and professional judgment should 1500 be applied to any positive drug screen result due to possible 1600 interfering substances. A more specific alternate chemical method 1700 must be used in order to obtain a confirmed analytical result.  1800 Gas chromato graphy / mass spectrometry (GC/MS) is the preferred 1900 confirmatory method.   Urinalysis complete, with microscopic [facility] only)     Status: Abnormal   Collection Time: 08/03/16 11:39 AM  Result Value Ref Range   Color, Urine  YELLOW (A) YELLOW   APPearance CLOUDY (A) CLEAR   Glucose, UA NEGATIVE NEGATIVE mg/dL   Bilirubin Urine NEGATIVE NEGATIVE   Ketones, ur NEGATIVE NEGATIVE mg/dL   Specific Gravity, Urine 1.003 (L) 1.005 - 1.030   Hgb urine dipstick 2+ (A) NEGATIVE   pH 6.0 5.0 - 8.0   Protein, ur NEGATIVE NEGATIVE mg/dL   Nitrite NEGATIVE NEGATIVE   Leukocytes, UA 3+ (A) NEGATIVE   RBC / HPF 6-30 0 -  5 RBC/hpf   WBC, UA TOO NUMEROUS TO COUNT 0 - 5 WBC/hpf   Bacteria, UA RARE (A) NONE SEEN   Squamous Epithelial / LPF 0-5 (A) NONE SEEN   WBC Clumps PRESENT     Current Facility-Administered Medications  Medication Dose Route Frequency Provider Last Rate Last Dose  . LORazepam (ATIVAN) injection 0-4 mg  0-4 mg Intravenous Q6H Paulette Blanch, MD   Stopped at 08/03/16 1149  . thiamine (VITAMIN B-1) tablet 100 mg  100 mg Oral Daily Paulette Blanch, MD   100 mg at 08/03/16 1129   Current Outpatient Prescriptions  Medication Sig Dispense Refill  . ALPRAZolam (XANAX) 0.25 MG tablet Take 1 tablet by mouth at bedtime.  5  . clindamycin (CLEOCIN) 300 MG capsule Take 1 capsule (300 mg total) by mouth 3 (three) times daily. 30 capsule 0  . gabapentin (NEURONTIN) 800 MG tablet Take 1 tablet (800 mg total) by mouth 2 (two) times daily. 60 tablet 2  . levothyroxine (SYNTHROID) 150 MCG tablet Take 1 tablet (150 mcg total) by mouth daily. 30 tablet 11  . oxyCODONE-acetaminophen (ROXICET) 5-325 MG tablet Take 1-2 tablets by mouth every 4 (four) hours as needed for severe pain. 15 tablet 0  . PARoxetine (PAXIL) 40 MG tablet Take 1 tablet (40 mg total) by mouth daily. 30 tablet 2    Musculoskeletal: Strength & Muscle Tone: within normal limits Gait & Station: normal Patient leans: N/A  Psychiatric Specialty Exam: Physical Exam  Nursing note and vitals reviewed. Constitutional: She appears well-developed and well-nourished.  HENT:  Head: Normocephalic and atraumatic.  Eyes: Conjunctivae are normal. Pupils are equal, round, and reactive to light.  Neck: Normal range of motion.  Cardiovascular: Regular rhythm and normal heart sounds.   Respiratory: Effort normal. No respiratory distress.  GI: Soft.  Musculoskeletal: Normal range of motion.  Neurological: She is alert.  Skin: Skin is warm and dry.  Psychiatric: Her behavior is normal. Judgment normal. Her speech is slurred. She exhibits a depressed mood.  She expresses no suicidal ideation. She exhibits abnormal recent memory.    Review of Systems  Constitutional: Negative.   HENT: Negative.   Eyes: Negative.   Respiratory: Negative.   Cardiovascular: Negative.   Gastrointestinal: Negative.   Musculoskeletal: Negative.   Skin: Negative.   Neurological: Negative.   Psychiatric/Behavioral: Positive for depression and substance abuse. Negative for hallucinations, memory loss and suicidal ideas. The patient is nervous/anxious. The patient does not have insomnia.     Blood pressure (!) 109/55, pulse (!) 55, temperature 98.4 F (36.9 C), temperature source Oral, resp. rate 18, height '5\' 4"'  (1.626 m), weight 69.9 kg (154 lb), SpO2 94 %.Body mass index is 26.43 kg/m.  General Appearance: Disheveled  Eye Contact:  Minimal  Speech:  Slow  Volume:  Normal  Mood:  Depressed  Affect:  Constricted  Thought Process:  Goal Directed  Orientation:  Full (Time, Place, and Person)  Thought Content:  Logical  Suicidal Thoughts:  No  Homicidal  Thoughts:  No  Memory:  Immediate;   Good Recent;   Fair Remote;   Fair  Judgement:  Good  Insight:  Good  Psychomotor Activity:  Decreased  Concentration:  Concentration: Fair  Recall:  [employer] of Knowledge:  Fair  Language:  Fair  Akathisia:  No  Handed:  Right  AIMS (if indicated):     Assets:  Communication Skills Desire for Improvement Resilience  ADL's:  Intact  Cognition:  WNL  Sleep:        Treatment Plan Summary: Plan Patient presented with superficial cuts to her arm. Denies any suicidal thoughts. Denies any wish to die. No evidence of psychosis. Patient ordered he has in place outpatient treatment. She can continue on her psychiatric medicines as prescribed. Supportive counseling completed. Discontinue IVC. She can be released from the emergency room.  Disposition: Patient does not meet criteria for psychiatric inpatient admission.  Alethia Berthold, MD 08/03/2016 5:32 PM

## 2016-08-03 NOTE — ED Notes (Signed)
Patient can ambulate by self with steady gait. However, patient is incontinent.

## 2016-08-03 NOTE — Discharge Instructions (Signed)
Please seek medical attention for any high fevers, chest pain, shortness of breath, change in behavior, persistent vomiting, bloody stool or any other new or concerning symptoms.  

## 2016-09-21 ENCOUNTER — Emergency Department: Payer: Medicaid Other

## 2016-09-21 ENCOUNTER — Emergency Department
Admission: EM | Admit: 2016-09-21 | Discharge: 2016-09-21 | Disposition: A | Discharge status: Home / Self Care | Payer: Medicaid Other | Attending: Emergency Medicine | Admitting: Emergency Medicine

## 2016-09-21 ENCOUNTER — Encounter: Payer: Self-pay | Admitting: *Deleted

## 2016-09-21 DIAGNOSIS — F17290 Nicotine dependence, other tobacco product, uncomplicated: Secondary | ICD-10-CM | POA: Insufficient documentation

## 2016-09-21 DIAGNOSIS — Z79899 Other long term (current) drug therapy: Secondary | ICD-10-CM | POA: Insufficient documentation

## 2016-09-21 DIAGNOSIS — H9201 Otalgia, right ear: Secondary | ICD-10-CM | POA: Insufficient documentation

## 2016-09-21 DIAGNOSIS — R05 Cough: Secondary | ICD-10-CM | POA: Diagnosis present

## 2016-09-21 DIAGNOSIS — J449 Chronic obstructive pulmonary disease, unspecified: Secondary | ICD-10-CM | POA: Diagnosis not present

## 2016-09-21 DIAGNOSIS — F17210 Nicotine dependence, cigarettes, uncomplicated: Secondary | ICD-10-CM | POA: Diagnosis not present

## 2016-09-21 DIAGNOSIS — R059 Cough, unspecified: Secondary | ICD-10-CM

## 2016-09-21 MED ORDER — PAROXETINE HCL 40 MG PO TABS: 40.0000 mg | ORAL_TABLET | Freq: Every day | ORAL | 0 refills | Status: DC

## 2016-09-21 MED ORDER — LEVOTHYROXINE SODIUM 150 MCG PO TABS: 150.0000 ug | ORAL_TABLET | Freq: Every day | ORAL | 1 refills | Status: DC

## 2016-09-21 MED ORDER — BENZONATATE 100 MG PO CAPS: 100.0000 mg | ORAL_CAPSULE | Freq: Three times a day (TID) | ORAL | 0 refills | Status: DC | PRN

## 2016-09-21 MED ORDER — DEXAMETHASONE SODIUM PHOSPHATE 10 MG/ML IJ SOLN
10.0000 mg | Freq: Once | INTRAMUSCULAR | Status: AC
  Administered 2016-09-21: 10 mg via INTRAMUSCULAR
  Filled 2016-09-21: qty 1

## 2016-09-21 MED ORDER — FLUTICASONE PROPIONATE 50 MCG/ACT NA SUSP: 2.0000 | Freq: Every day | NASAL | 0 refills | Status: DC

## 2016-09-21 MED ORDER — ALBUTEROL SULFATE HFA 108 (90 BASE) MCG/ACT IN AERS: 2.0000 | INHALATION_SPRAY | Freq: Four times a day (QID) | RESPIRATORY_TRACT | 0 refills | Status: DC | PRN

## 2016-09-21 MED ORDER — GABAPENTIN 800 MG PO TABS: 800.0000 mg | ORAL_TABLET | Freq: Two times a day (BID) | ORAL | 1 refills | Status: DC

## 2016-09-21 NOTE — Discharge Instructions (Signed)
Your exam and chest x-ray are normal. You have some mild cough and inflammation. Take the prescription meds as directed. Follow-up with Dr. Dossie Arbourrissman for continued symptoms. Return to the ED for worsening symptoms.

## 2016-09-21 NOTE — ED Triage Notes (Signed)
Pt reports a productive cough for 2 days.  cig smoker.  Chills.  Pt states she has a right earache.  Pt taking otc meds for  Cough with no relief.  Pt alert.  Speech clear.

## 2016-09-21 NOTE — ED Provider Notes (Signed)
[facility] Emergency Department Provider Note ____________________________________________  Time seen: 2137  I have reviewed the triage vital signs and the nursing notes.  HISTORY  Chief Complaint  Cough  HPI NITI LEISURE is a 60 y.o. female presents to the ED for evaluation of dry, non-productive cough for the last 2 days. She denies any interim fevers, chills, or sweats. She does admit to not having her home meds for COPD for the last several weeks. She has been dosing OTC meds sporadically for symptom relief.   Past Medical History:  Diagnosis Date  . Anxiety   . COPD (chronic obstructive pulmonary disease) ([facility])   . Depression   . Thyroid disease     Patient Active Problem List   Diagnosis Date Noted  . Self-inflicted laceration of wrist 08/03/2016  . Substance induced mood disorder ([facility]) 08/03/2016  . Alcohol abuse 08/03/2016  . Panic disorder with agoraphobia 12/14/2015  . Insomnia 12/14/2015  . Depression 12/14/2015    Past Surgical History:  Procedure Laterality Date  . EYE SURGERY    . HIP SURGERY      Prior to Admission medications   Medication Sig Start Date End Date Taking? Authorizing Provider  albuterol (PROVENTIL HFA;VENTOLIN HFA) 108 (90 Base) MCG/ACT inhaler Inhale 2 puffs into the lungs every 6 (six) hours as needed for wheezing or shortness of breath. 09/21/16   Zahir Eisenhour V Bacon Tijuan Dantes, PA-C  ALPRAZolam (XANAX) 0.25 MG tablet Take 1 tablet by mouth at bedtime. 08/13/15   Historical Provider, MD  benzonatate (TESSALON PERLES) 100 MG capsule Take 1 capsule (100 mg total) by mouth 3 (three) times daily as needed for cough (Take 1-2 per dose). 09/21/16   Simisola Sandles V Bacon Freddrick Gladson, PA-C  clindamycin (CLEOCIN) 300 MG capsule Take 1 capsule (300 mg total) by mouth 3 (three) times daily. 07/17/16   Charmayne Sheer Beers, PA-C  fluticasone (FLONASE) 50 MCG/ACT nasal spray Place 2 sprays into both nostrils daily. 09/21/16   Vanesa Renier V Bacon  Aaryan Essman, PA-C  gabapentin (NEURONTIN) 800 MG tablet Take 1 tablet (800 mg total) by mouth 2 (two) times daily. 07/17/16 07/17/17  Charmayne Sheer Beers, PA-C  gabapentin (NEURONTIN) 800 MG tablet Take 1 tablet (800 mg total) by mouth 2 (two) times daily. 09/21/16 09/21/17  Navea Woodrow V Bacon Ouita Nish, PA-C  levothyroxine (SYNTHROID) 150 MCG tablet Take 1 tablet (150 mcg total) by mouth daily. 12/06/15 12/05/16  Jennye Moccasin, MD  levothyroxine (SYNTHROID) 150 MCG tablet Take 1 tablet (150 mcg total) by mouth daily. 09/21/16 09/21/17  Lazette Estala V Bacon Pearley Millington, PA-C  oxyCODONE-acetaminophen (ROXICET) 5-325 MG tablet Take 1-2 tablets by mouth every 4 (four) hours as needed for severe pain. 07/17/16   Charmayne Sheer Beers, PA-C  PARoxetine (PAXIL) 40 MG tablet Take 1 tablet (40 mg total) by mouth daily. 07/17/16 07/17/17  Charmayne Sheer Beers, PA-C  PARoxetine (PAXIL) 40 MG tablet Take 1 tablet (40 mg total) by mouth daily. 09/21/16 09/21/17  Rosie Golson V Bacon Wyona Neils, PA-C   Allergies Penicillins and Sulfa antibiotics  No family history on file.  Social History Social History  Substance Use Topics  . Smoking status: Current Every Day Smoker    Packs/day: 1.00    Types: Cigarettes  . Smokeless tobacco: Current User  . Alcohol use No   Review of Systems  Constitutional: Negative for fever. Eyes: Negative for visual changes. ENT: Negative for sore throat. Right earache.  Cardiovascular: Negative for chest pain. Respiratory: Negative for shortness of breath. Reports  cough as above.  Skin: Negative for rash. ____________________________________________  PHYSICAL EXAM:  VITAL SIGNS: ED Triage Vitals  Enc Vitals Group     BP 09/21/16 2116 (!) 130/57     Pulse Rate 09/21/16 2116 74     Resp 09/21/16 2116 20     Temp 09/21/16 2116 98.3 F (36.8 C)     Temp Source 09/21/16 2116 Oral     SpO2 09/21/16 2116 97 %     Weight 09/21/16 2117 155 lb (70.3 kg)     Height 09/21/16 2117 5\' 4"  (1.626 m)     Head Circumference  --      Peak Flow --      Pain Score 09/21/16 2117 8     Pain Loc --      Pain Edu? --      Excl. in GC? --     Constitutional: Alert and oriented. Well appearing and in no distress. Head: Normocephalic and atraumatic. Eyes: Conjunctivae are normal. PERRL. Normal extraocular movements Ears: Canals clear. TMs intact bilaterally. Nose: No congestion/rhinorrhea/epistaxis. Mouth/Throat: Mucous membranes are moist. Neck: Supple. No thyromegaly. Hematological/Lymphatic/Immunological: No cervical lymphadenopathy. Cardiovascular: Normal rate, regular rhythm. Normal distal pulses. Respiratory: Normal respiratory effort. No wheezes/rales/rhonchi. Skin:  Skin is warm, dry and intact. No rash noted. ____________________________________________   RADIOLOGY  CXR  IMPRESSION: No active cardiopulmonary disease. ____________________________________________  PROCEDURES  Decadron 10 mg IM ____________________________________________  INITIAL IMPRESSION / ASSESSMENT AND PLAN / ED COURSE  Patient with a normal exam and negative CXR. Her symptoms are likely due to not having her routine pulmonary inhalers. She is discharged with a prescription for albuterol, Tessalon Perles, fluticasone, and her maintenance meds levothyroxine, gabapentin, and paroxetine. She will follow-up with Dr. Dossie Arbourrissman, as scheduled for continued symptoms.   Clinical Course   ____________________________________________  FINAL CLINICAL IMPRESSION(S) / ED DIAGNOSES  Final diagnoses:  Cough     Lissa HoardJenise V Bacon Makella Buckingham, PA-C 09/21/16 2247    Loleta Roseory Forbach, MD 09/21/16 2257

## 2016-12-18 ENCOUNTER — Emergency Department: Payer: Medicaid Other

## 2016-12-18 ENCOUNTER — Emergency Department
Admission: EM | Admit: 2016-12-18 | Discharge: 2016-12-18 | Disposition: A | Discharge status: Home / Self Care | Payer: Medicaid Other | Attending: Emergency Medicine | Admitting: Emergency Medicine

## 2016-12-18 ENCOUNTER — Encounter: Payer: Self-pay | Admitting: *Deleted

## 2016-12-18 DIAGNOSIS — J441 Chronic obstructive pulmonary disease with (acute) exacerbation: Secondary | ICD-10-CM

## 2016-12-18 DIAGNOSIS — Z79899 Other long term (current) drug therapy: Secondary | ICD-10-CM | POA: Diagnosis not present

## 2016-12-18 DIAGNOSIS — Z760 Encounter for issue of repeat prescription: Secondary | ICD-10-CM | POA: Insufficient documentation

## 2016-12-18 DIAGNOSIS — F17210 Nicotine dependence, cigarettes, uncomplicated: Secondary | ICD-10-CM | POA: Insufficient documentation

## 2016-12-18 DIAGNOSIS — R0602 Shortness of breath: Secondary | ICD-10-CM | POA: Diagnosis present

## 2016-12-18 LAB — CBC
HEMATOCRIT: 39.3 % (ref 35.0–47.0)
HEMOGLOBIN: 13.6 g/dL (ref 12.0–16.0)
MCH: 30.5 pg (ref 26.0–34.0)
MCHC: 34.6 g/dL (ref 32.0–36.0)
MCV: 88.3 fL (ref 80.0–100.0)
Platelets: 175 10*3/uL (ref 150–440)
RBC: 4.45 MIL/uL (ref 3.80–5.20)
RDW: 14.7 % — AB (ref 11.5–14.5)
WBC: 6.9 10*3/uL (ref 3.6–11.0)

## 2016-12-18 LAB — INFLUENZA PANEL BY PCR (TYPE A & B)
Influenza A By PCR: NEGATIVE
Influenza B By PCR: NEGATIVE

## 2016-12-18 LAB — BASIC METABOLIC PANEL
Anion gap: 7 (ref 5–15)
BUN: 5 mg/dL — AB (ref 6–20)
CHLORIDE: 104 mmol/L (ref 101–111)
CO2: 28 mmol/L (ref 22–32)
Calcium: 9.1 mg/dL (ref 8.9–10.3)
Creatinine, Ser: 0.84 mg/dL (ref 0.44–1.00)
GFR calc Af Amer: >60 mL/min (ref >60)
GFR calc non Af Amer: >60 mL/min (ref >60)
Glucose, Bld: 115 mg/dL — ABNORMAL HIGH (ref 65–99)
POTASSIUM: 3.7 mmol/L (ref 3.5–5.1)
SODIUM: 139 mmol/L (ref 135–145)

## 2016-12-18 LAB — TROPONIN I

## 2016-12-18 LAB — BRAIN NATRIURETIC PEPTIDE: B NATRIURETIC PEPTIDE 5: 21 pg/mL (ref 0.0–100.0)

## 2016-12-18 MED ORDER — PREDNISONE 10 MG PO TABS: ORAL_TABLET | ORAL | 0 refills | Status: DC

## 2016-12-18 MED ORDER — PAROXETINE HCL 40 MG PO TABS: 40.0000 mg | ORAL_TABLET | Freq: Every day | ORAL | 0 refills | Status: DC

## 2016-12-18 MED ORDER — GABAPENTIN 800 MG PO TABS: 800.0000 mg | ORAL_TABLET | Freq: Two times a day (BID) | ORAL | 1 refills | Status: DC

## 2016-12-18 MED ORDER — IPRATROPIUM-ALBUTEROL 0.5-2.5 (3) MG/3ML IN SOLN
3.0000 mL | Freq: Once | RESPIRATORY_TRACT | Status: AC
  Administered 2016-12-18: 3 mL via RESPIRATORY_TRACT
  Filled 2016-12-18: qty 3

## 2016-12-18 MED ORDER — IBUPROFEN 600 MG PO TABS
600.0000 mg | ORAL_TABLET | Freq: Once | ORAL | Status: AC
  Administered 2016-12-18: 600 mg via ORAL
  Filled 2016-12-18: qty 1

## 2016-12-18 MED ORDER — METHYLPREDNISOLONE SODIUM SUCC 125 MG IJ SOLR
125.0000 mg | Freq: Once | INTRAMUSCULAR | Status: AC
  Administered 2016-12-18: 125 mg via INTRAVENOUS
  Filled 2016-12-18: qty 2

## 2016-12-18 MED ORDER — ALBUTEROL SULFATE HFA 108 (90 BASE) MCG/ACT IN AERS: 2.0000 | INHALATION_SPRAY | Freq: Four times a day (QID) | RESPIRATORY_TRACT | 0 refills | Status: DC | PRN

## 2016-12-18 MED ORDER — ALBUTEROL SULFATE (2.5 MG/3ML) 0.083% IN NEBU
5.0000 mg | INHALATION_SOLUTION | Freq: Once | RESPIRATORY_TRACT | Status: AC
  Administered 2016-12-18: 5 mg via RESPIRATORY_TRACT
  Filled 2016-12-18: qty 6

## 2016-12-18 NOTE — ED Triage Notes (Signed)
Per EMS report, patient c/o increased dyspnea and on-going congested cough. Patient has a history of COPD and has been out of all her meds for 3 days.

## 2016-12-18 NOTE — Discharge Instructions (Addendum)
Return to the emergency department for any worsening trouble breathing, fever, dizziness or passing out, chest pain, or any other symptoms concerning to you.

## 2016-12-18 NOTE — ED Provider Notes (Signed)
[facility] Emergency Department Provider Note ____________________________________________   I have reviewed the triage vital signs and the triage nursing note.  HISTORY  Chief Complaint Shortness of Breath   Historian Patient  HPI Caroline Coleman is a 61 y.o. female with a history of COPD, thyroid disease, anxiety, as well as chart listed depression and alcohol abuse, presents today because of shortness of breath for about 3-5 days. She states that she has been out of most of her medications for just about 1 month. She needs a new primary care doctor and states that she is on the waiting list because she has Medicaid for [facility] family practice and that should happen within the next 3 weeks.  She states that she does have her thyroid medication because she had refills on that. She states that she is out of her other medications including her inhalers, Paxil, and gabapentin.  No chest pain. Positive for wheezing and cough which is nonproductive. No altered mental status. No fever. No significant nasal congestion.    Past Medical History:  Diagnosis Date  . Anxiety   . COPD (chronic obstructive pulmonary disease) (HCC)   . Depression   . Thyroid disease     Patient Active Problem List   Diagnosis Date Noted  . Self-inflicted laceration of wrist 08/03/2016  . Substance induced mood disorder (HCC) 08/03/2016  . Alcohol abuse 08/03/2016  . Panic disorder with agoraphobia 12/14/2015  . Insomnia 12/14/2015  . Depression 12/14/2015    Past Surgical History:  Procedure Laterality Date  . EYE SURGERY    . HIP SURGERY      Prior to Admission medications   Medication Sig Start Date End Date Taking? Authorizing Provider  albuterol (PROVENTIL HFA;VENTOLIN HFA) 108 (90 Base) MCG/ACT inhaler Inhale 2 puffs into the lungs every 6 (six) hours as needed for wheezing or shortness of breath. 12/18/16   Governor Rooks, MD  ALPRAZolam Prudy Feeler) 0.25 MG tablet Take 1  tablet by mouth at bedtime. 08/13/15   Historical Provider, MD  benzonatate (TESSALON PERLES) 100 MG capsule Take 1 capsule (100 mg total) by mouth 3 (three) times daily as needed for cough (Take 1-2 per dose). 09/21/16   Jenise V Bacon Menshew, PA-C  clindamycin (CLEOCIN) 300 MG capsule Take 1 capsule (300 mg total) by mouth 3 (three) times daily. 07/17/16   Charmayne Sheer Beers, PA-C  fluticasone (FLONASE) 50 MCG/ACT nasal spray Place 2 sprays into both nostrils daily. 09/21/16   Jenise V Bacon Menshew, PA-C  gabapentin (NEURONTIN) 800 MG tablet Take 1 tablet (800 mg total) by mouth 2 (two) times daily. 07/17/16 07/17/17  Charmayne Sheer Beers, PA-C  gabapentin (NEURONTIN) 800 MG tablet Take 1 tablet (800 mg total) by mouth 2 (two) times daily. 12/18/16 12/18/17  Governor Rooks, MD  levothyroxine (SYNTHROID) 150 MCG tablet Take 1 tablet (150 mcg total) by mouth daily. 12/06/15 12/05/16  Jennye Moccasin, MD  levothyroxine (SYNTHROID) 150 MCG tablet Take 1 tablet (150 mcg total) by mouth daily. 09/21/16 09/21/17  Jenise V Bacon Menshew, PA-C  oxyCODONE-acetaminophen (ROXICET) 5-325 MG tablet Take 1-2 tablets by mouth every 4 (four) hours as needed for severe pain. 07/17/16   Charmayne Sheer Beers, PA-C  PARoxetine (PAXIL) 40 MG tablet Take 1 tablet (40 mg total) by mouth daily. 07/17/16 07/17/17  Charmayne Sheer Beers, PA-C  PARoxetine (PAXIL) 40 MG tablet Take 1 tablet (40 mg total) by mouth daily. 12/18/16 12/18/17  Governor Rooks, MD  predniSONE (DELTASONE) 10 MG  tablet 40mg  daily for 4 more days 12/18/16   Governor Rooks, MD      No family history on file.  Social History Social History  Substance Use Topics  . Smoking status: Current Every Day Smoker    Packs/day: 1.00    Types: Cigarettes  . Smokeless tobacco: Current User  . Alcohol use No    Review of Systems  Constitutional: Negative for fever. Eyes: Negative for visual changes. ENT: Negative for sore throat. Cardiovascular: Negative for chest pain. Respiratory:  Positive for shortness of breath. Gastrointestinal: Negative for abdominal pain, vomiting and diarrhea. Genitourinary: Negative for dysuria. Musculoskeletal: Negative for back pain. Skin: Negative for rash. Neurological: Negative for headache. 10 point Review of Systems otherwise negative ____________________________________________   PHYSICAL EXAM:  VITAL SIGNS: ED Triage Vitals  Enc Vitals Group     BP 12/18/16 1319 (!) 142/46     Pulse Rate 12/18/16 1319 99     Resp 12/18/16 1319 20     Temp 12/18/16 1319 98.3 F (36.8 C)     Temp Source 12/18/16 1319 Oral     SpO2 12/18/16 1311 95 %     Weight 12/18/16 1316 160 lb (72.6 kg)     Height 12/18/16 1316 5\' 4"  (1.626 m)     Head Circumference --      Peak Flow --      Pain Score 12/18/16 1316 10     Pain Loc --      Pain Edu? --      Excl. in GC? --      Constitutional: Alert and oriented. Well appearing and in no distress. HEENT   Head: Normocephalic and atraumatic.      Eyes: Conjunctivae are normal. PERRL. Normal extraocular movements.      Ears:         Nose: No congestion/rhinnorhea.   Mouth/Throat: Mucous membranes are moist.   Neck: No stridor. Cardiovascular/Chest: Regular rate, regular rhythm.  No murmurs, rubs, or gallops. Respiratory: Normal respiratory effort without tachypnea nor retractions. Moderate wheezing in all fields. Mild rhonchi posteriorly bases bilaterally. Able to speak full sentences. Gastrointestinal: Soft. No distention, no guarding, no rebound. Nontender.   Genitourinary/rectal:Deferred Musculoskeletal: Nontender with normal range of motion in all extremities. No joint effusions.  No lower extremity tenderness.  No edema. Neurologic:  Normal speech and language. No gross or focal neurologic deficits are appreciated. Skin:  Skin is warm, dry and intact. No rash noted. Psychiatric: Mood and affect are normal. Speech and behavior are normal. Patient exhibits appropriate insight and  judgment.   ____________________________________________  LABS (pertinent positives/negatives)  Labs Reviewed  BASIC METABOLIC PANEL  CBC  INFLUENZA PANEL BY PCR (TYPE A & B)  TROPONIN I  BRAIN NATRIURETIC PEPTIDE    ____________________________________________    EKG I, Governor Rooks, MD, the attending physician have personally viewed and interpreted all ECGs.  100 bpm. Sinus tachycardia. Narrow QRS. Normal axis. Normal ST and T-wave ____________________________________________  RADIOLOGY All Xrays were viewed by me. Imaging interpreted by Radiologist.  Chest 2 view: No infiltrate or pulmonary edema. Mild perihilar subtle bronchitic changes. Mild hyperinflation. __________________________________________  PROCEDURES  Procedure(s) performed: None  Critical Care performed: None  ____________________________________________   ED COURSE / ASSESSMENT AND PLAN  Pertinent labs & imaging results that were available during my care of the patient were reviewed by me and considered in my medical decision making (see chart for details).   No hypoxia, but dyspneic with wheezing consistent with COPD  exacerbation. She is otherwise overall well-appearing I don't think she is septic or has the flu with no history of fevers.  She states she thinks her lungs are out of control given the fact that she is out of her inhaler and also out of her other home medicines which she states are gabapentin and Paxil. No suicidal thoughts. No indication for emergency psychiatric eval. She states that her PCP appointment should be made within the next 3 weeks.   Reeval 3:30pm, mild wheezing,but ok for outpatient management with no hypoxia, tachypnea, and reassuring eval including negative for flu and no infiltrate on cxr.    CONSULTATIONS:  None   Patient / Family / Caregiver informed of clinical course, medical decision-making process, and agree with plan.   I discussed return  precautions, follow-up instructions, and discharge instructions with patient and/or family.   ___________________________________________   FINAL CLINICAL IMPRESSION(S) / ED DIAGNOSES   Final diagnoses:  COPD exacerbation (HCC)  Medication refill              Note: This dictation was prepared with Dragon dictation. Any transcriptional errors that result from this process are unintentional    Governor Rooksebecca Rayhaan Huster, MD 12/18/16 1547

## 2017-02-04 ENCOUNTER — Encounter: Payer: Self-pay | Admitting: Emergency Medicine

## 2017-02-04 ENCOUNTER — Emergency Department: Payer: Medicaid Other

## 2017-02-04 ENCOUNTER — Emergency Department
Admission: EM | Admit: 2017-02-04 | Discharge: 2017-02-04 | Disposition: A | Discharge status: Home / Self Care | Payer: Medicaid Other | Attending: Emergency Medicine | Admitting: Emergency Medicine

## 2017-02-04 DIAGNOSIS — B9789 Other viral agents as the cause of diseases classified elsewhere: Secondary | ICD-10-CM

## 2017-02-04 DIAGNOSIS — J441 Chronic obstructive pulmonary disease with (acute) exacerbation: Secondary | ICD-10-CM

## 2017-02-04 DIAGNOSIS — F17210 Nicotine dependence, cigarettes, uncomplicated: Secondary | ICD-10-CM | POA: Diagnosis not present

## 2017-02-04 DIAGNOSIS — Z79899 Other long term (current) drug therapy: Secondary | ICD-10-CM | POA: Diagnosis not present

## 2017-02-04 DIAGNOSIS — J449 Chronic obstructive pulmonary disease, unspecified: Secondary | ICD-10-CM | POA: Insufficient documentation

## 2017-02-04 DIAGNOSIS — J069 Acute upper respiratory infection, unspecified: Secondary | ICD-10-CM | POA: Diagnosis not present

## 2017-02-04 DIAGNOSIS — R05 Cough: Secondary | ICD-10-CM | POA: Diagnosis present

## 2017-02-04 LAB — CBC
HEMATOCRIT: 42.2 % (ref 35.0–47.0)
Hemoglobin: 14.5 g/dL (ref 12.0–16.0)
MCH: 29.8 pg (ref 26.0–34.0)
MCHC: 34.3 g/dL (ref 32.0–36.0)
MCV: 87 fL (ref 80.0–100.0)
PLATELETS: 206 10*3/uL (ref 150–440)
RBC: 4.85 MIL/uL (ref 3.80–5.20)
RDW: 14.7 % — AB (ref 11.5–14.5)
WBC: 6.5 10*3/uL (ref 3.6–11.0)

## 2017-02-04 LAB — BASIC METABOLIC PANEL
Anion gap: 8 (ref 5–15)
BUN: 7 mg/dL (ref 6–20)
CALCIUM: 9 mg/dL (ref 8.9–10.3)
CO2: 28 mmol/L (ref 22–32)
CREATININE: 0.66 mg/dL (ref 0.44–1.00)
Chloride: 102 mmol/L (ref 101–111)
GFR calc Af Amer: >60 mL/min (ref >60)
GLUCOSE: 97 mg/dL (ref 65–99)
Potassium: 3.8 mmol/L (ref 3.5–5.1)
SODIUM: 138 mmol/L (ref 135–145)

## 2017-02-04 MED ORDER — HYDROCOD POLST-CPM POLST ER 10-8 MG/5ML PO SUER: 5.0000 mL | Freq: Two times a day (BID) | ORAL | 0 refills | Status: DC

## 2017-02-04 MED ORDER — ALBUTEROL SULFATE (2.5 MG/3ML) 0.083% IN NEBU: 2.5000 mg | INHALATION_SOLUTION | RESPIRATORY_TRACT | 0 refills | Status: DC | PRN

## 2017-02-04 MED ORDER — GABAPENTIN 800 MG PO TABS: 800.0000 mg | ORAL_TABLET | Freq: Two times a day (BID) | ORAL | 0 refills | Status: DC

## 2017-02-04 MED ORDER — IPRATROPIUM-ALBUTEROL 0.5-2.5 (3) MG/3ML IN SOLN
3.0000 mL | Freq: Once | RESPIRATORY_TRACT | Status: AC
  Administered 2017-02-04: 3 mL via RESPIRATORY_TRACT
  Filled 2017-02-04: qty 3

## 2017-02-04 MED ORDER — HYDROCOD POLST-CPM POLST ER 10-8 MG/5ML PO SUER
5.0000 mL | Freq: Once | ORAL | Status: AC
  Administered 2017-02-04: 5 mL via ORAL
  Filled 2017-02-04: qty 5

## 2017-02-04 MED ORDER — PAROXETINE HCL 40 MG PO TABS: 40.0000 mg | ORAL_TABLET | Freq: Every day | ORAL | 0 refills | Status: DC

## 2017-02-04 MED ORDER — PREDNISONE 20 MG PO TABS: ORAL_TABLET | ORAL | 0 refills | Status: DC

## 2017-02-04 MED ORDER — PREDNISONE 20 MG PO TABS
60.0000 mg | ORAL_TABLET | Freq: Once | ORAL | Status: AC
  Administered 2017-02-04: 60 mg via ORAL
  Filled 2017-02-04: qty 3

## 2017-02-04 NOTE — Discharge Instructions (Signed)
1. Finish prednisone 60 mg daily 4 days. 2. You may take Tussionex as needed for cough. 3. You may use albuterol nebulizer every 4 hours as needed for coughing spasms/wheezing. 4. Restart your Paxil and Gabapentin. 5. Recent turn to the ER for worsening symptoms, persistent vomiting, difficulty breathing or other concerns.

## 2017-02-04 NOTE — ED Triage Notes (Signed)
Pt to triage via WC via EMS from hotel.  Pt reports cough, chest congestion x 2 mo, productive with yellow sputum.  Pt reports n/v x 1 tonight.  Pt reports she was seen here 2 mo ago, received steroids w/o relief.  Pt hx of COPD. Pt NAD at this time.

## 2017-02-04 NOTE — ED Notes (Signed)
Patient c/o productive cough, nasal congestion/drainage, SOB.   Pt c/o abdominal pain.   Pt reports hx of right hip surgery with chronic pain since.  Pt reports fall 2 days ago, with increasing right hip pain, and facial pain since.  Pt denies dizziness, pt reports leg became weak. Pt has bruising to right eye, pt reports it as result of fall.

## 2017-02-04 NOTE — ED Notes (Signed)
ED Provider at bedside. 

## 2017-02-04 NOTE — ED Notes (Signed)
Reviewed d/c instructions, follow-up care, prescriptions with patient. Pt verbalized understanding.  

## 2017-02-04 NOTE — ED Provider Notes (Signed)
[facility] Emergency Department Provider Note   ____________________________________________   First MD Initiated Contact with Patient 02/04/17 [phone]     (approximate)  I have reviewed the triage vital signs and the nursing notes.   HISTORY  Chief Complaint Cough    HPI Caroline Coleman is a 61 y.o. female brought to the ED from hotel via EMS with a chief complaint of cough, chest congestion, wheezing, and posttussive emesis. Patient with a history of COPD who lives in a hotel regularly. States she has been sick for the past 2 months with cough and chest congestion. Reports cough productive of yellow sputum with increased wheezing tonight. States she fell 2 days ago secondary to generalized weakness and complains of acute on chronic right hip pain. Denies associated fever, chills, chest pain, abdominal pain, diarrhea. Denies recent travel or trauma. Nothing makes her symptoms better or worse.   Past Medical History:  Diagnosis Date  . Anxiety   . COPD (chronic obstructive pulmonary disease) (HCC)   . Depression   . Thyroid disease     Patient Active Problem List   Diagnosis Date Noted  . Self-inflicted laceration of wrist 08/03/2016  . Substance induced mood disorder (HCC) 08/03/2016  . Alcohol abuse 08/03/2016  . Panic disorder with agoraphobia 12/14/2015  . Insomnia 12/14/2015  . Depression 12/14/2015    Past Surgical History:  Procedure Laterality Date  . EYE SURGERY    . HIP SURGERY      Prior to Admission medications   Medication Sig Start Date End Date Taking? Authorizing Provider  albuterol (PROVENTIL HFA;VENTOLIN HFA) 108 (90 Base) MCG/ACT inhaler Inhale 2 puffs into the lungs every 6 (six) hours as needed for wheezing or shortness of breath. 12/18/16   Governor Rooks, MD  ALPRAZolam Prudy Feeler) 0.25 MG tablet Take 1 tablet by mouth at bedtime. 08/13/15   Historical Provider, MD  benzonatate (TESSALON PERLES) 100 MG capsule Take 1 capsule  (100 mg total) by mouth 3 (three) times daily as needed for cough (Take 1-2 per dose). 09/21/16   Jenise V Bacon Menshew, PA-C  clindamycin (CLEOCIN) 300 MG capsule Take 1 capsule (300 mg total) by mouth 3 (three) times daily. 07/17/16   Charmayne Sheer Beers, PA-C  fluticasone (FLONASE) 50 MCG/ACT nasal spray Place 2 sprays into both nostrils daily. 09/21/16   Jenise V Bacon Menshew, PA-C  gabapentin (NEURONTIN) 800 MG tablet Take 1 tablet (800 mg total) by mouth 2 (two) times daily. 07/17/16 07/17/17  Charmayne Sheer Beers, PA-C  gabapentin (NEURONTIN) 800 MG tablet Take 1 tablet (800 mg total) by mouth 2 (two) times daily. 12/18/16 12/18/17  Governor Rooks, MD  levothyroxine (SYNTHROID) 150 MCG tablet Take 1 tablet (150 mcg total) by mouth daily. 12/06/15 12/05/16  Jennye Moccasin, MD  levothyroxine (SYNTHROID) 150 MCG tablet Take 1 tablet (150 mcg total) by mouth daily. 09/21/16 09/21/17  Jenise V Bacon Menshew, PA-C  oxyCODONE-acetaminophen (ROXICET) 5-325 MG tablet Take 1-2 tablets by mouth every 4 (four) hours as needed for severe pain. 07/17/16   Charmayne Sheer Beers, PA-C  PARoxetine (PAXIL) 40 MG tablet Take 1 tablet (40 mg total) by mouth daily. 07/17/16 07/17/17  Charmayne Sheer Beers, PA-C  PARoxetine (PAXIL) 40 MG tablet Take 1 tablet (40 mg total) by mouth daily. 12/18/16 12/18/17  Governor Rooks, MD  predniSONE (DELTASONE) 10 MG tablet 40mg  daily for 4 more days 12/18/16   Governor Rooks, MD    Allergies Penicillins and Sulfa antibiotics  History  reviewed. No pertinent family history.  Social History Social History  Substance Use Topics  . Smoking status: Current Every Day Smoker    Packs/day: 1.00    Types: Cigarettes  . Smokeless tobacco: Current User  . Alcohol use No    Review of Systems  Constitutional: No fever/chills. Eyes: No visual changes. ENT: No sore throat. Cardiovascular: Positive for chest congestion. Denies chest pain. Respiratory: Positive for productive cough and wheezing. Denies shortness of  breath. Gastrointestinal: No abdominal pain.  No nausea, no vomiting.  No diarrhea.  No constipation. Genitourinary: Negative for dysuria. Musculoskeletal: Negative for back pain. Skin: Negative for rash. Neurological: Negative for headaches, focal weakness or numbness.  10-point ROS otherwise negative.  ____________________________________________   PHYSICAL EXAM:  VITAL SIGNS: ED Triage Vitals [02/04/17 0110]  Enc Vitals Group     BP 118/80     Pulse Rate 97     Resp 20     Temp 97.9 F (36.6 C)     Temp Source Oral     SpO2 98 %     Weight 160 lb (72.6 kg)     Height 5\' 4"  (1.626 m)     Head Circumference      Peak Flow      Pain Score 7     Pain Loc      Pain Edu?      Excl. in GC?      Constitutional: Alert and oriented. Well appearing and in mild acute distress. Active loose, rattling cough. Eyes: Old right periorbital eccymosis. Conjunctivae are normal. PERRL. EOMI. Head: Atraumatic. Nose: No congestion/rhinnorhea. Mouth/Throat: Mucous membranes are moist.  Oropharynx non-erythematous. Neck: No stridor.   Cardiovascular: Normal rate, regular rhythm. Grossly normal heart sounds.  Good peripheral circulation. Respiratory: Normal respiratory effort.  No retractions. Lungs with diffuse wheezing. Gastrointestinal: Soft and nontender. No distention. No abdominal bruits. No CVA tenderness. Musculoskeletal: Pelvis stable. Right hip tender to palpation. No limb shortening or rotation. No lower extremity edema.  No joint effusions. Neurologic:  Normal speech and language. No gross focal neurologic deficits are appreciated. No gait instability. Skin:  Skin is warm, dry and intact. No rash noted. Psychiatric: Mood and affect are normal. Speech and behavior are normal.  ____________________________________________   LABS (all labs ordered are listed, but only abnormal results are displayed)  Labs Reviewed  CBC - Abnormal; Notable for the following:       Result Value    RDW 14.7 (*)    All other components within normal limits  BASIC METABOLIC PANEL   ____________________________________________  EKG  ED ECG REPORT I, Tawonda Legaspi J, the attending physician, personally viewed and interpreted this ECG.   Date: 02/04/2017  EKG Time: 0113  Rate: 93  Rhythm: normal EKG, normal sinus rhythm  Axis: Normal  Intervals:none  ST&T Change: Nonspecific  ____________________________________________  RADIOLOGY  Chest x-ray interpreted per Dr. Clovis Riley: Hyperinflation. No consolidation or effusion. Normal vasculature.  Right hip xray interpreted per Dr. Clovis Riley: Prior intramedullary right hip fixation. No acute findings. ____________________________________________   PROCEDURES  Procedure(s) performed: None  Procedures  Critical Care performed: No  ____________________________________________   INITIAL IMPRESSION / ASSESSMENT AND PLAN / ED COURSE  Pertinent labs & imaging results that were available during my care of the patient were reviewed by me and considered in my medical decision making (see chart for details).  61 year old female with COPD who presents with productive cough and wheezing. Will initiate prednisone, Tussionex and DuoNeb.  Clinical Course  as of Feb 04 634  Sat Feb 04, 2017  0529 Aeration improved, wheezing lessened after DuoNeb. Patient is feeling much better. Coughing spasm has resolved. Resting in no acute distress. Will continue to monitor.  [JS]  [id number] Wheezing resolved. Patient is feeling much better. Will refill prescriptions for Paxil, gabapentin as well as prescribed nebulizer machine with albuterol solution. This is in addition to prednisone burst and Tussionex. Strict return precautions given. Patient verbalizes understanding and agrees with plan of care.  [JS]    Clinical Course User Index [JS] Irean HongJade J Leona Alen, MD     ____________________________________________   FINAL CLINICAL IMPRESSION(S) / ED  DIAGNOSES  Final diagnoses:  Chronic obstructive pulmonary disease with acute exacerbation (HCC)  Viral URI with cough      NEW MEDICATIONS STARTED DURING THIS VISIT:  New Prescriptions   No medications on file     Note:  This document was prepared using Dragon voice recognition software and may include unintentional dictation errors.    Irean HongJade J Anah Billard, MD 02/04/17 [phone]

## 2017-03-26 ENCOUNTER — Encounter: Payer: Self-pay | Admitting: Emergency Medicine

## 2017-03-26 ENCOUNTER — Emergency Department
Admission: EM | Admit: 2017-03-26 | Discharge: 2017-03-26 | Disposition: A | Discharge status: Home / Self Care | Payer: Medicaid Other | Attending: Emergency Medicine | Admitting: Emergency Medicine

## 2017-03-26 ENCOUNTER — Emergency Department: Payer: Medicaid Other

## 2017-03-26 DIAGNOSIS — J40 Bronchitis, not specified as acute or chronic: Secondary | ICD-10-CM | POA: Diagnosis not present

## 2017-03-26 DIAGNOSIS — F17210 Nicotine dependence, cigarettes, uncomplicated: Secondary | ICD-10-CM | POA: Insufficient documentation

## 2017-03-26 DIAGNOSIS — J441 Chronic obstructive pulmonary disease with (acute) exacerbation: Secondary | ICD-10-CM | POA: Insufficient documentation

## 2017-03-26 DIAGNOSIS — R0602 Shortness of breath: Secondary | ICD-10-CM | POA: Diagnosis present

## 2017-03-26 DIAGNOSIS — Z79899 Other long term (current) drug therapy: Secondary | ICD-10-CM | POA: Insufficient documentation

## 2017-03-26 LAB — TROPONIN I: Troponin I: <0.03 ng/mL (ref <0.03)

## 2017-03-26 LAB — BASIC METABOLIC PANEL
Anion gap: 9 (ref 5–15)
BUN: 7 mg/dL (ref 6–20)
CHLORIDE: 104 mmol/L (ref 101–111)
CO2: 26 mmol/L (ref 22–32)
CREATININE: 0.89 mg/dL (ref 0.44–1.00)
Calcium: 9.6 mg/dL (ref 8.9–10.3)
Glucose, Bld: 97 mg/dL (ref 65–99)
POTASSIUM: 3.5 mmol/L (ref 3.5–5.1)
SODIUM: 139 mmol/L (ref 135–145)

## 2017-03-26 LAB — CBC
HCT: 45.3 % (ref 35.0–47.0)
HEMOGLOBIN: 14.9 g/dL (ref 12.0–16.0)
MCH: 29.7 pg (ref 26.0–34.0)
MCHC: 33 g/dL (ref 32.0–36.0)
MCV: 89.9 fL (ref 80.0–100.0)
Platelets: 215 10*3/uL (ref 150–440)
RBC: 5.04 MIL/uL (ref 3.80–5.20)
RDW: 14.9 % — ABNORMAL HIGH (ref 11.5–14.5)
WBC: 8 10*3/uL (ref 3.6–11.0)

## 2017-03-26 LAB — BRAIN NATRIURETIC PEPTIDE: B NATRIURETIC PEPTIDE 5: 8 pg/mL (ref 0.0–100.0)

## 2017-03-26 MED ORDER — LEVOTHYROXINE SODIUM 150 MCG PO TABS: 150.0000 ug | ORAL_TABLET | Freq: Every day | ORAL | 0 refills | Status: DC

## 2017-03-26 MED ORDER — IPRATROPIUM-ALBUTEROL 0.5-2.5 (3) MG/3ML IN SOLN
3.0000 mL | Freq: Once | RESPIRATORY_TRACT | Status: AC
  Administered 2017-03-26: 3 mL via RESPIRATORY_TRACT
  Filled 2017-03-26: qty 3

## 2017-03-26 MED ORDER — ALBUTEROL SULFATE (2.5 MG/3ML) 0.083% IN NEBU
2.5000 mg | INHALATION_SOLUTION | Freq: Once | RESPIRATORY_TRACT | Status: AC
  Administered 2017-03-26: 2.5 mg via RESPIRATORY_TRACT
  Filled 2017-03-26: qty 3

## 2017-03-26 MED ORDER — BUTALBITAL-APAP-CAFFEINE 50-325-40 MG PO TABS
2.0000 | ORAL_TABLET | Freq: Once | ORAL | Status: AC
  Administered 2017-03-26: 2 via ORAL
  Filled 2017-03-26: qty 2

## 2017-03-26 MED ORDER — METHYLPREDNISOLONE SODIUM SUCC 125 MG IJ SOLR
125.0000 mg | Freq: Once | INTRAMUSCULAR | Status: AC
  Administered 2017-03-26: 125 mg via INTRAVENOUS
  Filled 2017-03-26: qty 2

## 2017-03-26 MED ORDER — GABAPENTIN 800 MG PO TABS: 800.0000 mg | ORAL_TABLET | Freq: Two times a day (BID) | ORAL | 0 refills | Status: DC

## 2017-03-26 MED ORDER — ALBUTEROL SULFATE (2.5 MG/3ML) 0.083% IN NEBU
5.0000 mg | INHALATION_SOLUTION | Freq: Once | RESPIRATORY_TRACT | Status: AC
  Administered 2017-03-26: 5 mg via RESPIRATORY_TRACT
  Filled 2017-03-26: qty 6

## 2017-03-26 MED ORDER — AZITHROMYCIN 250 MG PO TABS: ORAL_TABLET | ORAL | 0 refills | Status: AC

## 2017-03-26 MED ORDER — ALBUTEROL SULFATE HFA 108 (90 BASE) MCG/ACT IN AERS: 2.0000 | INHALATION_SPRAY | Freq: Four times a day (QID) | RESPIRATORY_TRACT | 2 refills | Status: DC | PRN

## 2017-03-26 MED ORDER — MAGNESIUM SULFATE 2 GM/50ML IV SOLN
2.0000 g | Freq: Once | INTRAVENOUS | Status: AC
  Administered 2017-03-26: 2 g via INTRAVENOUS
  Filled 2017-03-26: qty 50

## 2017-03-26 MED ORDER — PAROXETINE HCL 40 MG PO TABS: 40.0000 mg | ORAL_TABLET | Freq: Every day | ORAL | 0 refills | Status: DC

## 2017-03-26 MED ORDER — PREDNISONE 20 MG PO TABS: 60.0000 mg | ORAL_TABLET | Freq: Every day | ORAL | 0 refills | Status: DC

## 2017-03-26 MED ORDER — AZITHROMYCIN 500 MG PO TABS
500.0000 mg | ORAL_TABLET | Freq: Once | ORAL | Status: AC
  Administered 2017-03-26: 500 mg via ORAL
  Filled 2017-03-26: qty 1

## 2017-03-26 NOTE — ED Notes (Signed)
ED Provider at bedside. 

## 2017-03-26 NOTE — ED Provider Notes (Signed)
[facility] Emergency Department Provider Note   ____________________________________________   First MD Initiated Contact with Patient 03/26/17 [phone]     (approximate)  I have reviewed the triage vital signs and the nursing notes.   HISTORY  Chief Complaint Shortness of Breath    HPI Caroline Coleman is a 61 y.o. female who comes into the hospital today with some breathing problems. The patient reports that she has a history of COPD and she's been coughing. She also reports that her bladder is bad never time she coughs she wets herself. She's had some shortness of breath with exertion as well as some pressure on her head. She feels like her head is busting open and this pressure behind her face. The patient reports that she has to sleep on multiple pillows. She did a breathing treatment and reports that some mucus this, but she still feeling short of breath. She reports that typically when she is this bad she needs prednisone to clear it up. Over-the-counter medications did not help. The patient has had some nausea and felt hot and cold. She has not had any measured fevers. She reports that the pollen has really been getting to her. The patient also does not have a primary care physician and is out of her thyroid medication and her Paxil. The patient is here tonight for evaluation.   Past Medical History:  Diagnosis Date  . Anxiety   . COPD (chronic obstructive pulmonary disease) (HCC)   . Depression   . Thyroid disease     Patient Active Problem List   Diagnosis Date Noted  . Self-inflicted laceration of wrist 08/03/2016  . Substance induced mood disorder (HCC) 08/03/2016  . Alcohol abuse 08/03/2016  . Panic disorder with agoraphobia 12/14/2015  . Insomnia 12/14/2015  . Depression 12/14/2015    Past Surgical History:  Procedure Laterality Date  . EYE SURGERY    . HIP SURGERY      Prior to Admission medications   Medication Sig Start Date  End Date Taking? Authorizing Provider  albuterol (PROVENTIL HFA;VENTOLIN HFA) 108 (90 Base) MCG/ACT inhaler Inhale 2 puffs into the lungs every 6 (six) hours as needed for wheezing or shortness of breath. 03/26/17   Rebecka Apley, MD  albuterol (PROVENTIL) (2.5 MG/3ML) 0.083% nebulizer solution Take 3 mLs (2.5 mg total) by nebulization every 4 (four) hours as needed for wheezing or shortness of breath. 02/04/17   Irean Hong, MD  ALPRAZolam Prudy Feeler) 0.25 MG tablet Take 1 tablet by mouth at bedtime. 08/13/15   Historical Provider, MD  azithromycin (ZITHROMAX Z-PAK) 250 MG tablet Take 2 tablets (500 mg) on  Day 1,  followed by 1 tablet (250 mg) once daily on Days 2 through 5. 03/26/17 03/31/17  Rebecka Apley, MD  benzonatate (TESSALON PERLES) 100 MG capsule Take 1 capsule (100 mg total) by mouth 3 (three) times daily as needed for cough (Take 1-2 per dose). 09/21/16   Jenise V Bacon Menshew, PA-C  chlorpheniramine-HYDROcodone (TUSSIONEX PENNKINETIC ER) 10-8 MG/5ML SUER Take 5 mLs by mouth 2 (two) times daily. 02/04/17   Irean Hong, MD  clindamycin (CLEOCIN) 300 MG capsule Take 1 capsule (300 mg total) by mouth 3 (three) times daily. 07/17/16   Charmayne Sheer Beers, PA-C  fluticasone (FLONASE) 50 MCG/ACT nasal spray Place 2 sprays into both nostrils daily. 09/21/16   Jenise V Bacon Menshew, PA-C  gabapentin (NEURONTIN) 800 MG tablet Take 1 tablet (800 mg total) by mouth 2 (  two) times daily. 02/04/17 02/04/18  Irean Hong, MD  gabapentin (NEURONTIN) 800 MG tablet Take 1 tablet (800 mg total) by mouth 2 (two) times daily. 03/26/17 03/26/18  Rebecka Apley, MD  levothyroxine (SYNTHROID) 150 MCG tablet Take 1 tablet (150 mcg total) by mouth daily. 12/06/15 12/05/16  Jennye Moccasin, MD  levothyroxine (SYNTHROID) 150 MCG tablet Take 1 tablet (150 mcg total) by mouth daily. 09/21/16 09/21/17  Jenise V Bacon Menshew, PA-C  levothyroxine (SYNTHROID) 150 MCG tablet Take 1 tablet (150 mcg total) by mouth daily. 03/26/17  03/26/18  Rebecka Apley, MD  oxyCODONE-acetaminophen (ROXICET) 5-325 MG tablet Take 1-2 tablets by mouth every 4 (four) hours as needed for severe pain. 07/17/16   Charmayne Sheer Beers, PA-C  PARoxetine (PAXIL) 40 MG tablet Take 1 tablet (40 mg total) by mouth daily. 02/04/17 02/04/18  Irean Hong, MD  PARoxetine (PAXIL) 40 MG tablet Take 1 tablet (40 mg total) by mouth daily. 03/26/17 03/26/18  Rebecka Apley, MD  predniSONE (DELTASONE) 20 MG tablet 3 tablets daily x 4 days 02/04/17   Irean Hong, MD  predniSONE (DELTASONE) 20 MG tablet Take 3 tablets (60 mg total) by mouth daily. 03/26/17   Rebecka Apley, MD    Allergies Penicillins and Sulfa antibiotics  History reviewed. No pertinent family history.  Social History Social History  Substance Use Topics  . Smoking status: Current Every Day Smoker    Packs/day: 1.00    Types: Cigarettes  . Smokeless tobacco: Current User  . Alcohol use No    Review of Systems  Constitutional: No fever/chills Eyes: No visual changes. ENT: No sore throat. Cardiovascular: Denies chest pain. Respiratory: Cough and shortness of breath Gastrointestinal: No abdominal pain.  No nausea, no vomiting.  No diarrhea.  No constipation. Genitourinary: Negative for dysuria. Musculoskeletal: Negative for back pain. Skin: Negative for rash. Neurological: Headache   ____________________________________________   PHYSICAL EXAM:  VITAL SIGNS: ED Triage Vitals  Enc Vitals Group     BP 03/26/17 0123 130/70     Pulse Rate 03/26/17 0123 91     Resp 03/26/17 0123 20     Temp 03/26/17 0123 97.9 F (36.6 C)     Temp Source 03/26/17 0123 Oral     SpO2 03/26/17 0123 98 %     Weight 03/26/17 0124 160 lb (72.6 kg)     Height 03/26/17 0124  (1.626 m)     Head Circumference --      Peak Flow --      Pain Score --      Pain Loc --      Pain Edu? --      Excl. in GC? --     Constitutional: Alert and oriented. Well appearing and in Moderate  distress. Eyes: Conjunctivae are normal. PERRL. EOMI. Head: Atraumatic. Nose: No congestion/rhinnorhea. Mouth/Throat: Mucous membranes are moist.  Oropharynx non-erythematous. Cardiovascular: Normal rate, regular rhythm. Grossly normal heart sounds.  Good peripheral circulation. Respiratory: Increased respiratory effort.  No retractions. Expiratory wheezing in all lung fields Gastrointestinal: Soft and nontender. No distention. Positive bowel sounds Musculoskeletal: No lower extremity tenderness nor edema.   Neurologic:  Normal speech and language. Skin:  Skin is warm, dry and intact. Marland Kitchen Psychiatric: Mood and affect are normal.   ____________________________________________   LABS (all labs ordered are listed, but only abnormal results are displayed)  Labs Reviewed  CBC - Abnormal; Notable for the following:  Result Value   RDW 14.9 (*)    All other components within normal limits  BASIC METABOLIC PANEL  TROPONIN I  BRAIN NATRIURETIC PEPTIDE  TROPONIN I   ____________________________________________  EKG  ED ECG REPORT I, WebsRebecka Apley the attending physician, personally viewed and interpreted this ECG.   Date: 03/26/2017  EKG Time: 522  Rate: 106  Rhythm: sinus tachycardia  Axis: normal  Intervals:prolonged qtc  ST&T Change: none  ____________________________________________  RADIOLOGY  CXR ____________________________________________   PROCEDURES  Procedure(s) performed: None  Procedures  Critical Care performed: No  ____________________________________________   INITIAL IMPRESSION / ASSESSMENT AND PLAN / ED COURSE  Pertinent labs & imaging results that were available during my care of the patient were reviewed by me and considered in my medical decision making (see chart for details).  This is a 89-year-old female who comes into the hospital today with some shortness of breath and wheezing. The patient did receive albuterol by EMS. I did  give the patient some Solu-Medrol as well as DuoNeb's and some azithromycin. The patient's blood work is unremarkable and her chest xray shows some hyperinflation. I gave the patient some fioricet for her headache and I will reassess the patient.  Clinical Course as of Mar 26 710  Sun Mar 26, 2017  0214 No active cardiopulmonary disease.  Hyperinflated lungs. DG Chest 2 View [AW]    Clinical Course User Index [AW] Rebecka Apley, MD   The patient did receive some magnesium sulfate and another albuterol treatment for continued wheezing but she reports that she did feel improved. Her tachycardia did improve as well as her wheezing. The patient will be discharged home. I will write the patient some prescriptions for her maintenance medications but encouraged her to follow up. The patient will be discharged home. I have encouraged her to return should she have any worsening condition.  ____________________________________________   FINAL CLINICAL IMPRESSION(S) / ED DIAGNOSES  Final diagnoses:  COPD exacerbation (HCC)  Bronchitis      NEW MEDICATIONS STARTED DURING THIS VISIT:  New Prescriptions   ALBUTEROL (PROVENTIL HFA;VENTOLIN HFA) 108 (90 BASE) MCG/ACT INHALER    Inhale 2 puffs into the lungs every 6 (six) hours as needed for wheezing or shortness of breath.   AZITHROMYCIN (ZITHROMAX Z-PAK) 250 MG TABLET    Take 2 tablets (500 mg) on  Day 1,  followed by 1 tablet (250 mg) once daily on Days 2 through 5.   GABAPENTIN (NEURONTIN) 800 MG TABLET    Take 1 tablet (800 mg total) by mouth 2 (two) times daily.   LEVOTHYROXINE (SYNTHROID) 150 MCG TABLET    Take 1 tablet (150 mcg total) by mouth daily.   PAROXETINE (PAXIL) 40 MG TABLET    Take 1 tablet (40 mg total) by mouth daily.   PREDNISONE (DELTASONE) 20 MG TABLET    Take 3 tablets (60 mg total) by mouth daily.     Note:  This document was prepared using Dragon voice recognition software and may include unintentional dictation  errors.    Rebecka Apley, MD 03/26/17 [phone]

## 2017-06-05 ENCOUNTER — Encounter: Payer: Self-pay | Admitting: *Deleted

## 2017-06-05 DIAGNOSIS — J449 Chronic obstructive pulmonary disease, unspecified: Secondary | ICD-10-CM | POA: Insufficient documentation

## 2017-06-05 DIAGNOSIS — R1084 Generalized abdominal pain: Secondary | ICD-10-CM | POA: Insufficient documentation

## 2017-06-05 DIAGNOSIS — E079 Disorder of thyroid, unspecified: Secondary | ICD-10-CM | POA: Insufficient documentation

## 2017-06-05 DIAGNOSIS — R110 Nausea: Secondary | ICD-10-CM | POA: Insufficient documentation

## 2017-06-05 DIAGNOSIS — F17210 Nicotine dependence, cigarettes, uncomplicated: Secondary | ICD-10-CM | POA: Diagnosis not present

## 2017-06-05 DIAGNOSIS — Z79899 Other long term (current) drug therapy: Secondary | ICD-10-CM | POA: Insufficient documentation

## 2017-06-05 DIAGNOSIS — R109 Unspecified abdominal pain: Secondary | ICD-10-CM | POA: Diagnosis present

## 2017-06-05 LAB — URINALYSIS, COMPLETE (UACMP) WITH MICROSCOPIC
Bacteria, UA: NONE SEEN
Bilirubin Urine: NEGATIVE
GLUCOSE, UA: NEGATIVE mg/dL
HGB URINE DIPSTICK: NEGATIVE
Ketones, ur: NEGATIVE mg/dL
Leukocytes, UA: NEGATIVE
Nitrite: NEGATIVE
PH: 7 (ref 5.0–8.0)
PROTEIN: NEGATIVE mg/dL
RBC / HPF: NONE SEEN RBC/hpf (ref 0–5)
SPECIFIC GRAVITY, URINE: 1.003 — AB (ref 1.005–1.030)
Squamous Epithelial / LPF: NONE SEEN

## 2017-06-05 LAB — COMPREHENSIVE METABOLIC PANEL
ALK PHOS: 61 U/L (ref 38–126)
ALT: 10 U/L — AB (ref 14–54)
AST: 21 U/L (ref 15–41)
Albumin: 4.4 g/dL (ref 3.5–5.0)
Anion gap: 7 (ref 5–15)
BUN: 7 mg/dL (ref 6–20)
CALCIUM: 9.5 mg/dL (ref 8.9–10.3)
CO2: 29 mmol/L (ref 22–32)
CREATININE: 0.78 mg/dL (ref 0.44–1.00)
Chloride: 103 mmol/L (ref 101–111)
Glucose, Bld: 80 mg/dL (ref 65–99)
Potassium: 4 mmol/L (ref 3.5–5.1)
SODIUM: 139 mmol/L (ref 135–145)
Total Bilirubin: 0.4 mg/dL (ref 0.3–1.2)
Total Protein: 7.6 g/dL (ref 6.5–8.1)

## 2017-06-05 LAB — CBC
HCT: 40.8 % (ref 35.0–47.0)
Hemoglobin: 13.7 g/dL (ref 12.0–16.0)
MCH: 29.4 pg (ref 26.0–34.0)
MCHC: 33.5 g/dL (ref 32.0–36.0)
MCV: 87.7 fL (ref 80.0–100.0)
PLATELETS: 184 10*3/uL (ref 150–440)
RBC: 4.66 MIL/uL (ref 3.80–5.20)
RDW: 15.2 % — ABNORMAL HIGH (ref 11.5–14.5)
WBC: 6.5 10*3/uL (ref 3.6–11.0)

## 2017-06-05 LAB — TROPONIN I

## 2017-06-05 LAB — LIPASE, BLOOD: Lipase: 33 U/L (ref 11–51)

## 2017-06-05 NOTE — ED Triage Notes (Signed)
Pt brought in via EMS for abdominal pain for 2 days with pressure in chest intermittently, pt complains of nausea , pt denies vomiting

## 2017-06-06 ENCOUNTER — Emergency Department
Admission: EM | Admit: 2017-06-06 | Discharge: 2017-06-06 | Disposition: A | Discharge status: Home / Self Care | Payer: Medicaid Other | Attending: Emergency Medicine | Admitting: Emergency Medicine

## 2017-06-06 ENCOUNTER — Emergency Department: Payer: Medicaid Other

## 2017-06-06 DIAGNOSIS — R1084 Generalized abdominal pain: Secondary | ICD-10-CM

## 2017-06-06 DIAGNOSIS — R110 Nausea: Secondary | ICD-10-CM

## 2017-06-06 LAB — ETHANOL: Alcohol, Ethyl (B): <5 mg/dL (ref <5)

## 2017-06-06 LAB — TROPONIN I

## 2017-06-06 MED ORDER — LEVOTHYROXINE SODIUM 150 MCG PO TABS: 150.0000 ug | ORAL_TABLET | Freq: Every day | ORAL | 0 refills | Status: DC

## 2017-06-06 MED ORDER — IOPAMIDOL (ISOVUE-300) INJECTION 61%
100.0000 mL | Freq: Once | INTRAVENOUS | Status: AC | PRN
  Administered 2017-06-06: 100 mL via INTRAVENOUS

## 2017-06-06 MED ORDER — GABAPENTIN 400 MG PO CAPS: 400.0000 mg | ORAL_CAPSULE | Freq: Two times a day (BID) | ORAL | 0 refills | Status: DC

## 2017-06-06 MED ORDER — PAROXETINE HCL 40 MG PO TABS: 40.0000 mg | ORAL_TABLET | Freq: Every day | ORAL | 0 refills | Status: DC

## 2017-06-06 MED ORDER — MORPHINE SULFATE (PF) 4 MG/ML IV SOLN
4.0000 mg | Freq: Once | INTRAVENOUS | Status: AC
  Administered 2017-06-06: 4 mg via INTRAVENOUS
  Filled 2017-06-06: qty 1

## 2017-06-06 MED ORDER — METOCLOPRAMIDE HCL 10 MG PO TABS: 10.0000 mg | ORAL_TABLET | Freq: Four times a day (QID) | ORAL | 0 refills | Status: DC | PRN

## 2017-06-06 MED ORDER — ONDANSETRON 4 MG PO TBDP: 4.0000 mg | ORAL_TABLET | Freq: Three times a day (TID) | ORAL | 0 refills | Status: DC | PRN

## 2017-06-06 MED ORDER — ONDANSETRON HCL 4 MG/2ML IJ SOLN
4.0000 mg | Freq: Once | INTRAMUSCULAR | Status: AC
  Administered 2017-06-06: 4 mg via INTRAVENOUS
  Filled 2017-06-06: qty 2

## 2017-06-06 NOTE — ED Provider Notes (Signed)
Signout from Dr. Dolores FrameSung in this 61-year-old female with epigastric pain radiating to her lower abdomen. Plan is to follow-up with the CAT scan and disposition appropriately.   Physical Exam  BP 120/77   Pulse 62   Temp 99 F (37.2 C) (Oral)   Resp 18   Ht 5\' 4"  (1.626 m)   Wt 72.6 kg (160 lb)   SpO2 99%   BMI 27.46 kg/m  ----------------------------------------- 9:12 AM on 06/06/2017 -----------------------------------------   Physical Exam Patient without any distress at this time. Resting without any objective observation of pain. Abdomen palpated and it is soft and nontender at this time. ED Course  Procedures  MDM Henk at a doctor's mildly prominent on the CT scan. No masses seen by CT but MR is recommended. Otherwise, there is no other acute finding. I reviewed the findings with the patient and she is aware that she'll need an MRI of her pancreas in the office. [facility] She says that her primary care doctor is no longer practicing. She is also requesting refills of her Neurontin, Paxil and thyroid medication. Patient will be discharged home at this time. Tolerating by mouth contrast without any further nausea or vomiting.       Myrna BlazerSchaevitz, Diogo Anne Matthew, MD 06/06/17 [phone]

## 2017-06-06 NOTE — ED Notes (Signed)
CT notified pt finished with contrast. 

## 2017-06-06 NOTE — Discharge Instructions (Signed)
1.  You may take Zofran as needed for nausea. 2.  Return to the ER for worsening symptoms, persistent vomiting, difficulty breathing or other concerns. 

## 2017-06-06 NOTE — ED Provider Notes (Signed)
[facility] Emergency Department Provider Note   ____________________________________________   First MD Initiated Contact with Patient 06/06/17 [phone]     (approximate)  I have reviewed the triage vital signs and the nursing notes.   HISTORY  Chief Complaint Abdominal Pain    HPI Caroline Coleman is a 61 y.o. female brought to the ED from home via EMS with a chief complaint of abdominal pain. Patient reports generalized abdominal pain for the past 2 days, occasionally radiating into her chest. States pain starts in her epigastrium and radiates to her lower abdomen. Symptoms associated with nausea. Denies associated radiation, fever, chills, shortness of breath, vomiting, diarrhea. States she is constantly peeing without dysuria. Denies recent travel or trauma. Nothing makes her symptoms better or worse.   Past Medical History:  Diagnosis Date  . Anxiety   . COPD (chronic obstructive pulmonary disease) (HCC)   . Depression   . Thyroid disease     Patient Active Problem List   Diagnosis Date Noted  . Self-inflicted laceration of wrist 08/03/2016  . Substance induced mood disorder (HCC) 08/03/2016  . Alcohol abuse 08/03/2016  . Panic disorder with agoraphobia 12/14/2015  . Insomnia 12/14/2015  . Depression 12/14/2015    Past Surgical History:  Procedure Laterality Date  . EYE SURGERY    . HIP SURGERY      Prior to Admission medications   Medication Sig Start Date End Date Taking? Authorizing Provider  albuterol (PROVENTIL HFA;VENTOLIN HFA) 108 (90 Base) MCG/ACT inhaler Inhale 2 puffs into the lungs every 6 (six) hours as needed for wheezing or shortness of breath. 03/26/17   Rebecka ApleyWebster, Allison P, MD  albuterol (PROVENTIL) (2.5 MG/3ML) 0.083% nebulizer solution Take 3 mLs (2.5 mg total) by nebulization every 4 (four) hours as needed for wheezing or shortness of breath. 02/04/17   Irean HongSung, Jade J, MD  ALPRAZolam Prudy Feeler(XANAX) 0.25 MG tablet Take 1 tablet by  mouth at bedtime. 08/13/15   [provider]  benzonatate (TESSALON PERLES) 100 MG capsule Take 1 capsule (100 mg total) by mouth 3 (three) times daily as needed for cough (Take 1-2 per dose). 09/21/16   Menshew, Charlesetta IvoryJenise V Bacon, PA-C  chlorpheniramine-HYDROcodone (TUSSIONEX PENNKINETIC ER) 10-8 MG/5ML SUER Take 5 mLs by mouth 2 (two) times daily. 02/04/17   Irean HongSung, Jade J, MD  clindamycin (CLEOCIN) 300 MG capsule Take 1 capsule (300 mg total) by mouth 3 (three) times daily. 07/17/16   Beers, Charmayne Sheerharles M, PA-C  fluticasone (FLONASE) 50 MCG/ACT nasal spray Place 2 sprays into both nostrils daily. 09/21/16   Menshew, Charlesetta IvoryJenise V Bacon, PA-C  gabapentin (NEURONTIN) 800 MG tablet Take 1 tablet (800 mg total) by mouth 2 (two) times daily. 02/04/17 02/04/18  Irean HongSung, Jade J, MD  gabapentin (NEURONTIN) 800 MG tablet Take 1 tablet (800 mg total) by mouth 2 (two) times daily. 03/26/17 03/26/18  Rebecka ApleyWebster, Allison P, MD  levothyroxine (SYNTHROID) 150 MCG tablet Take 1 tablet (150 mcg total) by mouth daily. 12/06/15 12/05/16  Jennye MoccasinQuigley, Brian S, MD  levothyroxine (SYNTHROID) 150 MCG tablet Take 1 tablet (150 mcg total) by mouth daily. 09/21/16 09/21/17  Menshew, Charlesetta IvoryJenise V Bacon, PA-C  levothyroxine (SYNTHROID) 150 MCG tablet Take 1 tablet (150 mcg total) by mouth daily. 03/26/17 03/26/18  Rebecka ApleyWebster, Allison P, MD  ondansetron (ZOFRAN ODT) 4 MG disintegrating tablet Take 1 tablet (4 mg total) by mouth every 8 (eight) hours as needed for nausea or vomiting. 06/06/17   Irean HongSung, Jade J, MD  oxyCODONE-acetaminophen (ROXICET)  5-325 MG tablet Take 1-2 tablets by mouth every 4 (four) hours as needed for severe pain. 07/17/16   Beers, Charmayne Sheer, PA-C  PARoxetine (PAXIL) 40 MG tablet Take 1 tablet (40 mg total) by mouth daily. 02/04/17 02/04/18  Irean Hong, MD  PARoxetine (PAXIL) 40 MG tablet Take 1 tablet (40 mg total) by mouth daily. 03/26/17 03/26/18  Rebecka Apley, MD  predniSONE (DELTASONE) 20 MG tablet 3 tablets daily x 4 days 02/04/17    Irean Hong, MD  predniSONE (DELTASONE) 20 MG tablet Take 3 tablets (60 mg total) by mouth daily. 03/26/17   Rebecka Apley, MD    Allergies Penicillins and Sulfa antibiotics  No family history on file.  Social History Social History  Substance Use Topics  . Smoking status: Current Every Day Smoker    Packs/day: 1.00    Types: Cigarettes  . Smokeless tobacco: Current User  . Alcohol use No    Review of Systems  Constitutional: No fever/chills. Eyes: No visual changes. ENT: No sore throat. Cardiovascular: Denies chest pain. Respiratory: Denies shortness of breath. Gastrointestinal: Positive for abdominal pain and nausea, no vomiting.  No diarrhea.  No constipation. Genitourinary: Negative for dysuria. Musculoskeletal: Negative for back pain. Skin: Negative for rash. Neurological: Negative for headaches, focal weakness or numbness.   ____________________________________________   PHYSICAL EXAM:  VITAL SIGNS: ED Triage Vitals  Enc Vitals Group     BP 06/05/17 2312 118/61     Pulse Rate 06/05/17 2312 71     Resp 06/05/17 2312 16     Temp 06/05/17 2312 99 F (37.2 C)     Temp Source 06/05/17 2312 Oral     SpO2 06/05/17 2312 99 %     Weight 06/05/17 2304 160 lb (72.6 kg)     Height 06/05/17 2304 5\' 4"  (1.626 m)     Head Circumference --      Peak Flow --      Pain Score 06/05/17 2304 8     Pain Loc --      Pain Edu? --      Excl. in GC? --     Constitutional: Alert and oriented. Well appearing and in no acute distress. Eyes: Conjunctivae are normal. PERRL. EOMI. Head: Atraumatic. Nose: No congestion/rhinnorhea. Mouth/Throat: Mucous membranes are moist.  Oropharynx non-erythematous. Neck: No stridor.   Cardiovascular: Normal rate, regular rhythm. Grossly normal heart sounds.  Good peripheral circulation. Respiratory: Normal respiratory effort.  No retractions. Lungs CTAB. Gastrointestinal: Soft and mildly tender to palpation diffusely without rebound or  guarding. No distention. No abdominal bruits. No CVA tenderness. Musculoskeletal: No lower extremity tenderness nor edema.  No joint effusions. Neurologic:  Normal speech and language. No gross focal neurologic deficits are appreciated. No gait instability. Skin:  Skin is warm, dry and intact. No rash noted. Psychiatric: Mood and affect are normal. Speech and behavior are normal.  ____________________________________________   LABS (all labs ordered are listed, but only abnormal results are displayed)  Labs Reviewed  COMPREHENSIVE METABOLIC PANEL - Abnormal; Notable for the following:       Result Value   ALT 10 (*)    All other components within normal limits  CBC - Abnormal; Notable for the following:    RDW 15.2 (*)    All other components within normal limits  URINALYSIS, COMPLETE (UACMP) WITH MICROSCOPIC - Abnormal; Notable for the following:    Color, Urine STRAW (*)    APPearance CLEAR (*)  Specific Gravity, Urine 1.003 (*)    All other components within normal limits  LIPASE, BLOOD  TROPONIN I  ETHANOL  TROPONIN I   ____________________________________________  EKG  ED ECG REPORT I, SUNG,JADE J, the attending physician, personally viewed and interpreted this ECG.   Date: 06/06/2017  EKG Time: 2310  Rate: 73  Rhythm: normal EKG, normal sinus rhythm  Axis: Normal  Intervals:none  ST&T Change: Nonspecific  ____________________________________________  RADIOLOGY  Pending ____________________________________________   PROCEDURES  Procedure(s) performed: None  Procedures  Critical Care performed: No  ____________________________________________   INITIAL IMPRESSION / ASSESSMENT AND PLAN / ED COURSE  Pertinent labs & imaging results that were available during my care of the patient were reviewed by me and considered in my medical decision making (see chart for details).  61 year old female who presents with generalized abdominal pain associated  with nausea. Laboratory and urinalysis results unremarkable. Patient with generalized abdominal tenderness on exam. Will administer analgesia and proceeded to CT abdomen/pelvis to evaluate for intra-abdominal etiology.  Clinical Course as of Jun 06 706  Tue Jun 06, 2017  0707 Patient drinking contrast in preparation for CT scan. Care transferred to Dr. Langston Masker; disposition pending results of CT scan.  [JS]    Clinical Course User Index [JS] Irean Hong, MD     ____________________________________________   FINAL CLINICAL IMPRESSION(S) / ED DIAGNOSES  Final diagnoses:  Generalized abdominal pain  Nausea      NEW MEDICATIONS STARTED DURING THIS VISIT:  New Prescriptions   ONDANSETRON (ZOFRAN ODT) 4 MG DISINTEGRATING TABLET    Take 1 tablet (4 mg total) by mouth every 8 (eight) hours as needed for nausea or vomiting.     Note:  This document was prepared using Dragon voice recognition software and may include unintentional dictation errors.    Irean Hong, MD 06/06/17 [phone]

## 2017-06-06 NOTE — ED Notes (Signed)
Pt sitting in waiting room, no change in symptoms 

## 2017-07-27 ENCOUNTER — Emergency Department
Admission: EM | Admit: 2017-07-27 | Discharge: 2017-07-27 | Disposition: A | Discharge status: Home / Self Care | Payer: Medicaid Other | Attending: Emergency Medicine | Admitting: Emergency Medicine

## 2017-07-27 ENCOUNTER — Encounter: Payer: Self-pay | Admitting: Emergency Medicine

## 2017-07-27 ENCOUNTER — Emergency Department: Payer: Medicaid Other

## 2017-07-27 DIAGNOSIS — Y929 Unspecified place or not applicable: Secondary | ICD-10-CM | POA: Insufficient documentation

## 2017-07-27 DIAGNOSIS — Y999 Unspecified external cause status: Secondary | ICD-10-CM | POA: Diagnosis not present

## 2017-07-27 DIAGNOSIS — J449 Chronic obstructive pulmonary disease, unspecified: Secondary | ICD-10-CM | POA: Diagnosis not present

## 2017-07-27 DIAGNOSIS — F17210 Nicotine dependence, cigarettes, uncomplicated: Secondary | ICD-10-CM | POA: Diagnosis not present

## 2017-07-27 DIAGNOSIS — W19XXXA Unspecified fall, initial encounter: Secondary | ICD-10-CM | POA: Diagnosis not present

## 2017-07-27 DIAGNOSIS — S60211A Contusion of right wrist, initial encounter: Secondary | ICD-10-CM | POA: Diagnosis not present

## 2017-07-27 DIAGNOSIS — Y92009 Unspecified place in unspecified non-institutional (private) residence as the place of occurrence of the external cause: Secondary | ICD-10-CM

## 2017-07-27 DIAGNOSIS — S60221A Contusion of right hand, initial encounter: Secondary | ICD-10-CM | POA: Insufficient documentation

## 2017-07-27 DIAGNOSIS — Z79899 Other long term (current) drug therapy: Secondary | ICD-10-CM | POA: Insufficient documentation

## 2017-07-27 DIAGNOSIS — M25531 Pain in right wrist: Secondary | ICD-10-CM | POA: Diagnosis present

## 2017-07-27 DIAGNOSIS — Y939 Activity, unspecified: Secondary | ICD-10-CM | POA: Insufficient documentation

## 2017-07-27 MED ORDER — NAPROXEN 500 MG PO TABS
500.0000 mg | ORAL_TABLET | Freq: Once | ORAL | Status: AC
  Administered 2017-07-27: 500 mg via ORAL
  Filled 2017-07-27: qty 1

## 2017-07-27 MED ORDER — PAROXETINE HCL 40 MG PO TABS: 40.0000 mg | ORAL_TABLET | Freq: Every day | ORAL | 0 refills | Status: DC

## 2017-07-27 MED ORDER — NAPROXEN 500 MG PO TABS: 500.0000 mg | ORAL_TABLET | Freq: Two times a day (BID) | ORAL | 0 refills | Status: DC

## 2017-07-27 MED ORDER — LEVOTHYROXINE SODIUM 150 MCG PO TABS: 150.0000 ug | ORAL_TABLET | Freq: Every day | ORAL | 0 refills | Status: DC

## 2017-07-27 NOTE — ED Provider Notes (Signed)
[facility] Emergency Department Provider Note  ____________________________________________   First MD Initiated Contact with Patient 07/27/17 1130     (approximate)  I have reviewed the triage vital signs and the nursing notes.   HISTORY  Chief Complaint Fall and Wrist Pain    HPI Caroline Coleman is a 61 y.o. female the following day by EMS with complaint of right hand and wrist pain. Patient states that she fell last evening and this morning was experiencing increased pain.Patient states she is not taking any over-the-counter medication for her pain. She denies any head injury or loss of consciousness. She denies any previous injury to her hand or wrist. She rates her pain as a 6 out of 10.   Past Medical History:  Diagnosis Date  . Anxiety   . COPD (chronic obstructive pulmonary disease) (HCC)   . Depression   . Thyroid disease     Patient Active Problem List   Diagnosis Date Noted  . Self-inflicted laceration of wrist 08/03/2016  . Substance induced mood disorder (HCC) 08/03/2016  . Alcohol abuse 08/03/2016  . Panic disorder with agoraphobia 12/14/2015  . Insomnia 12/14/2015  . Depression 12/14/2015    Past Surgical History:  Procedure Laterality Date  . EYE SURGERY    . HIP SURGERY      Prior to Admission medications   Medication Sig Start Date End Date Taking? Authorizing Provider  albuterol (PROVENTIL HFA;VENTOLIN HFA) 108 (90 Base) MCG/ACT inhaler Inhale 2 puffs into the lungs every 6 (six) hours as needed for wheezing or shortness of breath. 03/26/17   Rebecka Apley, MD  albuterol (PROVENTIL) (2.5 MG/3ML) 0.083% nebulizer solution Take 3 mLs (2.5 mg total) by nebulization every 4 (four) hours as needed for wheezing or shortness of breath. 02/04/17   Irean Hong, MD  ALPRAZolam Prudy Feeler) 0.25 MG tablet Take 1 tablet by mouth at bedtime. 08/13/15   [provider]  gabapentin (NEURONTIN) 400 MG capsule Take 1 capsule  (400 mg total) by mouth 2 (two) times daily. 06/06/17 06/06/18  Myrna Blazer, MD  levothyroxine (SYNTHROID) 150 MCG tablet Take 1 tablet (150 mcg total) by mouth daily. 07/27/17 07/27/18  Tommi Rumps, PA-C  metoCLOPramide (REGLAN) 10 MG tablet Take 1 tablet (10 mg total) by mouth every 6 (six) hours as needed. 06/06/17   Schaevitz, Myra Rude, MD  naproxen (NAPROSYN) 500 MG tablet Take 1 tablet (500 mg total) by mouth 2 (two) times daily with a meal. 07/27/17   Tommi Rumps, PA-C  PARoxetine (PAXIL) 40 MG tablet Take 1 tablet (40 mg total) by mouth daily. 07/27/17 07/27/18  Tommi Rumps, PA-C    Allergies Penicillins and Sulfa antibiotics  No family history on file.  Social History Social History  Substance Use Topics  . Smoking status: Current Every Day Smoker    Packs/day: 1.00    Types: Cigarettes  . Smokeless tobacco: Current User  . Alcohol use No    Review of Systems Constitutional: No fever/chills Eyes: No visual changes. ENT: No trauma Cardiovascular: Denies chest pain. Respiratory: Denies shortness of breath. Gastrointestinal:  No nausea, no vomiting.  Musculoskeletal: Positive for right wrist and hand pain. Skin: Negative for rash. Neurological: Negative for focal weakness or numbness.   ____________________________________________   PHYSICAL EXAM:  VITAL SIGNS: ED Triage Vitals  Enc Vitals Group     BP      Pulse      Resp  Temp      Temp src      SpO2      Weight      Height      Head Circumference      Peak Flow      Pain Score      Pain Loc      Pain Edu?      Excl. in GC?    Constitutional: Alert and oriented. Well appearing and in no acute distress. Eyes: Conjunctivae are normal. PERRL. EOMI. Head: Atraumatic. Neck: No stridor.   Cardiovascular: Normal rate, regular rhythm. Grossly normal heart sounds.  Good peripheral circulation. Respiratory: Normal respiratory effort.  No retractions. Lungs  CTAB. Gastrointestinal: Soft and nontender. No distention.  Musculoskeletal: Examination of the right hand and wrist there is moderate soft tissue swelling present. Range of motion is guarded secondary to pain. Moderate soft tissue swelling is noted on the dorsal aspect of the right hand extending into the fingers. Motor sensory function intact. Capillary refill is less than 3 seconds. No gross deformity is noted. Neurologic:  Normal speech and language. No gross focal neurologic deficits are appreciated.  Skin:  Skin is warm, dry and intact. No abrasions, ecchymosis or erythema noted. Psychiatric: Mood and affect are normal. Speech and behavior are normal.  ____________________________________________   LABS (all labs ordered are listed, but only abnormal results are displayed)  Labs Reviewed - No data to display  RADIOLOGY  Dg Wrist Complete Right  Result Date: 07/27/2017 CLINICAL DATA:  Right wrist pain and swelling following a fall last night. EXAM: RIGHT WRIST - COMPLETE 3+ VIEW COMPARISON:  Right hand radiographs obtained at the same time. FINDINGS: Diffuse dorsal soft tissue swelling. There is irregularity of the dorsal aspect of the distal radius with an appearance suggesting an old, healed fracture or bony over growth. This does not have the appearance of an acute fracture. Mild first MCP joint degenerative changes are noted. IMPRESSION: No acute fracture. There is some irregularity of the dorsal aspect of the distal radius without the typical appearance of an acute fracture. Electronically Signed   By: Beckie Salts M.D.   On: 07/27/2017 11:54   Dg Hand Complete Right  Result Date: 07/27/2017 CLINICAL DATA:  Right wrist and hand pain and swelling after falling last night. EXAM: RIGHT HAND - COMPLETE 3+ VIEW COMPARISON:  Right wrist radiographs obtained at the same time. FINDINGS: Diffuse dorsal soft tissue swelling. No fracture or dislocation seen. IMPRESSION: No fracture.  Electronically Signed   By: Beckie Salts M.D.   On: 07/27/2017 11:52    ____________________________________________   PROCEDURES  Procedure(s) performed: None  Procedures  Critical Care performed: No  ____________________________________________   INITIAL IMPRESSION / ASSESSMENT AND PLAN / ED COURSE  Pertinent labs & imaging results that were available during my care of the patient were reviewed by me and considered in my medical decision making (see chart for details).  Patient is placed in a Jones wrap to support her hand and wrist. She was instructed to ice and elevate her hand frequently to reduce the swelling. Patient also states that currently she is out of medication and gives the same story that she gave when she was here last month. Currently she is trying to get her Medicaid and began seeing Dr. Lacie Scotts once again. Patient was given one month of Synthroid, Paxil and is also given a prescription for naproxen for her hand and wrist pain. She was instructed to follow-up with the Medicaid  office to see if her card has been changed.   ____________________________________________   FINAL CLINICAL IMPRESSION(S) / ED DIAGNOSES  Final diagnoses:  Contusion of right hand, initial encounter  Contusion of right wrist, initial encounter  Fall in home, initial encounter      NEW MEDICATIONS STARTED DURING THIS VISIT:  Discharge Medication List as of 07/27/2017 12:46 PM    START taking these medications   Details  naproxen (NAPROSYN) 500 MG tablet Take 1 tablet (500 mg total) by mouth 2 (two) times daily with a meal., Starting Thu 07/27/2017, Print         Note:  This document was prepared using Dragon voice recognition software and may include unintentional dictation errors.    Tommi RumpsSummers, Rhonda L, PA-C 07/27/17 1649    Arnaldo NatalMalinda, Paul F, MD 07/28/17 [phone]

## 2017-07-27 NOTE — Discharge Instructions (Signed)
Call make an appointment with your primary care doctor at [facility]. There is a prescription for Paxil and your thyroid medicine for 30 days. Ice and elevate your hand and wrist. There were no fractures seen on your x-rays today. Call person working on your Medicaid papers so that you can see your primary care doctor before you  run out of medication.

## 2017-07-27 NOTE — ED Triage Notes (Signed)
Brought in via ems s/p fall last pm  Having pain to right wrist  Swelling noted to wrist and hand area

## 2018-08-23 ENCOUNTER — Inpatient Hospital Stay
Admission: EM | Admit: 2018-08-23 | Discharge: 2018-08-26 | DRG: 193 | Disposition: A | Discharge status: Home / Self Care | Payer: Medicaid Other | Attending: Internal Medicine | Admitting: Internal Medicine

## 2018-08-23 ENCOUNTER — Emergency Department: Payer: Medicaid Other

## 2018-08-23 ENCOUNTER — Other Ambulatory Visit: Payer: Self-pay

## 2018-08-23 ENCOUNTER — Encounter: Payer: Self-pay | Admitting: Emergency Medicine

## 2018-08-23 DIAGNOSIS — R0902 Hypoxemia: Secondary | ICD-10-CM

## 2018-08-23 DIAGNOSIS — F419 Anxiety disorder, unspecified: Secondary | ICD-10-CM | POA: Diagnosis present

## 2018-08-23 DIAGNOSIS — J9600 Acute respiratory failure, unspecified whether with hypoxia or hypercapnia: Secondary | ICD-10-CM | POA: Diagnosis present

## 2018-08-23 DIAGNOSIS — J441 Chronic obstructive pulmonary disease with (acute) exacerbation: Secondary | ICD-10-CM | POA: Diagnosis present

## 2018-08-23 DIAGNOSIS — E079 Disorder of thyroid, unspecified: Secondary | ICD-10-CM | POA: Diagnosis present

## 2018-08-23 DIAGNOSIS — J440 Chronic obstructive pulmonary disease with acute lower respiratory infection: Secondary | ICD-10-CM | POA: Diagnosis present

## 2018-08-23 DIAGNOSIS — Z79899 Other long term (current) drug therapy: Secondary | ICD-10-CM

## 2018-08-23 DIAGNOSIS — J9601 Acute respiratory failure with hypoxia: Secondary | ICD-10-CM | POA: Diagnosis present

## 2018-08-23 DIAGNOSIS — J189 Pneumonia, unspecified organism: Principal | ICD-10-CM | POA: Diagnosis present

## 2018-08-23 DIAGNOSIS — F329 Major depressive disorder, single episode, unspecified: Secondary | ICD-10-CM | POA: Diagnosis present

## 2018-08-23 DIAGNOSIS — F17210 Nicotine dependence, cigarettes, uncomplicated: Secondary | ICD-10-CM | POA: Diagnosis present

## 2018-08-23 LAB — URINALYSIS, ROUTINE W REFLEX MICROSCOPIC
Bacteria, UA: NONE SEEN
Bilirubin Urine: NEGATIVE
GLUCOSE, UA: NEGATIVE mg/dL
Ketones, ur: 5 mg/dL — AB
Leukocytes, UA: NEGATIVE
Nitrite: NEGATIVE
PH: 6 (ref 5.0–8.0)
PROTEIN: NEGATIVE mg/dL
SQUAMOUS EPITHELIAL / LPF: NONE SEEN (ref 0–5)
Specific Gravity, Urine: 1.003 — ABNORMAL LOW (ref 1.005–1.030)

## 2018-08-23 LAB — COMPREHENSIVE METABOLIC PANEL
ALK PHOS: 83 U/L (ref 38–126)
ALT: 10 U/L (ref 0–44)
AST: 19 U/L (ref 15–41)
Albumin: 4 g/dL (ref 3.5–5.0)
Anion gap: 9 (ref 5–15)
BILIRUBIN TOTAL: 0.5 mg/dL (ref 0.3–1.2)
BUN: 10 mg/dL (ref 8–23)
CALCIUM: 9.3 mg/dL (ref 8.9–10.3)
CO2: 30 mmol/L (ref 22–32)
CREATININE: 0.9 mg/dL (ref 0.44–1.00)
Chloride: 102 mmol/L (ref 98–111)
GFR calc non Af Amer: >60 mL/min (ref >60)
GLUCOSE: 137 mg/dL — AB (ref 70–99)
Potassium: 4.1 mmol/L (ref 3.5–5.1)
SODIUM: 141 mmol/L (ref 135–145)
Total Protein: 7.8 g/dL (ref 6.5–8.1)

## 2018-08-23 LAB — CBC WITH DIFFERENTIAL/PLATELET
Basophils Absolute: 0.1 10*3/uL (ref 0–0.1)
Basophils Relative: 1 %
EOS PCT: 2 %
Eosinophils Absolute: 0.2 10*3/uL (ref 0–0.7)
HCT: 39.1 % (ref 35.0–47.0)
Hemoglobin: 13.2 g/dL (ref 12.0–16.0)
LYMPHS PCT: 10 %
Lymphs Abs: 1.1 10*3/uL (ref 1.0–3.6)
MCH: 29.7 pg (ref 26.0–34.0)
MCHC: 33.8 g/dL (ref 32.0–36.0)
MCV: 87.7 fL (ref 80.0–100.0)
MONO ABS: 0.8 10*3/uL (ref 0.2–0.9)
Monocytes Relative: 7 %
Neutro Abs: 9.8 10*3/uL — ABNORMAL HIGH (ref 1.4–6.5)
Neutrophils Relative %: 82 %
PLATELETS: 232 10*3/uL (ref 150–440)
RBC: 4.46 MIL/uL (ref 3.80–5.20)
RDW: 14 % (ref 11.5–14.5)
WBC: 12 10*3/uL — ABNORMAL HIGH (ref 3.6–11.0)

## 2018-08-23 LAB — BRAIN NATRIURETIC PEPTIDE: B Natriuretic Peptide: 43 pg/mL (ref 0.0–100.0)

## 2018-08-23 LAB — TROPONIN I: Troponin I: <0.03 ng/mL (ref <0.03)

## 2018-08-23 LAB — LACTIC ACID, PLASMA: Lactic Acid, Venous: 1.4 mmol/L (ref 0.5–1.9)

## 2018-08-23 MED ORDER — SODIUM CHLORIDE 0.9 % IV BOLUS
1000.0000 mL | Freq: Once | INTRAVENOUS | Status: AC
  Administered 2018-08-23: 1000 mL via INTRAVENOUS

## 2018-08-23 MED ORDER — IPRATROPIUM-ALBUTEROL 0.5-2.5 (3) MG/3ML IN SOLN
3.0000 mL | Freq: Once | RESPIRATORY_TRACT | Status: AC
  Administered 2018-08-23: 3 mL via RESPIRATORY_TRACT
  Filled 2018-08-23: qty 3

## 2018-08-23 MED ORDER — SODIUM CHLORIDE 0.9 % IV SOLN
1.0000 g | Freq: Once | INTRAVENOUS | Status: AC
  Administered 2018-08-23: 1 g via INTRAVENOUS
  Filled 2018-08-23: qty 10

## 2018-08-23 MED ORDER — SODIUM CHLORIDE 0.9 % IV SOLN
500.0000 mg | Freq: Once | INTRAVENOUS | Status: AC
  Administered 2018-08-23: 500 mg via INTRAVENOUS
  Filled 2018-08-23: qty 500

## 2018-08-23 MED ORDER — METHYLPREDNISOLONE SODIUM SUCC 125 MG IJ SOLR
125.0000 mg | Freq: Once | INTRAMUSCULAR | Status: AC
  Administered 2018-08-23: 125 mg via INTRAVENOUS
  Filled 2018-08-23: qty 2

## 2018-08-23 MED ORDER — ACETAMINOPHEN 325 MG PO TABS
650.0000 mg | ORAL_TABLET | Freq: Once | ORAL | Status: AC
  Administered 2018-08-23: 650 mg via ORAL
  Filled 2018-08-23: qty 2

## 2018-08-23 NOTE — ED Triage Notes (Signed)
Pt presents to ED with SOB. Pt reports having productive cough, nasal congestion, and chest tightness for the past week. Has not checked for fever. Increased work of breathing noted at this time.

## 2018-08-23 NOTE — ED Provider Notes (Signed)
[facility] Emergency Department Provider Note ____________________________________________   First MD Initiated Contact with Patient 08/23/18 1948     (approximate)  I have reviewed the triage vital signs and the nursing notes.   HISTORY  Chief Complaint Shortness of Breath and Cough    HPI Caroline Coleman is a 62 y.o. female with PMH as noted below including COPD (not on home O2) who presents with shortness of breath, gradual onset, persistent course, and occurring over the last week.  She reports associated subjective fever and chills, as well as cough.  She denies chest pain, vomiting, or urinary symptoms.   Past Medical History:  Diagnosis Date  . Anxiety   . COPD (chronic obstructive pulmonary disease) (HCC)   . Depression   . Thyroid disease     Patient Active Problem List   Diagnosis Date Noted  . Self-inflicted laceration of wrist 08/03/2016  . Substance induced mood disorder (HCC) 08/03/2016  . Alcohol abuse 08/03/2016  . Panic disorder with agoraphobia 12/14/2015  . Insomnia 12/14/2015  . Depression 12/14/2015    Past Surgical History:  Procedure Laterality Date  . EYE SURGERY    . HIP SURGERY      Prior to Admission medications   Medication Sig Start Date End Date Taking? Authorizing Provider  albuterol (PROVENTIL HFA;VENTOLIN HFA) 108 (90 Base) MCG/ACT inhaler Inhale 2 puffs into the lungs every 6 (six) hours as needed for wheezing or shortness of breath. 03/26/17   Rebecka Apley, MD  albuterol (PROVENTIL) (2.5 MG/3ML) 0.083% nebulizer solution Take 3 mLs (2.5 mg total) by nebulization every 4 (four) hours as needed for wheezing or shortness of breath. 02/04/17   Irean Hong, MD  ALPRAZolam Prudy Feeler) 0.25 MG tablet Take 1 tablet by mouth at bedtime. 08/13/15   [provider]  gabapentin (NEURONTIN) 400 MG capsule Take 1 capsule (400 mg total) by mouth 2 (two) times daily. 06/06/17 06/06/18  Myrna Blazer,  MD  levothyroxine (SYNTHROID) 150 MCG tablet Take 1 tablet (150 mcg total) by mouth daily. 07/27/17 07/27/18  Tommi Rumps, PA-C  metoCLOPramide (REGLAN) 10 MG tablet Take 1 tablet (10 mg total) by mouth every 6 (six) hours as needed. 06/06/17   Schaevitz, Myra Rude, MD  naproxen (NAPROSYN) 500 MG tablet Take 1 tablet (500 mg total) by mouth 2 (two) times daily with a meal. 07/27/17   Tommi Rumps, PA-C  PARoxetine (PAXIL) 40 MG tablet Take 1 tablet (40 mg total) by mouth daily. 07/27/17 07/27/18  Tommi Rumps, PA-C    Allergies Penicillins and Sulfa antibiotics  No family history on file.  Social History Social History   Tobacco Use  . Smoking status: Current Every Day Smoker    Packs/day: 1.00    Types: Cigarettes  . Smokeless tobacco: Current User  Substance Use Topics  . Alcohol use: No  . Drug use: No    Review of Systems  Constitutional: Positive for fever. Eyes: No redness. ENT: No sore throat. Cardiovascular: Denies chest pain. Respiratory: Positive for shortness of breath. Gastrointestinal: No vomiting.  Genitourinary: Negative for dysuria.  Musculoskeletal: Negative for back pain. Skin: Negative for rash. Neurological: Negative for headache.   ____________________________________________   PHYSICAL EXAM:  VITAL SIGNS: ED Triage Vitals  Enc Vitals Group     BP 08/23/18 1939 137/64     Pulse Rate 08/23/18 1939 (!) 107     Resp 08/23/18 1939 (!) 28     Temp 08/23/18  1939 (!) 102.1 F (38.9 C)     Temp Source 08/23/18 1939 Oral     SpO2 08/23/18 1939 (!) 89 %     Weight 08/23/18 1940 175 lb (79.4 kg)     Height 08/23/18 1940 5\' 4"  (1.626 m)     Head Circumference --      Peak Flow --      Pain Score 08/23/18 1940 3     Pain Loc --      Pain Edu? --      Excl. in GC? --     Constitutional: Alert and oriented.  Slightly uncomfortable appearing but no acute distress. Eyes: Conjunctivae are normal.  Head: Atraumatic. Nose: No  congestion/rhinnorhea. Mouth/Throat: Mucous membranes are somewhat dry.   Neck: Normal range of motion.  Cardiovascular: Tachycardic, regular rhythm. Grossly normal heart sounds.  Good peripheral circulation. Respiratory: Normal respiratory effort.  No retractions.  Decreased breath sounds bilaterally with faint wheezing. Gastrointestinal: Soft and nontender. No distention.  Genitourinary: No flank tenderness. Musculoskeletal: No lower extremity edema.  Extremities warm and well perfused.  Neurologic:  Normal speech and language. No gross focal neurologic deficits are appreciated.  Skin:  Skin is warm and dry. No rash noted. Psychiatric: Mood and affect are normal. Speech and behavior are normal.  ____________________________________________   LABS (all labs ordered are listed, but only abnormal results are displayed)  Labs Reviewed  COMPREHENSIVE METABOLIC PANEL - Abnormal; Notable for the following components:      Result Value   Glucose, Bld 137 (*)    All other components within normal limits  CBC WITH DIFFERENTIAL/PLATELET - Abnormal; Notable for the following components:   WBC 12.0 (*)    Neutro Abs 9.8 (*)    All other components within normal limits  URINALYSIS, ROUTINE W REFLEX MICROSCOPIC - Abnormal; Notable for the following components:   Color, Urine YELLOW (*)    APPearance CLEAR (*)    Specific Gravity, Urine 1.003 (*)    Hgb urine dipstick SMALL (*)    Ketones, ur 5 (*)    All other components within normal limits  CULTURE, BLOOD (ROUTINE X 2)  CULTURE, BLOOD (ROUTINE X 2)  LACTIC ACID, PLASMA  BRAIN NATRIURETIC PEPTIDE  TROPONIN I  LACTIC ACID, PLASMA   ____________________________________________  EKG  ED ECG REPORT I, Dionne Bucy, the attending physician, personally viewed and interpreted this ECG.  Date: 08/23/2018 EKG Time: 1937 Rate: 106 Rhythm: Sinus tachycardia QRS Axis: normal Intervals: normal ST/T Wave abnormalities:  normal Narrative Interpretation: no evidence of acute ischemia  ____________________________________________  RADIOLOGY  CXR: Bilateral lower lobe infiltrate concerning for pneumonia  ____________________________________________   PROCEDURES  Procedure(s) performed: No  Procedures  Critical Care performed: No ____________________________________________   INITIAL IMPRESSION / ASSESSMENT AND PLAN / ED COURSE  Pertinent labs & imaging results that were available during my care of the patient were reviewed by me and considered in my medical decision making (see chart for details).  62 year old female with PMH as noted above presents with 1 week of shortness of breath, cough, weakness, and fever.  I reviewed the past medical records in Epic; patient had several ED visits last year for unrelated symptoms, but no recent admissions.  On exam, the patient is slightly uncomfortable appearing but in no acute distress.  O2 saturation is in the high 80s, and she is febrile and tachycardic.  She has mainly decreased breath sounds bilaterally.  Overall presentation is most consistent with acute bronchitis, pneumonia, COPD  exacerbation, or less likely other source of infection.  We will obtain infection/sepsis work-up, give nebs and steroid, fluids, and reassess.  ----------------------------------------- 11:10 PM on 08/23/2018 -----------------------------------------  X-ray consistent with bilateral pneumonia.  The patient's O2 saturation remains in the high 80s on room air (occasionally going up into the low 90s for short time) and given the bilateral infiltrates and the hypoxia I feel she would be safer to be admitted.  The patient initially hesitated and wanted to go home but now agrees to admission.  I signed the patient out to the hospitalist Dr. Caryn Bee. ____________________________________________   FINAL CLINICAL IMPRESSION(S) / ED DIAGNOSES  Final diagnoses:  Community  acquired pneumonia, unspecified laterality  Hypoxia      NEW MEDICATIONS STARTED DURING THIS VISIT:  New Prescriptions   No medications on file     Note:  This document was prepared using Dragon voice recognition software and may include unintentional dictation errors.    Dionne Bucy, MD 08/23/18 2311

## 2018-08-24 ENCOUNTER — Other Ambulatory Visit: Payer: Self-pay

## 2018-08-24 DIAGNOSIS — F419 Anxiety disorder, unspecified: Secondary | ICD-10-CM | POA: Diagnosis present

## 2018-08-24 DIAGNOSIS — R0902 Hypoxemia: Secondary | ICD-10-CM | POA: Diagnosis present

## 2018-08-24 DIAGNOSIS — J440 Chronic obstructive pulmonary disease with acute lower respiratory infection: Secondary | ICD-10-CM | POA: Diagnosis present

## 2018-08-24 DIAGNOSIS — F329 Major depressive disorder, single episode, unspecified: Secondary | ICD-10-CM | POA: Diagnosis present

## 2018-08-24 DIAGNOSIS — E079 Disorder of thyroid, unspecified: Secondary | ICD-10-CM | POA: Diagnosis present

## 2018-08-24 DIAGNOSIS — J189 Pneumonia, unspecified organism: Secondary | ICD-10-CM | POA: Diagnosis present

## 2018-08-24 DIAGNOSIS — J9600 Acute respiratory failure, unspecified whether with hypoxia or hypercapnia: Secondary | ICD-10-CM | POA: Diagnosis present

## 2018-08-24 DIAGNOSIS — J441 Chronic obstructive pulmonary disease with (acute) exacerbation: Secondary | ICD-10-CM | POA: Diagnosis present

## 2018-08-24 DIAGNOSIS — F17210 Nicotine dependence, cigarettes, uncomplicated: Secondary | ICD-10-CM | POA: Diagnosis present

## 2018-08-24 DIAGNOSIS — Z79899 Other long term (current) drug therapy: Secondary | ICD-10-CM | POA: Diagnosis not present

## 2018-08-24 DIAGNOSIS — J9601 Acute respiratory failure with hypoxia: Secondary | ICD-10-CM | POA: Diagnosis present

## 2018-08-24 LAB — CBC
HCT: 35.4 % (ref 35.0–47.0)
HEMOGLOBIN: 12.3 g/dL (ref 12.0–16.0)
MCH: 30.4 pg (ref 26.0–34.0)
MCHC: 34.6 g/dL (ref 32.0–36.0)
MCV: 87.7 fL (ref 80.0–100.0)
Platelets: 203 10*3/uL (ref 150–440)
RBC: 4.04 MIL/uL (ref 3.80–5.20)
RDW: 14.1 % (ref 11.5–14.5)
WBC: 9.4 10*3/uL (ref 3.6–11.0)

## 2018-08-24 LAB — BASIC METABOLIC PANEL
ANION GAP: 8 (ref 5–15)
BUN: 9 mg/dL (ref 8–23)
CHLORIDE: 106 mmol/L (ref 98–111)
CO2: 26 mmol/L (ref 22–32)
Calcium: 8.7 mg/dL — ABNORMAL LOW (ref 8.9–10.3)
Creatinine, Ser: 0.78 mg/dL (ref 0.44–1.00)
GFR calc Af Amer: >60 mL/min (ref >60)
Glucose, Bld: 275 mg/dL — ABNORMAL HIGH (ref 70–99)
POTASSIUM: 3.6 mmol/L (ref 3.5–5.1)
SODIUM: 140 mmol/L (ref 135–145)

## 2018-08-24 LAB — GLUCOSE, CAPILLARY: GLUCOSE-CAPILLARY: 154 mg/dL — AB (ref 70–99)

## 2018-08-24 LAB — LACTIC ACID, PLASMA: Lactic Acid, Venous: 1.8 mmol/L (ref 0.5–1.9)

## 2018-08-24 MED ORDER — LEVOTHYROXINE SODIUM 50 MCG PO TABS
150.0000 ug | ORAL_TABLET | Freq: Every day | ORAL | Status: DC
  Administered 2018-08-24 – 2018-08-26 (×3): 150 ug via ORAL
  Filled 2018-08-24 (×3): qty 1

## 2018-08-24 MED ORDER — HEPARIN SODIUM (PORCINE) 5000 UNIT/ML IJ SOLN
5000.0000 [IU] | Freq: Three times a day (TID) | INTRAMUSCULAR | Status: DC
  Administered 2018-08-24: 5000 [IU] via SUBCUTANEOUS
  Filled 2018-08-24 (×4): qty 1

## 2018-08-24 MED ORDER — DOCUSATE SODIUM 100 MG PO CAPS
100.0000 mg | ORAL_CAPSULE | Freq: Two times a day (BID) | ORAL | Status: DC
  Administered 2018-08-24 – 2018-08-26 (×5): 100 mg via ORAL
  Filled 2018-08-24 (×5): qty 1

## 2018-08-24 MED ORDER — ACETAMINOPHEN 650 MG RE SUPP: 650.0000 mg | Freq: Four times a day (QID) | RECTAL | Status: DC | PRN

## 2018-08-24 MED ORDER — SODIUM CHLORIDE 0.9 % IV SOLN
INTRAVENOUS | Status: DC
  Administered 2018-08-24 (×2): via INTRAVENOUS

## 2018-08-24 MED ORDER — AZITHROMYCIN 500 MG PO TABS
500.0000 mg | ORAL_TABLET | Freq: Every day | ORAL | Status: DC
  Administered 2018-08-24 – 2018-08-25 (×2): 500 mg via ORAL
  Filled 2018-08-24 (×2): qty 1

## 2018-08-24 MED ORDER — GUAIFENESIN 100 MG/5ML PO SOLN
5.0000 mL | ORAL | Status: DC | PRN
  Administered 2018-08-24 – 2018-08-25 (×3): 100 mg via ORAL
  Filled 2018-08-24 (×5): qty 5

## 2018-08-24 MED ORDER — SODIUM CHLORIDE 0.9 % IV SOLN
1.0000 g | INTRAVENOUS | Status: DC
  Filled 2018-08-24: qty 10

## 2018-08-24 MED ORDER — ALPRAZOLAM 0.25 MG PO TABS
0.2500 mg | ORAL_TABLET | Freq: Every day | ORAL | Status: DC
  Administered 2018-08-24 – 2018-08-25 (×2): 0.25 mg via ORAL
  Filled 2018-08-24 (×2): qty 1

## 2018-08-24 MED ORDER — TRAZODONE HCL 50 MG PO TABS: 25.0000 mg | ORAL_TABLET | Freq: Every evening | ORAL | Status: DC | PRN

## 2018-08-24 MED ORDER — GABAPENTIN 400 MG PO CAPS
400.0000 mg | ORAL_CAPSULE | Freq: Two times a day (BID) | ORAL | Status: DC
  Administered 2018-08-24 – 2018-08-26 (×6): 400 mg via ORAL
  Filled 2018-08-24 (×6): qty 1

## 2018-08-24 MED ORDER — ACETAMINOPHEN 325 MG PO TABS: 650.0000 mg | ORAL_TABLET | Freq: Four times a day (QID) | ORAL | Status: DC | PRN

## 2018-08-24 MED ORDER — HYDROCODONE-ACETAMINOPHEN 5-325 MG PO TABS
1.0000 | ORAL_TABLET | ORAL | Status: DC | PRN
  Administered 2018-08-24: 2 via ORAL
  Administered 2018-08-24: 1 via ORAL
  Administered 2018-08-25 (×2): 2 via ORAL
  Administered 2018-08-25: 1 via ORAL
  Administered 2018-08-26: 2 via ORAL
  Filled 2018-08-24 (×3): qty 2
  Filled 2018-08-24: qty 1
  Filled 2018-08-24: qty 2
  Filled 2018-08-24: qty 1

## 2018-08-24 MED ORDER — METHYLPREDNISOLONE SODIUM SUCC 40 MG IJ SOLR: 40.0000 mg | Freq: Two times a day (BID) | INTRAMUSCULAR | Status: DC

## 2018-08-24 MED ORDER — METHYLPREDNISOLONE SODIUM SUCC 125 MG IJ SOLR
80.0000 mg | Freq: Two times a day (BID) | INTRAMUSCULAR | Status: DC
  Administered 2018-08-24: 80 mg via INTRAVENOUS
  Filled 2018-08-24: qty 2

## 2018-08-24 MED ORDER — SODIUM CHLORIDE 0.9 % IV SOLN
500.0000 mg | INTRAVENOUS | Status: DC
  Filled 2018-08-24: qty 500

## 2018-08-24 MED ORDER — ONDANSETRON HCL 4 MG PO TABS: 4.0000 mg | ORAL_TABLET | Freq: Four times a day (QID) | ORAL | Status: DC | PRN

## 2018-08-24 MED ORDER — ONDANSETRON HCL 4 MG/2ML IJ SOLN: 4.0000 mg | Freq: Four times a day (QID) | INTRAMUSCULAR | Status: DC | PRN

## 2018-08-24 MED ORDER — NICOTINE 14 MG/24HR TD PT24
14.0000 mg | MEDICATED_PATCH | Freq: Every day | TRANSDERMAL | Status: DC
  Administered 2018-08-24 – 2018-08-25 (×2): 14 mg via TRANSDERMAL
  Filled 2018-08-24 (×3): qty 1

## 2018-08-24 MED ORDER — METHYLPREDNISOLONE SODIUM SUCC 40 MG IJ SOLR
40.0000 mg | Freq: Two times a day (BID) | INTRAMUSCULAR | Status: DC
  Administered 2018-08-24: 40 mg via INTRAVENOUS
  Filled 2018-08-24: qty 1

## 2018-08-24 MED ORDER — PAROXETINE HCL 20 MG PO TABS
40.0000 mg | ORAL_TABLET | Freq: Every day | ORAL | Status: DC
  Administered 2018-08-24 – 2018-08-26 (×3): 40 mg via ORAL
  Filled 2018-08-24 (×3): qty 2

## 2018-08-24 MED ORDER — IPRATROPIUM-ALBUTEROL 0.5-2.5 (3) MG/3ML IN SOLN
3.0000 mL | Freq: Four times a day (QID) | RESPIRATORY_TRACT | Status: DC
  Administered 2018-08-24 – 2018-08-26 (×7): 3 mL via RESPIRATORY_TRACT
  Filled 2018-08-24 (×7): qty 3

## 2018-08-24 MED ORDER — MIRTAZAPINE 15 MG PO TABS
30.0000 mg | ORAL_TABLET | Freq: Every day | ORAL | Status: DC
  Administered 2018-08-24 – 2018-08-25 (×2): 30 mg via ORAL
  Filled 2018-08-24 (×2): qty 2

## 2018-08-24 MED ORDER — BISACODYL 5 MG PO TBEC: 5.0000 mg | DELAYED_RELEASE_TABLET | Freq: Every day | ORAL | Status: DC | PRN

## 2018-08-24 MED ORDER — SODIUM CHLORIDE 0.9 % IV SOLN
1.0000 g | Freq: Every day | INTRAVENOUS | Status: DC
  Administered 2018-08-24 – 2018-08-25 (×2): 1 g via INTRAVENOUS
  Filled 2018-08-24: qty 1
  Filled 2018-08-24: qty 10
  Filled 2018-08-24: qty 1

## 2018-08-24 NOTE — Progress Notes (Signed)
Patient feels better.  She still has wheezing and cough but no shortness of breath.   Vital signs reviewed, labs reviewed. Physical examinations done.  Bilateral expiratory wheezing. A/P: 1.  Acute respiratory failure with hypoxia, secondary to pneumonia and acute COPD exacerbation.   Continue Zithromax and Rocephin, IV steroids, duo nebs.  Off oxygen.  Robitussin as needed. 2. Tobacco abuse.  Smoking cessation was counseled for 4 minutes, nicotine patch.  I discussed with the patient and RN. Time spent about 26 minutes.

## 2018-08-24 NOTE — Progress Notes (Addendum)
I was notified by Lab that pt's Lactic acid is 3.0 this morning, I have requested Lab to come and recollect sample due to result having last night's date 9/26- and time of 2008. A new order has been placed.

## 2018-08-24 NOTE — H&P (Signed)
[facility] - [place] at [facility]   PATIENT NAME: Caroline Coleman    MR#:  [id number]  DATE OF BIRTH:  1955/12/14  DATE OF ADMISSION:  08/23/2018  PRIMARY CARE PHYSICIAN: Evelene Croon, MD   REQUESTING/REFERRING PHYSICIAN:   CHIEF COMPLAINT:   Chief Complaint  Patient presents with  . Shortness of Breath  . Cough    HISTORY OF PRESENT ILLNESS: Caroline Coleman  is a 62 y.o. female with a known history of anxiety, COPD, depression disorder, thyroid disease. Patient presented to emergency room for moderate shortness of breath and productive cough going on for the past week, gradually getting worse, despite using nebulizer treatment.  She reports associated subjective fever and chills, at home.  Patient is a current everyday smoker, although she has not been able to smoke for the past 3 to 4 days, because she was not feeling good. At the arrival to emergency room, patient was noted with low oxygen saturation at 89% on room air. Blood test done emergency room reveals elevated WBC at 12,000.  BNP is 43.  Lactic acid is 1.4.  CMP is grossly unremarkable. Chest x-ray shows bilateral lower lobes pneumonia. Patient is admitted for further evaluation and treatment.  PAST MEDICAL HISTORY:   Past Medical History:  Diagnosis Date  . Anxiety   . COPD (chronic obstructive pulmonary disease) (HCC)   . Depression   . Thyroid disease     PAST SURGICAL HISTORY:  Past Surgical History:  Procedure Laterality Date  . EYE SURGERY    . HIP SURGERY      SOCIAL HISTORY:  Social History   Tobacco Use  . Smoking status: Current Every Day Smoker    Packs/day: 1.00    Types: Cigarettes  . Smokeless tobacco: Current User  Substance Use Topics  . Alcohol use: No    FAMILY HISTORY: No family history on file.  DRUG ALLERGIES: Penicillin and sulfa drugs.   REVIEW OF SYSTEMS:   CONSTITUTIONAL: Positive for subjective fever and chills, fatigue and generalized  weakness.  EYES: No changes in vision.  EARS, NOSE, AND THROAT: No tinnitus or ear pain.  RESPIRATORY: Positive for productive cough, shortness of breath, wheezing; no hemoptysis.  CARDIOVASCULAR: No chest pain, orthopnea, edema.  GASTROINTESTINAL: No nausea, vomiting, diarrhea or abdominal pain.  GENITOURINARY: No dysuria, hematuria.  ENDOCRINE: No polyuria, nocturia. HEMATOLOGY: No bleeding. SKIN: No rash or lesion. MUSCULOSKELETAL: No joint pain at this time.   NEUROLOGIC: No focal weakness.  PSYCHIATRY: Positive history of anxiety/depression.   MEDICATIONS AT HOME:  Prior to Admission medications   Medication Sig Start Date End Date Taking? Authorizing Provider  albuterol (PROVENTIL HFA;VENTOLIN HFA) 108 (90 Base) MCG/ACT inhaler Inhale 2 puffs into the lungs every 6 (six) hours as needed for wheezing or shortness of breath. 03/26/17  Yes Rebecka Apley, MD  ALPRAZolam Prudy Feeler) 0.25 MG tablet Take 1 tablet by mouth at bedtime. 08/13/15  Yes [provider]  gabapentin (NEURONTIN) 400 MG capsule Take 1 capsule (400 mg total) by mouth 2 (two) times daily. 06/06/17 08/23/18 Yes Schaevitz, Myra Rude, MD  levothyroxine (SYNTHROID) 150 MCG tablet Take 1 tablet (150 mcg total) by mouth daily. 07/27/17 08/23/18 Yes Summers, Rhonda L, PA-C  mirtazapine (REMERON) 30 MG tablet Take 30 mg by mouth at bedtime.   Yes [provider]  PARoxetine (PAXIL) 40 MG tablet Take 1 tablet (40 mg total) by mouth daily. 07/27/17 08/23/18 Yes Summers, Rhonda L, PA-C  albuterol (PROVENTIL) (2.5  MG/3ML) 0.083% nebulizer solution Take 3 mLs (2.5 mg total) by nebulization every 4 (four) hours as needed for wheezing or shortness of breath. Patient not taking: Reported on 08/23/2018 02/04/17   Irean Hong, MD  metoCLOPramide (REGLAN) 10 MG tablet Take 1 tablet (10 mg total) by mouth every 6 (six) hours as needed. Patient not taking: Reported on 08/23/2018 06/06/17   Myrna Blazer, MD  naproxen  (NAPROSYN) 500 MG tablet Take 1 tablet (500 mg total) by mouth 2 (two) times daily with a meal. Patient not taking: Reported on 08/23/2018 07/27/17   Tommi Rumps, PA-C      PHYSICAL EXAMINATION:   VITAL SIGNS: Blood pressure 104/63, pulse 88, temperature 98.7 F (37.1 C), temperature source Oral, resp. rate 19, height 5\' 4"  (1.626 m), weight 79.4 kg, SpO2 92 %.  GENERAL:  62 y.o.-year-old patient lying in the bed with no acute distress.  Patient is much improved status post IV antibiotics, IV steroids and nebulizer treatment. EYES: Pupils equal, round, reactive to light and accommodation. No scleral icterus. Extraocular muscles intact.  HEENT: Head atraumatic, normocephalic. Oropharynx and nasopharynx clear.  NECK:  Supple, no jugular venous distention. No thyroid enlargement, no tenderness.  LUNGS: Reduced breath sounds and wheezing noted bilaterally. No use of accessory muscles of respiration, at this time.  CARDIOVASCULAR: S1, S2 normal. No S3/S4.  ABDOMEN: Soft, nontender, nondistended. Bowel sounds present. No organomegaly or mass.  EXTREMITIES: No pedal edema, cyanosis, or clubbing.  NEUROLOGIC: No focal weakness. PSYCHIATRIC: The patient is alert and oriented x 3.  SKIN: No obvious rash, lesion, or ulcer.   LABORATORY PANEL:   CBC Recent Labs  Lab 08/23/18 2008  WBC 12.0*  HGB 13.2  HCT 39.1  PLT 232  MCV 87.7  MCH 29.7  MCHC 33.8  RDW 14.0  LYMPHSABS 1.1  MONOABS 0.8  EOSABS 0.2  BASOSABS 0.1   ------------------------------------------------------------------------------------------------------------------  Chemistries  Recent Labs  Lab 08/23/18 2008  NA 141  K 4.1  CL 102  CO2 30  GLUCOSE 137*  BUN 10  CREATININE 0.90  CALCIUM 9.3  AST 19  ALT 10  ALKPHOS 83  BILITOT 0.5   ------------------------------------------------------------------------------------------------------------------ estimated creatinine clearance is 66.9 mL/min (by C-G  formula based on SCr of 0.9 mg/dL). ------------------------------------------------------------------------------------------------------------------ No results for input(s): TSH, T4TOTAL, T3FREE, THYROIDAB in the last 72 hours.  Invalid input(s): FREET3   Coagulation profile No results for input(s): INR, PROTIME in the last 168 hours. ------------------------------------------------------------------------------------------------------------------- No results for input(s): DDIMER in the last 72 hours. -------------------------------------------------------------------------------------------------------------------  Cardiac Enzymes Recent Labs  Lab 08/23/18 2008  TROPONINI <0.03   ------------------------------------------------------------------------------------------------------------------ Invalid input(s): POCBNP  ---------------------------------------------------------------------------------------------------------------  Urinalysis    Component Value Date/Time   COLORURINE YELLOW (A) 08/23/2018 2200   APPEARANCEUR CLEAR (A) 08/23/2018 2200   APPEARANCEUR Turbid 01/16/2013 1541   LABSPEC 1.003 (L) 08/23/2018 2200   LABSPEC 1.025 01/16/2013 1541   PHURINE 6.0 08/23/2018 2200   GLUCOSEU NEGATIVE 08/23/2018 2200   GLUCOSEU Negative 01/16/2013 1541   HGBUR SMALL (A) 08/23/2018 2200   BILIRUBINUR NEGATIVE 08/23/2018 2200   BILIRUBINUR Negative 01/16/2013 1541   KETONESUR 5 (A) 08/23/2018 2200   PROTEINUR NEGATIVE 08/23/2018 2200   NITRITE NEGATIVE 08/23/2018 2200   LEUKOCYTESUR NEGATIVE 08/23/2018 2200   LEUKOCYTESUR 3+ 01/16/2013 1541     RADIOLOGY: Dg Chest 2 View  Result Date: 08/23/2018 CLINICAL DATA:  Fever, shortness of breath, cough and chest congestion for the past week. EXAM: CHEST - 2 VIEW COMPARISON:  03/26/2017. FINDINGS: Normal sized heart. The lungs remain hyperexpanded with interval mild patchy density in both lower lung zones on the frontal view,  not definitely seen on the lateral view. No pleural fluid. Unremarkable bones. IMPRESSION: 1. Interval mild bilateral lower lung zone pneumonia, right greater than left. 2. Stable changes of COPD. Electronically Signed   By: Beckie Salts M.D.   On: 08/23/2018 20:54    EKG: Orders placed or performed during the hospital encounter of 08/23/18  . EKG 12-Lead  . EKG 12-Lead    IMPRESSION AND PLAN:  1.  Acute respiratory failure with hypoxia, secondary to pneumonia and acute COPD exacerbation.  We will start treatment with IV antibiotics, IV steroids, duo nebs and oxygen.  Continue to monitor clinically closely. 2.  Acute COPD exacerbation, secondary to CAP.  See treatment as above, under #1. 3.  CAP, see treatment as above under #1. 4.  Tobacco abuse.  Smoking cessation was discussed with patient in detail.  All the records are reviewed and case discussed with ED provider. Management plans discussed with the patient, who is in agreement.  CODE STATUS: Full    TOTAL TIME TAKING CARE OF THIS PATIENT: 50 minutes.    Cammy Copa M.D on 08/24/2018 at 1:19 AM  Between 7am to 6pm - Pager - [phone]  After 6pm go to [email or web address] - password [email or web address] [facility] [place] at [facility]  [phone]  CC: Primary care physician; Evelene Croon, MD

## 2018-08-24 NOTE — Progress Notes (Signed)
PHARMACIST - PHYSICIAN COMMUNICATION CONCERNING: Antibiotic IV to Oral Route Change Policy  RECOMMENDATION: This patient is receiving azithromycin by the intravenous route.  Based on criteria approved by the Pharmacy and Therapeutics Committee, the antibiotic(s) is/are being converted to the equivalent oral dose form(s).   DESCRIPTION: These criteria include:  Patient being treated for a respiratory tract infection, urinary tract infection, cellulitis or clostridium difficile associated diarrhea if on metronidazole  The patient is not neutropenic and does not exhibit a GI malabsorption state  The patient is eating (either orally or via tube) and/or has been taking other orally administered medications for a least 24 hours  The patient is improving clinically and has a Tmax < 100.5  If you have questions about this conversion, please contact the Pharmacy Department  []  ( [phone] )  [place] [x]  ( [phone] )  [facility] []  ( [phone]  [place] []  ( [phone]  [facility] []  ( [phone] )  [facility]  

## 2018-08-25 LAB — HIV ANTIBODY (ROUTINE TESTING W REFLEX): HIV SCREEN 4TH GENERATION: NONREACTIVE

## 2018-08-25 LAB — GLUCOSE, CAPILLARY: GLUCOSE-CAPILLARY: 100 mg/dL — AB (ref 70–99)

## 2018-08-25 MED ORDER — METHYLPREDNISOLONE SODIUM SUCC 40 MG IJ SOLR
40.0000 mg | Freq: Two times a day (BID) | INTRAMUSCULAR | Status: AC
  Administered 2018-08-25: 40 mg via INTRAVENOUS
  Filled 2018-08-25: qty 1

## 2018-08-25 MED ORDER — PREDNISONE 50 MG PO TABS
50.0000 mg | ORAL_TABLET | Freq: Every day | ORAL | Status: DC
  Administered 2018-08-26: 50 mg via ORAL
  Filled 2018-08-25: qty 1

## 2018-08-25 NOTE — Progress Notes (Signed)
[facility] - [place] at [facility]   PATIENT NAME: Caroline Coleman    MR#:  [id number]  DATE OF BIRTH:  [birth year]  SUBJECTIVE:  CHIEF COMPLAINT:   Chief Complaint  Patient presents with  . Shortness of Breath  . Cough   Patient still has cough, wheezing and the shortness of breath. REVIEW OF SYSTEMS:  Review of Systems  Constitutional: Negative for chills, fever and malaise/fatigue.  HENT: Negative for sore throat.   Eyes: Negative for blurred vision and double vision.  Respiratory: Positive for cough, sputum production, shortness of breath and wheezing. Negative for hemoptysis and stridor.   Cardiovascular: Negative for chest pain, palpitations, orthopnea and leg swelling.  Gastrointestinal: Negative for abdominal pain, blood in stool, diarrhea, melena, nausea and vomiting.  Genitourinary: Negative for dysuria, flank pain and hematuria.  Musculoskeletal: Negative for back pain and joint pain.  Skin: Negative for rash.  Neurological: Negative for dizziness, sensory change, focal weakness, seizures, loss of consciousness, weakness and headaches.  Endo/Heme/Allergies: Negative for polydipsia.  Psychiatric/Behavioral: Negative for depression. The patient is not nervous/anxious.     DRUG ALLERGIES:    VITALS:  Blood pressure (!) 101/59, pulse 77, temperature 98.1 F (36.7 C), temperature source Oral, resp. rate 20, height 5\' 4"  (1.626 m), weight 82.7 kg, SpO2 90 %. PHYSICAL EXAMINATION:  Physical Exam  Constitutional: She is oriented to person, place, and time. She appears well-developed. No distress.  HENT:  Head: Normocephalic.  Mouth/Throat: Oropharynx is clear and moist.  Eyes: Pupils are equal, round, and reactive to light. Conjunctivae and EOM are normal. No scleral icterus.  Neck: Normal range of motion. Neck supple. No JVD present. No tracheal deviation present.  Cardiovascular: Normal rate, regular rhythm and normal heart sounds. Exam reveals  no gallop.  No murmur heard. Pulmonary/Chest: Effort normal. No stridor. No respiratory distress. She has wheezes. She has no rales.  Abdominal: Soft. Bowel sounds are normal. She exhibits no distension. There is no tenderness. There is no rebound.  Musculoskeletal: Normal range of motion. She exhibits no edema or tenderness.  Neurological: She is alert and oriented to person, place, and time. No cranial nerve deficit.  Skin: No rash noted. No erythema.  Psychiatric: She has a normal mood and affect.   LABORATORY PANEL:  Female CBC Recent Labs  Lab 08/24/18 0423  WBC 9.4  HGB 12.3  HCT 35.4  PLT 203   ------------------------------------------------------------------------------------------------------------------ Chemistries  Recent Labs  Lab 08/23/18 2008 08/24/18 0423  NA 141 140  K 4.1 3.6  CL 102 106  CO2 30 26  GLUCOSE 137* 275*  BUN 10 9  CREATININE 0.90 0.78  CALCIUM 9.3 8.7*  AST 19  --   ALT 10  --   ALKPHOS 83  --   BILITOT 0.5  --    RADIOLOGY:  No results found. ASSESSMENT AND PLAN:   1.  Acute respiratory failure with hypoxia, secondary to pneumonia and acute COPD exacerbation.   Continue Zithromax and Rocephin, taper steroids, duo nebs.  Robitussin as needed.  Follow patient.  2. Tobacco abuse.  Smoking cessation was counseled for 4 minutes, nicotine patch.  All the records are reviewed and case discussed with Care Management/Social Worker. Management plans discussed with the patient, family and they are in agreement.  CODE STATUS: Full Code  TOTAL TIME TAKING CARE OF THIS PATIENT: 26 minutes.   More than 50% of the time was spent in counseling/coordination of care: YES  POSSIBLE D/C IN 2  DAYS, DEPENDING ON CLINICAL CONDITION.   Shaune Pollack M.D on 08/25/2018 at 2:45 PM  Between 7am to 6pm - Pager - [phone]  After 6pm go to [email or web address] - Therapist, nutritional Hospitalists

## 2018-08-26 LAB — GLUCOSE, CAPILLARY: GLUCOSE-CAPILLARY: 91 mg/dL (ref 70–99)

## 2018-08-26 MED ORDER — NICOTINE 14 MG/24HR TD PT24: 14.0000 mg | MEDICATED_PATCH | Freq: Every day | TRANSDERMAL | 0 refills | Status: DC

## 2018-08-26 MED ORDER — AZITHROMYCIN 500 MG PO TABS: 500.0000 mg | ORAL_TABLET | Freq: Every day | ORAL | 0 refills | Status: DC

## 2018-08-26 MED ORDER — PREDNISONE 20 MG PO TABS: 40.0000 mg | ORAL_TABLET | Freq: Every day | ORAL | 0 refills | Status: DC

## 2018-08-26 MED ORDER — GUAIFENESIN 100 MG/5ML PO SOLN: 5.0000 mL | ORAL | 0 refills | Status: DC | PRN

## 2018-08-26 NOTE — Discharge Instructions (Signed)
Smoking cessation  

## 2018-08-26 NOTE — Discharge Summary (Signed)
[facility] - [place] at [facility]   PATIENT NAME: Caroline Coleman    MR#:  [id number]  DATE OF BIRTH:  Apr 09, 1956  DATE OF ADMISSION:  08/23/2018   ADMITTING PHYSICIAN: Cammy Copa, MD  DATE OF DISCHARGE: 08/26/2018  8:00 AM  PRIMARY CARE PHYSICIAN: Evelene Croon, MD   ADMISSION DIAGNOSIS:  Hypoxia [R09.02] Community acquired pneumonia, unspecified laterality [J18.9] DISCHARGE DIAGNOSIS:  Active Problems:   Acute respiratory failure (HCC)  SECONDARY DIAGNOSIS:   Past Medical History:  Diagnosis Date  . Anxiety   . COPD (chronic obstructive pulmonary disease) (HCC)   . Depression   . Thyroid disease    HOSPITAL COURSE:  1.Acute respiratory failure with hypoxia,secondary to pneumonia and acute COPD exacerbation. The patient was treated with Zithromax and Rocephin,  IV steroids, duo nebs.  Robitussin as needed.  Her symptoms has much improved. Changed to p.o. Zithromax and prednisone.  2.Tobacco abuse.Smoking cessation was counseled for 4 minutes, nicotine patch.  DISCHARGE CONDITIONS:  Stable, discharged to home today. CONSULTS OBTAINED:   DISCHARGE MEDICATIONS:     Dose Due Morning Afternoon Evening Bedtime As Needed   albuterol 108 (90 Base) MCG/ACT inhaler  Commonly known as: PROVENTIL HFA;VENTOLIN HFA  Inhale 2 puffs into the lungs every 6 (six) hours as needed for wheezing or shortness of breath.  What changed: Another medication with the same name was removed. Continue taking this medication, and follow the directions you see here.  Next dose due:           ALPRAZolam 0.25 MG tablet  Commonly known as: XANAX  Take 1 tablet by mouth at bedtime.  Next dose due:           azithromycin 500 MG tablet  Commonly known as: ZITHROMAX  Take 1 tablet (500 mg total) by mouth daily.  Next dose due:           gabapentin 400 MG capsule  Commonly known as: NEURONTIN  Take 1 capsule (400 mg total) by mouth 2 (two) times daily.  Next  dose due:           guaiFENesin 100 MG/5ML Soln  Commonly known as: ROBITUSSIN  Take 5 mLs (100 mg total) by mouth every 4 (four) hours as needed for cough or to loosen phlegm.  Next dose due:           levothyroxine 150 MCG tablet  Commonly known as: SYNTHROID, LEVOTHROID  Take 1 tablet (150 mcg total) by mouth daily.  Next dose due:           metoCLOPramide 10 MG tablet  Commonly known as: REGLAN  Take 1 tablet (10 mg total) by mouth every 6 (six) hours as needed.  Next dose due:           mirtazapine 30 MG tablet  Commonly known as: REMERON  Take 30 mg by mouth at bedtime.  Next dose due:           nicotine 14 mg/24hr patch  Commonly known as: NICODERM CQ - dosed in mg/24 hours  Place 1 patch (14 mg total) onto the skin daily.  Next dose due:           PARoxetine 40 MG tablet  Commonly known as: PAXIL  Take 1 tablet (40 mg total) by mouth daily.  Next dose due:           predniSONE 20 MG tablet  Commonly known as: DELTASONE  Take 2 tablets (40  mg total) by mouth daily with breakfast.  Next dose due:            DISCHARGE INSTRUCTIONS:  See AVS.  If you experience worsening of your admission symptoms, develop shortness of breath, life threatening emergency, suicidal or homicidal thoughts you must seek medical attention immediately by calling 911 or calling your MD immediately  if symptoms less severe.  You Must read complete instructions/literature along with all the possible adverse reactions/side effects for all the Medicines you take and that have been prescribed to you. Take any new Medicines after you have completely understood and accpet all the possible adverse reactions/side effects.   Please note  You were cared for by a hospitalist during your hospital stay. If you have any questions about your discharge medications or the care you received while you were in the hospital after you are discharged, you can call the unit and asked to speak with the hospitalist on call  if the hospitalist that took care of you is not available. Once you are discharged, your primary care physician will handle any further medical issues. Please note that NO REFILLS for any discharge medications will be authorized once you are discharged, as it is imperative that you return to your primary care physician (or establish a relationship with a primary care physician if you do not have one) for your aftercare needs so that they can reassess your need for medications and monitor your lab values.    On the day of Discharge:  VITAL SIGNS:  Blood pressure 117/69, pulse 79, temperature 97.9 F (36.6 C), temperature source Oral, resp. rate 18, height 5\' 4"  (1.626 m), weight 82.7 kg, SpO2 99 %. PHYSICAL EXAMINATION:  GENERAL:  62 y.o.-year-old patient lying in the bed with no acute distress.  EYES: Pupils equal, round, reactive to light and accommodation. No scleral icterus. Extraocular muscles intact.  HEENT: Head atraumatic, normocephalic. Oropharynx and nasopharynx clear.  NECK:  Supple, no jugular venous distention. No thyroid enlargement, no tenderness.  LUNGS: Normal breath sounds bilaterally, no wheezing, rales,rhonchi or crepitation. No use of accessory muscles of respiration.  CARDIOVASCULAR: S1, S2 normal. No murmurs, rubs, or gallops.  ABDOMEN: Soft, non-tender, non-distended. Bowel sounds present. No organomegaly or mass.  EXTREMITIES: No pedal edema, cyanosis, or clubbing.  NEUROLOGIC: Cranial nerves II through XII are intact. Muscle strength 5/5 in all extremities. Sensation intact. Gait not checked.  PSYCHIATRIC: The patient is alert and oriented x 3.  SKIN: No obvious rash, lesion, or ulcer.  DATA REVIEW:   CBC Recent Labs  Lab 08/24/18 0423  WBC 9.4  HGB 12.3  HCT 35.4  PLT 203    Chemistries  Recent Labs  Lab 08/23/18 2008 08/24/18 0423  NA 141 140  K 4.1 3.6  CL 102 106  CO2 30 26  GLUCOSE 137* 275*  BUN 10 9  CREATININE 0.90 0.78  CALCIUM 9.3 8.7*    AST 19  --   ALT 10  --   ALKPHOS 83  --   BILITOT 0.5  --      Microbiology Results  Results for orders placed or performed during the hospital encounter of 08/23/18  Blood Culture (routine x 2)     Status: None (Preliminary result)   Collection Time: 08/23/18  8:04 PM  Result Value Ref Range Status   Specimen Description BLOOD RIGHT ANTECUBITAL  Final   Special Requests   Final    BOTTLES DRAWN AEROBIC AND ANAEROBIC Blood Culture results may  not be optimal due to an excessive volume of blood received in culture bottles   Culture   Final    NO GROWTH 3 DAYS Performed at [facility], [address] [place], [place] [address]    Report Status PENDING  Incomplete  Blood Culture (routine x 2)     Status: None (Preliminary result)   Collection Time: 08/23/18  8:08 PM  Result Value Ref Range Status   Specimen Description BLOOD LEFT ANTECUBITAL  Final   Special Requests   Final    BOTTLES DRAWN AEROBIC AND ANAEROBIC Blood Culture results may not be optimal due to an excessive volume of blood received in culture bottles   Culture   Final    NO GROWTH 3 DAYS Performed at [facility], [address], [place], [place] [address]    Report Status PENDING  Incomplete    RADIOLOGY:  No results found.   Management plans discussed with the patient, family and they are in agreement.  CODE STATUS: Prior   TOTAL TIME TAKING CARE OF THIS PATIENT: 20 minutes.    Shaune Pollack M.D on 08/26/2018 at 4:37 PM  Between 7am to 6pm - Pager - [phone]  After 6pm go to [email or web address] - Social research officer, government  [facility] [place] [facility]  Office  [phone]  CC: Primary care physician; Evelene Croon, MD   Note: This dictation was prepared with Dragon dictation along with smaller phrase technology. Any transcriptional errors that result from this process are unintentional.

## 2018-08-26 NOTE — Progress Notes (Signed)
Patient discharging home. Instructions and prescriptions given to patient, verbalized understanding. IV removed.

## 2018-08-27 LAB — LACTIC ACID, PLASMA

## 2018-08-28 LAB — CULTURE, BLOOD (ROUTINE X 2)
Culture: NO GROWTH
Culture: NO GROWTH

## 2019-07-03 ENCOUNTER — Emergency Department: Payer: Medicaid Other

## 2019-07-03 ENCOUNTER — Encounter: Payer: Self-pay | Admitting: Emergency Medicine

## 2019-07-03 ENCOUNTER — Emergency Department
Admission: EM | Admit: 2019-07-03 | Discharge: 2019-07-03 | Disposition: A | Discharge status: Home / Self Care | Payer: Medicaid Other | Attending: Emergency Medicine | Admitting: Emergency Medicine

## 2019-07-03 ENCOUNTER — Other Ambulatory Visit: Payer: Self-pay

## 2019-07-03 DIAGNOSIS — J441 Chronic obstructive pulmonary disease with (acute) exacerbation: Secondary | ICD-10-CM | POA: Diagnosis not present

## 2019-07-03 DIAGNOSIS — Z20828 Contact with and (suspected) exposure to other viral communicable diseases: Secondary | ICD-10-CM | POA: Insufficient documentation

## 2019-07-03 DIAGNOSIS — R21 Rash and other nonspecific skin eruption: Secondary | ICD-10-CM

## 2019-07-03 DIAGNOSIS — F17210 Nicotine dependence, cigarettes, uncomplicated: Secondary | ICD-10-CM | POA: Insufficient documentation

## 2019-07-03 DIAGNOSIS — R0602 Shortness of breath: Secondary | ICD-10-CM | POA: Diagnosis present

## 2019-07-03 DIAGNOSIS — Z79899 Other long term (current) drug therapy: Secondary | ICD-10-CM | POA: Insufficient documentation

## 2019-07-03 LAB — BRAIN NATRIURETIC PEPTIDE: B Natriuretic Peptide: 68 pg/mL (ref 0.0–100.0)

## 2019-07-03 LAB — CBC
HCT: 45.6 % (ref 36.0–46.0)
Hemoglobin: 14.4 g/dL (ref 12.0–15.0)
MCH: 28.1 pg (ref 26.0–34.0)
MCHC: 31.6 g/dL (ref 30.0–36.0)
MCV: 88.9 fL (ref 80.0–100.0)
Platelets: 182 10*3/uL (ref 150–400)
RBC: 5.13 MIL/uL — ABNORMAL HIGH (ref 3.87–5.11)
RDW: 14.9 % (ref 11.5–15.5)
WBC: 6.8 10*3/uL (ref 4.0–10.5)
nRBC: 0 % (ref 0.0–0.2)

## 2019-07-03 LAB — BASIC METABOLIC PANEL
Anion gap: 7 (ref 5–15)
BUN: 8 mg/dL (ref 8–23)
CO2: 27 mmol/L (ref 22–32)
Calcium: 9.1 mg/dL (ref 8.9–10.3)
Chloride: 106 mmol/L (ref 98–111)
Creatinine, Ser: 0.78 mg/dL (ref 0.44–1.00)
GFR calc Af Amer: >60 mL/min (ref >60)
GFR calc non Af Amer: >60 mL/min (ref >60)
Glucose, Bld: 111 mg/dL — ABNORMAL HIGH (ref 70–99)
Potassium: 3.9 mmol/L (ref 3.5–5.1)
Sodium: 140 mmol/L (ref 135–145)

## 2019-07-03 LAB — TROPONIN I (HIGH SENSITIVITY): Troponin I (High Sensitivity): 2 ng/L (ref <18)

## 2019-07-03 MED ORDER — AZITHROMYCIN 250 MG PO TABS: ORAL_TABLET | ORAL | 0 refills | Status: AC

## 2019-07-03 MED ORDER — ACETAMINOPHEN 325 MG PO TABS
650.0000 mg | ORAL_TABLET | Freq: Once | ORAL | Status: AC
  Administered 2019-07-03: 650 mg via ORAL
  Filled 2019-07-03: qty 2

## 2019-07-03 MED ORDER — KETOROLAC TROMETHAMINE 60 MG/2ML IM SOLN
30.0000 mg | Freq: Once | INTRAMUSCULAR | Status: AC
  Administered 2019-07-03: 30 mg via INTRAMUSCULAR
  Filled 2019-07-03: qty 2

## 2019-07-03 MED ORDER — PREDNISONE 20 MG PO TABS: 40.0000 mg | ORAL_TABLET | Freq: Every day | ORAL | 0 refills | Status: AC

## 2019-07-03 MED ORDER — ALBUTEROL SULFATE HFA 108 (90 BASE) MCG/ACT IN AERS: 2.0000 | INHALATION_SPRAY | Freq: Four times a day (QID) | RESPIRATORY_TRACT | 0 refills | Status: AC | PRN

## 2019-07-03 MED ORDER — IPRATROPIUM-ALBUTEROL 0.5-2.5 (3) MG/3ML IN SOLN
3.0000 mL | Freq: Once | RESPIRATORY_TRACT | Status: AC
  Administered 2019-07-03: 3 mL via RESPIRATORY_TRACT
  Filled 2019-07-03: qty 3

## 2019-07-03 MED ORDER — ALBUTEROL SULFATE HFA 108 (90 BASE) MCG/ACT IN AERS: 2.0000 | INHALATION_SPRAY | Freq: Four times a day (QID) | RESPIRATORY_TRACT | 0 refills | Status: DC | PRN

## 2019-07-03 MED ORDER — PREDNISONE 20 MG PO TABS
60.0000 mg | ORAL_TABLET | Freq: Once | ORAL | Status: AC
  Administered 2019-07-03: 60 mg via ORAL
  Filled 2019-07-03: qty 3

## 2019-07-03 NOTE — Discharge Instructions (Addendum)
We are treating you for a COPD exacerbation.  The steroids will also help with the rash.  I given you dermatology's phone number if the rash is not clearing up you can follow-up with them.  Seems to be more likely consistent with a bug bite that was then itched.  reTurn to the ER for worsening shortness of breath or any other concerns.  Stay quarantine at home until your results come back.  Coronavirus.

## 2019-07-03 NOTE — ED Provider Notes (Signed)
[facility] Emergency Department Provider Note  ____________________________________________   First MD Initiated Contact with Patient 07/03/19 1936     (approximate)  I have reviewed the triage vital signs and the nursing notes.   HISTORY  Chief Complaint Shortness of Breath and Skin Ulcer    HPI Caroline Coleman is a 63 y.o. female with COPD who presents with worsening shortness of breath.  Patient denies any fever or coughs at home.  Patient has some bilateral scabbing on her legs patient versus her shortness of breath has been gone for 2 days.  Shortness of breath has been worsening, nothing makes it better, nothing makes it worse.  It has been associated with a cough without fever.  She is also had a week of some diarrhea without any abdominal tenderness.  She also notices for about a week she is had a rash that scattered circular bumps that she will itch and they will crust over.  It is only located on her legs.  She has some pain associated with them.  She denies any known coronavirus contacts.  Denies history of PE, unilateral leg swelling, recent travel, recent surgery.       Past Medical History:  Diagnosis Date  . Anxiety   . COPD (chronic obstructive pulmonary disease) ([place])   . Depression   . Thyroid disease     Patient Active Problem List   Diagnosis Date Noted  . Acute respiratory failure ([place]) 08/24/2018  . Self-inflicted laceration of wrist 08/03/2016  . Substance induced mood disorder ([place]) 08/03/2016  . Alcohol abuse 08/03/2016  . Panic disorder with agoraphobia 12/14/2015  . Insomnia 12/14/2015  . Depression 12/14/2015    Past Surgical History:  Procedure Laterality Date  . EYE SURGERY    . HIP SURGERY      Prior to Admission medications   Medication Sig Start Date End Date Taking? Authorizing Provider  albuterol (PROVENTIL HFA;VENTOLIN HFA) 108 (90 Base) MCG/ACT inhaler Inhale 2 puffs into the lungs every 6 (six)  hours as needed for wheezing or shortness of breath. 03/26/17   Loney Hering, MD  ALPRAZolam Duanne Moron) 0.25 MG tablet Take 1 tablet by mouth at bedtime. 08/13/15   [provider]  azithromycin (ZITHROMAX) 500 MG tablet Take 1 tablet (500 mg total) by mouth daily. 08/26/18   Demetrios Loll, MD  gabapentin (NEURONTIN) 400 MG capsule Take 1 capsule (400 mg total) by mouth 2 (two) times daily. 06/06/17 08/23/18  Orbie Pyo, MD  guaiFENesin (ROBITUSSIN) 100 MG/5ML SOLN Take 5 mLs (100 mg total) by mouth every 4 (four) hours as needed for cough or to loosen phlegm. 08/26/18   Demetrios Loll, MD  levothyroxine (SYNTHROID) 150 MCG tablet Take 1 tablet (150 mcg total) by mouth daily. 07/27/17 08/23/18  Johnn Hai, PA-C  metoCLOPramide (REGLAN) 10 MG tablet Take 1 tablet (10 mg total) by mouth every 6 (six) hours as needed. Patient not taking: Reported on 08/23/2018 06/06/17   Orbie Pyo, MD  mirtazapine (REMERON) 30 MG tablet Take 30 mg by mouth at bedtime.    [provider]  nicotine (NICODERM CQ - DOSED IN MG/24 HOURS) 14 mg/24hr patch Place 1 patch (14 mg total) onto the skin daily. 08/26/18   Demetrios Loll, MD  PARoxetine (PAXIL) 40 MG tablet Take 1 tablet (40 mg total) by mouth daily. 07/27/17 08/23/18  Johnn Hai, PA-C  predniSONE (DELTASONE) 20 MG tablet Take 2 tablets (40 mg total) by mouth  daily with breakfast. 08/26/18   Shaune Pollackhen, Qing, MD    Allergies Penicillins and Sulfa antibiotics  No family history on file.  Social History Social History   Tobacco Use  . Smoking status: Current Every Day Smoker    Packs/day: 1.00    Types: Cigarettes  . Smokeless tobacco: Current User  Substance Use Topics  . Alcohol use: No  . Drug use: No      Review of Systems Constitutional: No fever/chills Eyes: No visual changes. ENT: No sore throat. Cardiovascular: Some mild chest discomfort associated with shortness of breath Respiratory: Positive shortness of  breath Gastrointestinal: No abdominal pain.  No nausea, no vomiting.  Positive for diarrhea no constipation. Genitourinary: Negative for dysuria. Musculoskeletal: Negative for back pain. Skin: Positive for rash Neurological: Negative for headaches, focal weakness or numbness. All other ROS negative ____________________________________________   PHYSICAL EXAM:  VITAL SIGNS: ED Triage Vitals [07/03/19 1648]  Enc Vitals Group     BP 130/65     Pulse Rate 79     Resp 20     Temp 98.6 F (37 C)     Temp Source Oral     SpO2 97 %     Weight 170 lb (77.1 kg)     Height 5\' 4"  (1.626 m)     Head Circumference      Peak Flow      Pain Score 0     Pain Loc      Pain Edu?      Excl. in GC?     Constitutional: Alert and oriented. Well appearing and in no acute distress. Eyes: Conjunctivae are normal. EOMI. Head: Atraumatic. Nose: No congestion/rhinnorhea. Mouth/Throat: Mucous membranes are moist.   Neck: No stridor. Trachea Midline. FROM Cardiovascular: Normal rate, regular rhythm. Grossly normal heart sounds.  Good peripheral circulation. Respiratory: Normal respiratory effort.  Bilateral expiratory wheezing Gastrointestinal: Soft and nontender. No distention. No abdominal bruits.  Musculoskeletal: No lower extremity tenderness nor edema.  No joint effusions.  Homan sign negative Neurologic:  Normal speech and language. No gross focal neurologic deficits are appreciated.  Skin:  Skin is warm, dry and intact.  Nickel sized circular areas of redness with some crusting over them. Psychiatric: Mood and affect are normal. Speech and behavior are normal. GU: Deferred   ____________________________________________   LABS (all labs ordered are listed, but only abnormal results are displayed)  Labs Reviewed  BASIC METABOLIC PANEL - Abnormal; Notable for the following components:      Result Value   Glucose, Bld 111 (*)    All other components within normal limits  CBC - Abnormal;  Notable for the following components:   RBC 5.13 (*)    All other components within normal limits  SARS CORONAVIRUS 2  BRAIN NATRIURETIC PEPTIDE  TROPONIN I (HIGH SENSITIVITY)  TROPONIN I (HIGH SENSITIVITY)   ____________________________________________   ED ECG REPORT I, Concha SeMary E Tracie Lindbloom, the attending physician, personally viewed and interpreted this ECG.  EKG is normal sinus rate of 74, no ST elevation, no T wave inversion, normal intervals ____________________________________________  RADIOLOGY Vela ProseI, Alonda Weaber E Kyon Bentler, personally viewed and evaluated these images (plain radiographs) as part of my medical decision making, as well as reviewing the written report by the radiologist.  ED MD interpretation: Chest x-ray without evidence of pneumonia.  Changes consistent with COPD  Official radiology report(s): Dg Chest 2 View  Result Date: 07/03/2019 CLINICAL DATA:  Worsening shortness of breath. EXAM: CHEST - 2 VIEW COMPARISON:  03/26/2017. 08/23/2018. FINDINGS: Normal sized heart. Clear lungs. The lungs remain mildly hyperexpanded with mild peribronchial thickening. Resolved patchy densities on the right. Minimal lower thoracic spine degenerative changes. Old, healed bilateral rib fractures. IMPRESSION: No acute abnormality. Stable mild changes of COPD and chronic bronchitis. Electronically Signed   By: Beckie SaltsSteven  Reid M.D.   On: 07/03/2019 18:02    ____________________________________________   PROCEDURES  Procedure(s) performed (including Critical Care):  Procedures   ____________________________________________   INITIAL IMPRESSION / ASSESSMENT AND PLAN / ED COURSE   Caroline Coleman was evaluated in Emergency Department on 07/03/2019 for the symptoms described in the history of present illness. She was evaluated in the context of the global COVID-19 pandemic, which necessitated consideration that the patient might be at risk for infection with the SARS-CoV-2 virus that causes  COVID-19. Institutional protocols and algorithms that pertain to the evaluation of patients at risk for COVID-19 are in a state of rapid change based on information released by regulatory bodies including the CDC and federal and state organizations. These policies and algorithms were followed during the patient's care in the ED.     Patient symptoms is most consistent with COPD exacerbation.  Will get chest x-ray to rule out pneumonia.  Low suspicion for ACS but will get cardiac markers to confirm.  Patient has no other risk factors for pulmonary embolism and exam is more consistent with COPD.  Unclear what is the cause of patient's rash.  Seems more consistent with something of bug bite that patient then itched.  Discussed with patient treatment for her COPD exacerbation with prednisone and that this would actually help with the rash as well.  Will patient dermatology's number for follow-up.  Rashes not consistent with SJS, TEN, or other acute rash.  Also get coronavirus swab for patient.   -Labs are reassuring with a normal kidney function.  White count without evidence of infection.  Hemoglobin is at baseline.  Troponin is negative for ACS proBNP is negative  -Patient feels better after treatments.  Patient is amatory sat was normal.  Patient will be discharged home at this time with return precautions.  Told to quarantine while waiting results.   I discussed the provisional nature of ED diagnosis, the treatment so far, the ongoing plan of care, follow up appointments and return precautions with the patient and any family or support people present. They expressed understanding and agreed with the plan, discharged home.      ____________________________________________   FINAL CLINICAL IMPRESSION(S) / ED DIAGNOSES   Final diagnoses:  COPD exacerbation (HCC)  Rash and nonspecific skin eruption     MEDICATIONS GIVEN DURING THIS VISIT:  Medications  ketorolac (TORADOL) injection 30 mg  (30 mg Intramuscular Given 07/03/19 1959)  acetaminophen (TYLENOL) tablet 650 mg (650 mg Oral Given 07/03/19 2003)  predniSONE (DELTASONE) tablet 60 mg (60 mg Oral Given 07/03/19 2003)  ipratropium-albuterol (DUONEB) 0.5-2.5 (3) MG/3ML nebulizer solution 3 mL (3 mLs Nebulization Given 07/03/19 2004)  ipratropium-albuterol (DUONEB) 0.5-2.5 (3) MG/3ML nebulizer solution 3 mL (3 mLs Nebulization Given 07/03/19 2004)     ED Discharge Orders         Ordered    azithromycin (ZITHROMAX Z-PAK) 250 MG tablet     07/03/19 1956    predniSONE (DELTASONE) 20 MG tablet  Daily     07/03/19 1956    albuterol (VENTOLIN HFA) 108 (90 Base) MCG/ACT inhaler  Every 6 hours PRN     07/03/19 1956  Note:  This document was prepared using Dragon voice recognition software and may include unintentional dictation errors.   Concha SeFunke, Darly Massi E, MD 07/03/19 2035

## 2019-07-03 NOTE — ED Triage Notes (Signed)
Patient to ED via POV. Reports for the last week she has had worsening SOB. History of COPD. Denies new cough or fevers at home. Patient also reports scabbing on bilateral legs. Reports they fill with fluid and then burst and then scab over. Patient also complaining of nausea and diarrhea x1 week.

## 2019-07-03 NOTE — ED Notes (Signed)
Pt talking with EDP Funke. Red spots noted on pt's legs. Pt states they're painful. Pt c/o SOB.

## 2019-07-03 NOTE — ED Notes (Signed)
O2 drops to 93% while pt walks; pt states this is not unusual as she has a long history of this happening at home. Pt's sat immediately inc to 100% after sitting on edge of bed for 1 minute. Pt denies SOB or inc WOB.

## 2019-07-03 NOTE — ED Notes (Signed)
Pt educated about d/c info. Teach back method used. Pt stated understanding.

## 2019-07-04 LAB — SARS CORONAVIRUS 2 (TAT 6-24 HRS): SARS Coronavirus 2: NEGATIVE

## 2020-11-04 ENCOUNTER — Other Ambulatory Visit: Payer: Self-pay

## 2020-11-04 ENCOUNTER — Encounter: Payer: Self-pay | Admitting: Podiatry

## 2020-11-04 ENCOUNTER — Ambulatory Visit (INDEPENDENT_AMBULATORY_CARE_PROVIDER_SITE_OTHER): Payer: Medicaid Other | Admitting: Podiatry

## 2020-11-04 DIAGNOSIS — M7741 Metatarsalgia, right foot: Secondary | ICD-10-CM

## 2020-11-04 DIAGNOSIS — M2041 Other hammer toe(s) (acquired), right foot: Secondary | ICD-10-CM | POA: Diagnosis not present

## 2020-11-04 DIAGNOSIS — M2011 Hallux valgus (acquired), right foot: Secondary | ICD-10-CM | POA: Diagnosis not present

## 2020-11-04 DIAGNOSIS — R52 Pain, unspecified: Secondary | ICD-10-CM

## 2020-11-04 DIAGNOSIS — L84 Corns and callosities: Secondary | ICD-10-CM | POA: Diagnosis not present

## 2020-11-04 DIAGNOSIS — M21612 Bunion of left foot: Secondary | ICD-10-CM

## 2020-11-04 DIAGNOSIS — M21611 Bunion of right foot: Secondary | ICD-10-CM

## 2020-11-04 DIAGNOSIS — M2012 Hallux valgus (acquired), left foot: Secondary | ICD-10-CM | POA: Diagnosis not present

## 2020-11-04 DIAGNOSIS — M7742 Metatarsalgia, left foot: Secondary | ICD-10-CM

## 2020-11-04 DIAGNOSIS — M2042 Other hammer toe(s) (acquired), left foot: Secondary | ICD-10-CM

## 2020-11-06 NOTE — Progress Notes (Signed)
  Subjective:  Patient ID: Caroline Coleman, female    DOB: [birth year],  MRN: [id number]  Chief Complaint  Patient presents with  . Plantar Warts    Patient presents today for painful plantar warts bilat forefeet and painful callouses medial sides of bilat hallux x years off and on. "It burns like fire when I walk"  . Callouses    64 y.o. female presents with the above complaint. History confirmed with patient.   Objective:  Physical Exam: warm, good capillary refill, no trophic changes or ulcerative lesions, normal DP and PT pulses and normal sensory exam.  Plantar submet 1 calluses and medial pinch calluses of the hallux bilaterally.  Hallux valgus and semireducible hammertoe contractures bilaterally.  Assessment:   1. Callus of foot   2. Hallux valgus with bunions, left   3. Hallux valgus with bunions, right   4. Hammertoe of right foot   5. Hammertoe of left foot   6. Metatarsalgia of both feet      Plan:  Patient was evaluated and treated and all questions answered.   All symptomatic hyperkeratoses were safely debrided with a sterile #15 blade to patient's level of comfort without incident. We discussed preventative and palliative care of these lesions including supportive and accommodative shoegear, padding, prefabricated and custom molded accommodative orthoses, use of a pumice stone and lotions/creams daily.  Advised to use urea cream  Discussed with foot inserts could help support this.  She also has significant hallux valgus and hammertoe contractures which I think are contributing to this.  Currently she smokes significantly and I do not think she would be interested surgical candidate due to this.  If she is able to quit smoking and would consider surgical intervention.  Return if symptoms worsen or fail to improve.

## 2021-01-27 ENCOUNTER — Other Ambulatory Visit: Payer: Self-pay

## 2021-01-27 ENCOUNTER — Emergency Department: Payer: Medicaid Other

## 2021-01-27 ENCOUNTER — Observation Stay
Admission: EM | Admit: 2021-01-27 | Discharge: 2021-01-28 | Disposition: A | Discharge status: Home / Self Care | Payer: Medicaid Other | Attending: Student | Admitting: Student

## 2021-01-27 ENCOUNTER — Encounter: Payer: Self-pay | Admitting: Emergency Medicine

## 2021-01-27 DIAGNOSIS — F17213 Nicotine dependence, cigarettes, with withdrawal: Secondary | ICD-10-CM | POA: Diagnosis not present

## 2021-01-27 DIAGNOSIS — F17210 Nicotine dependence, cigarettes, uncomplicated: Secondary | ICD-10-CM | POA: Diagnosis not present

## 2021-01-27 DIAGNOSIS — J9600 Acute respiratory failure, unspecified whether with hypoxia or hypercapnia: Secondary | ICD-10-CM

## 2021-01-27 DIAGNOSIS — E039 Hypothyroidism, unspecified: Secondary | ICD-10-CM | POA: Diagnosis not present

## 2021-01-27 DIAGNOSIS — J441 Chronic obstructive pulmonary disease with (acute) exacerbation: Secondary | ICD-10-CM

## 2021-01-27 DIAGNOSIS — Z79899 Other long term (current) drug therapy: Secondary | ICD-10-CM | POA: Diagnosis not present

## 2021-01-27 DIAGNOSIS — J40 Bronchitis, not specified as acute or chronic: Secondary | ICD-10-CM

## 2021-01-27 DIAGNOSIS — J9601 Acute respiratory failure with hypoxia: Principal | ICD-10-CM | POA: Insufficient documentation

## 2021-01-27 DIAGNOSIS — Z20822 Contact with and (suspected) exposure to covid-19: Secondary | ICD-10-CM | POA: Diagnosis not present

## 2021-01-27 DIAGNOSIS — R0602 Shortness of breath: Secondary | ICD-10-CM | POA: Diagnosis present

## 2021-01-27 DIAGNOSIS — F32A Depression, unspecified: Secondary | ICD-10-CM | POA: Diagnosis present

## 2021-01-27 DIAGNOSIS — F17200 Nicotine dependence, unspecified, uncomplicated: Secondary | ICD-10-CM | POA: Diagnosis present

## 2021-01-27 LAB — COMPREHENSIVE METABOLIC PANEL
ALT: 12 U/L (ref 0–44)
AST: 19 U/L (ref 15–41)
Albumin: 4.3 g/dL (ref 3.5–5.0)
Alkaline Phosphatase: 89 U/L (ref 38–126)
Anion gap: 9 (ref 5–15)
BUN: 6 mg/dL — ABNORMAL LOW (ref 8–23)
CO2: 30 mmol/L (ref 22–32)
Calcium: 9.3 mg/dL (ref 8.9–10.3)
Chloride: 97 mmol/L — ABNORMAL LOW (ref 98–111)
Creatinine, Ser: 0.65 mg/dL (ref 0.44–1.00)
GFR, Estimated: >60 mL/min (ref >60)
Glucose, Bld: 181 mg/dL — ABNORMAL HIGH (ref 70–99)
Potassium: 3.5 mmol/L (ref 3.5–5.1)
Sodium: 136 mmol/L (ref 135–145)
Total Bilirubin: 0.6 mg/dL (ref 0.3–1.2)
Total Protein: 8.1 g/dL (ref 6.5–8.1)

## 2021-01-27 LAB — CBC
HCT: 48 % — ABNORMAL HIGH (ref 36.0–46.0)
Hemoglobin: 15.2 g/dL — ABNORMAL HIGH (ref 12.0–15.0)
MCH: 27.9 pg (ref 26.0–34.0)
MCHC: 31.7 g/dL (ref 30.0–36.0)
MCV: 88.1 fL (ref 80.0–100.0)
Platelets: 183 10*3/uL (ref 150–400)
RBC: 5.45 MIL/uL — ABNORMAL HIGH (ref 3.87–5.11)
RDW: 14.6 % (ref 11.5–15.5)
WBC: 9.8 10*3/uL (ref 4.0–10.5)
nRBC: 0 % (ref 0.0–0.2)

## 2021-01-27 LAB — RESP PANEL BY RT-PCR (FLU A&B, COVID) ARPGX2
Influenza A by PCR: NEGATIVE
Influenza B by PCR: NEGATIVE
SARS Coronavirus 2 by RT PCR: NEGATIVE

## 2021-01-27 LAB — TROPONIN I (HIGH SENSITIVITY)
Troponin I (High Sensitivity): 4 ng/L (ref <18)
Troponin I (High Sensitivity): 4 ng/L (ref <18)

## 2021-01-27 LAB — HIV ANTIBODY (ROUTINE TESTING W REFLEX): HIV Screen 4th Generation wRfx: NONREACTIVE

## 2021-01-27 MED ORDER — ENOXAPARIN SODIUM 40 MG/0.4ML ~~LOC~~ SOLN
40.0000 mg | SUBCUTANEOUS | Status: DC
  Administered 2021-01-27: 40 mg via SUBCUTANEOUS
  Filled 2021-01-27: qty 0.4

## 2021-01-27 MED ORDER — IPRATROPIUM-ALBUTEROL 0.5-2.5 (3) MG/3ML IN SOLN
3.0000 mL | Freq: Once | RESPIRATORY_TRACT | Status: AC
  Administered 2021-01-27: 3 mL via RESPIRATORY_TRACT
  Filled 2021-01-27: qty 3

## 2021-01-27 MED ORDER — SODIUM CHLORIDE 0.9 % IV SOLN: 250.0000 mL | INTRAVENOUS | Status: DC | PRN

## 2021-01-27 MED ORDER — SODIUM CHLORIDE 0.9% FLUSH: 3.0000 mL | INTRAVENOUS | Status: DC | PRN

## 2021-01-27 MED ORDER — SIMVASTATIN 20 MG PO TABS
40.0000 mg | ORAL_TABLET | Freq: Every day | ORAL | Status: DC
  Administered 2021-01-27 – 2021-01-28 (×2): 40 mg via ORAL
  Filled 2021-01-27: qty 2
  Filled 2021-01-27: qty 4

## 2021-01-27 MED ORDER — BUDESONIDE 0.5 MG/2ML IN SUSP
2.0000 mg | Freq: Two times a day (BID) | RESPIRATORY_TRACT | Status: DC
  Administered 2021-01-27 (×2): 2 mg via RESPIRATORY_TRACT
  Administered 2021-01-28: 0.5 mg via RESPIRATORY_TRACT
  Filled 2021-01-27 (×3): qty 8

## 2021-01-27 MED ORDER — SODIUM CHLORIDE 0.9% FLUSH
3.0000 mL | Freq: Two times a day (BID) | INTRAVENOUS | Status: DC
  Administered 2021-01-27 – 2021-01-28 (×3): 3 mL via INTRAVENOUS

## 2021-01-27 MED ORDER — METHYLPREDNISOLONE SODIUM SUCC 125 MG IJ SOLR
125.0000 mg | Freq: Once | INTRAMUSCULAR | Status: AC
  Administered 2021-01-27: 125 mg via INTRAVENOUS
  Filled 2021-01-27: qty 2

## 2021-01-27 MED ORDER — AZITHROMYCIN 500 MG PO TABS
500.0000 mg | ORAL_TABLET | Freq: Once | ORAL | Status: AC
  Administered 2021-01-27: 500 mg via ORAL
  Filled 2021-01-27: qty 1

## 2021-01-27 MED ORDER — ALBUTEROL SULFATE (2.5 MG/3ML) 0.083% IN NEBU
10.0000 mg | INHALATION_SOLUTION | Freq: Once | RESPIRATORY_TRACT | Status: AC
  Administered 2021-01-27: 10 mg via RESPIRATORY_TRACT
  Filled 2021-01-27: qty 12

## 2021-01-27 MED ORDER — IPRATROPIUM-ALBUTEROL 0.5-2.5 (3) MG/3ML IN SOLN
3.0000 mL | Freq: Four times a day (QID) | RESPIRATORY_TRACT | Status: DC
  Administered 2021-01-27 – 2021-01-28 (×5): 3 mL via RESPIRATORY_TRACT
  Filled 2021-01-27 (×5): qty 3

## 2021-01-27 MED ORDER — NICOTINE 14 MG/24HR TD PT24
14.0000 mg | MEDICATED_PATCH | Freq: Every day | TRANSDERMAL | Status: DC
  Administered 2021-01-27: 14 mg via TRANSDERMAL
  Filled 2021-01-27 (×2): qty 1

## 2021-01-27 MED ORDER — LEVOTHYROXINE SODIUM 50 MCG PO TABS
150.0000 ug | ORAL_TABLET | Freq: Every day | ORAL | Status: DC
  Administered 2021-01-27 – 2021-01-28 (×2): 150 ug via ORAL
  Filled 2021-01-27: qty 1
  Filled 2021-01-27: qty 3

## 2021-01-27 MED ORDER — ALBUTEROL SULFATE (2.5 MG/3ML) 0.083% IN NEBU: 2.5000 mg | INHALATION_SOLUTION | RESPIRATORY_TRACT | Status: DC | PRN

## 2021-01-27 MED ORDER — ACETAMINOPHEN 325 MG PO TABS
650.0000 mg | ORAL_TABLET | ORAL | Status: DC | PRN
  Administered 2021-01-27 – 2021-01-28 (×2): 650 mg via ORAL
  Filled 2021-01-27 (×2): qty 2

## 2021-01-27 MED ORDER — GABAPENTIN 300 MG PO CAPS
800.0000 mg | ORAL_CAPSULE | Freq: Four times a day (QID) | ORAL | Status: DC
  Administered 2021-01-27 – 2021-01-28 (×4): 800 mg via ORAL
  Filled 2021-01-27 (×4): qty 2

## 2021-01-27 MED ORDER — DOXYCYCLINE HYCLATE 100 MG PO TABS
100.0000 mg | ORAL_TABLET | Freq: Two times a day (BID) | ORAL | Status: DC
  Administered 2021-01-27 – 2021-01-28 (×3): 100 mg via ORAL
  Filled 2021-01-27 (×3): qty 1

## 2021-01-27 MED ORDER — MIRTAZAPINE 15 MG PO TBDP
30.0000 mg | ORAL_TABLET | Freq: Every day | ORAL | Status: DC
  Administered 2021-01-27: 30 mg via ORAL
  Filled 2021-01-27 (×2): qty 2

## 2021-01-27 NOTE — H&P (Signed)
History and Physical    Caroline Coleman EPP:[id number] DOB: September 09, 1956 DOA: 01/27/2021  PCP: Patient, No Pcp Per   Patient coming from: Home  I have personally briefly reviewed patient's old medical records in [facility]  Chief Complaint: Shortness of breath  HPI: Caroline Coleman is a 65 y.o. female with medical history significant for COPD, nicotine dependence, anxiety, depression who presents to the ER for evaluation of a 3-day history of progressively worsening shortness of breath associated with cough productive of yellow phlegm, wheezing, nasal congestion and chills.  Patient had used her nebulizer at home without any significant improvement in her symptoms. She denies having any sick contacts and is unvaccinated against the COVID-19 virus. She had room air pulse oximetry of 88% by EMS and received 2 breathing treatments en route to the hospital. She denies having any nausea, no vomiting, no chest pain, no fever, no hemoptysis, no abdominal pain, no diarrhea, no constipation, no dizziness, no lightheadedness, no headache, no blurred vision, no palpitations, no diaphoresis, no urinary frequency, no nocturia, no dysuria. Labs show sodium 136, potassium 3.5, chloride 97,, glucose 181, BUN 6, creatinine 0.65, calcium 9.3, alkaline phosphatase 89, albumin 4.3, AST 19, ALT 5, total protein 8.1, 2.4, white count 9.8, hemoglobin 15.2, hematocrit 48 MCV 88.1, RDW 14.6, platelet count 183 Respiratory viral panel is negative Chest x-ray reviewed by me shows clear lungs.  No obvious infiltrate or effusion. Twelve-lead EKG reviewed by me shows sinus tachycardia.     ED Course: Patient is a 65 year old female with a known history of COPD not on home oxygen, history of nicotine dependence who presents to the ER for evaluation of a 3-day history of worsening shortness of breath associated with a productive cough, chills and wheezing.  She had room air pulse oximetry of 88% by EMS and received  2 bronchodilator treatments prior to arriving to the emergency room.  She received a dose of Solu-Medrol 125 mg in the ER.  Patient symptoms improved in the ER after receiving DuoNeb and upon reassessment, she was noted to have post ambulatory pulse ox of 86% associated with tachypnea.  She was placed on 3 L of oxygen via nasal cannula with improvement in her oxygenation.  She will be admitted to the hospital for further evaluation.   Review of Systems: As per HPI otherwise all other systems reviewed and negative.    Past Medical History:  Diagnosis Date  . Anxiety   . COPD (chronic obstructive pulmonary disease) (HCC)   . Depression   . Thyroid disease     Past Surgical History:  Procedure Laterality Date  . EYE SURGERY    . HIP SURGERY       reports that she has been smoking cigarettes. She has been smoking about 1.00 pack per day. She uses smokeless tobacco. She reports that she does not drink alcohol and does not use drugs.  Allergies  Allergen Reactions  . Penicillins Other (See Comments)    Has patient had a PCN reaction causing immediate rash, facial/tongue/throat swelling, SOB or lightheadedness with hypotension: no Has patient had a PCN reaction causing severe rash involving mucus membranes or skin necrosis: no Has patient had a PCN reaction that required hospitalization no  REACTION UNKNOWN Has patient had a PCN reaction occurring within the last 10 years: no If all of the above answers are "NO", then may proceed with Cephalosporin use.    . Sulfa Antibiotics Other (See Comments)    REACTION UNKNOWN  History reviewed. No pertinent family history.    Prior to Admission medications   Medication Sig Start Date End Date Taking? Authorizing Provider  albuterol (VENTOLIN HFA) 108 (90 Base) MCG/ACT inhaler Inhale 2 puffs into the lungs every 6 (six) hours as needed for wheezing or shortness of breath. 07/03/19  Yes Concha Se, MD  gabapentin (NEURONTIN) 400 MG  capsule Take 1 capsule (400 mg total) by mouth 2 (two) times daily. 06/06/17 08/23/18 Yes Schaevitz, Myra Rude, MD  levothyroxine (SYNTHROID) 150 MCG tablet Take 1 tablet (150 mcg total) by mouth daily. 07/27/17 08/23/18 Yes Summers, Rhonda L, PA-C  PARoxetine (PAXIL) 40 MG tablet Take 1 tablet (40 mg total) by mouth daily. 07/27/17 08/23/18 Yes Summers, Rhonda L, PA-C  simvastatin (ZOCOR) 40 MG tablet Take 40 mg by mouth daily. 10/01/20  Yes [provider]  STIOLTO RESPIMAT 2.5-2.5 MCG/ACT AERS Inhale 2 puffs into the lungs daily. 01/26/21  Yes [provider]  guaiFENesin (ROBITUSSIN) 100 MG/5ML SOLN Take 5 mLs (100 mg total) by mouth every 4 (four) hours as needed for cough or to loosen phlegm. Patient not taking: No sig reported 08/26/18   Shaune Pollack, MD  metoCLOPramide (REGLAN) 10 MG tablet Take 1 tablet (10 mg total) by mouth every 6 (six) hours as needed. Patient not taking: Reported on 01/27/2021 06/06/17   Myrna Blazer, MD    Physical Exam: Vitals:   01/27/21 0700 01/27/21 0730 01/27/21 0800 01/27/21 0812  BP:    (!) 93/51  Pulse: 91 85 80 80  Resp: 19 19 (!) 22 20  Temp:      TempSrc:      SpO2: 96% 92% 92% 95%  Weight:      Height:         Vitals:   01/27/21 0700 01/27/21 0730 01/27/21 0800 01/27/21 0812  BP:    (!) 93/51  Pulse: 91 85 80 80  Resp: 19 19 (!) 22 20  Temp:      TempSrc:      SpO2: 96% 92% 92% 95%  Weight:      Height:          Constitutional: Alert and oriented x 3 . Not in any apparent distress HEENT:      Head: Normocephalic and atraumatic.         Eyes: PERLA, EOMI, Conjunctivae are normal. Sclera is non-icteric.       Mouth/Throat: Mucous membranes are moist.       Neck: Supple with no signs of meningismus. Cardiovascular: Regular rate and rhythm. No murmurs, gallops, or rubs. 2+ symmetrical distal pulses are present . No JVD. No LE edema Respiratory: Respiratory effort normal.bilateral air entry both lungs.  Scattered  wheezes, no crackles, or rhonchi.  Gastrointestinal: Soft, non tender, and non distended with positive bowel sounds.  Genitourinary: No CVA tenderness. Musculoskeletal: Nontender with normal range of motion in all extremities. No cyanosis, or erythema of extremities. Neurologic:  Face is symmetric. Moving all extremities. No gross focal neurologic deficits  Skin: Skin is warm, dry.  No rash or ulcers Psychiatric: Mood and affect are normal   Labs on Admission: I have personally reviewed following labs and imaging studies  CBC: Recent Labs  Lab 01/27/21 0146  WBC 9.8  HGB 15.2*  HCT 48.0*  MCV 88.1  PLT 183   Basic Metabolic Panel: Recent Labs  Lab 01/27/21 0146  NA 136  K 3.5  CL 97*  CO2 30  GLUCOSE 181*  BUN 6*  CREATININE 0.65  CALCIUM 9.3   GFR: Estimated Creatinine Clearance: 72.5 mL/min (by C-G formula based on SCr of 0.65 mg/dL). Liver Function Tests: Recent Labs  Lab 01/27/21 0146  AST 19  ALT 12  ALKPHOS 89  BILITOT 0.6  PROT 8.1  ALBUMIN 4.3   No results for input(s): LIPASE, AMYLASE in the last 168 hours. No results for input(s): AMMONIA in the last 168 hours. Coagulation Profile: No results for input(s): INR, PROTIME in the last 168 hours. Cardiac Enzymes: No results for input(s): CKTOTAL, CKMB, CKMBINDEX, TROPONINI in the last 168 hours. BNP (last 3 results) No results for input(s): PROBNP in the last 8760 hours. HbA1C: No results for input(s): HGBA1C in the last 72 hours. CBG: No results for input(s): GLUCAP in the last 168 hours. Lipid Profile: No results for input(s): CHOL, HDL, LDLCALC, TRIG, CHOLHDL, LDLDIRECT in the last 72 hours. Thyroid Function Tests: No results for input(s): TSH, T4TOTAL, FREET4, T3FREE, THYROIDAB in the last 72 hours. Anemia Panel: No results for input(s): VITAMINB12, FOLATE, FERRITIN, TIBC, IRON, RETICCTPCT in the last 72 hours. Urine analysis:    Component Value Date/Time   COLORURINE YELLOW (A) 08/23/2018  2200   APPEARANCEUR CLEAR (A) 08/23/2018 2200   APPEARANCEUR Turbid 01/16/2013 1541   LABSPEC 1.003 (L) 08/23/2018 2200   LABSPEC 1.025 01/16/2013 1541   PHURINE 6.0 08/23/2018 2200   GLUCOSEU NEGATIVE 08/23/2018 2200   GLUCOSEU Negative 01/16/2013 1541   HGBUR SMALL (A) 08/23/2018 2200   BILIRUBINUR NEGATIVE 08/23/2018 2200   BILIRUBINUR Negative 01/16/2013 1541   KETONESUR 5 (A) 08/23/2018 2200   PROTEINUR NEGATIVE 08/23/2018 2200   NITRITE NEGATIVE 08/23/2018 2200   LEUKOCYTESUR NEGATIVE 08/23/2018 2200   LEUKOCYTESUR 3+ 01/16/2013 1541    Radiological Exams on Admission: DG Chest 2 View  Result Date: 01/27/2021 CLINICAL DATA:  Shortness of breath EXAM: CHEST - 2 VIEW COMPARISON:  07/03/2019 FINDINGS: The heart size and mediastinal contours are within normal limits. Both lungs are clear. The visualized skeletal structures are unremarkable. Aortic calcifications are noted. IMPRESSION: No active cardiopulmonary disease. Electronically Signed   By: Katherine Mantlehristopher  Green M.D.   On: 01/27/2021 02:34     Assessment/Plan Principal Problem:   COPD exacerbation (HCC) Active Problems:   Depression   Acute respiratory failure (HCC)   Acquired hypothyroidism   Nicotine dependence     COPD with acute exacerbation Patient with known history of COPD not on home oxygen who presents for evaluation of worsening shortness of breath associated with cough productive of yellow phlegm, wheezing and chills. We will place patient on doxycycline 100 mg p.o. twice daily Place patient on scheduled and as needed bronchodilator therapy Start patient on inhaled steroids Continue oxygen supplementation to maintain pulse oximetry greater than 92%    Acute respiratory failure secondary to acute COPD exacerbation Patient is currently on 3 L of oxygen to maintain pulse oximetry greater than 92% At baseline she does not wear any home oxygen We will attempt to wean off oxygen as tolerated She will need to  be assessed for home oxygen need prior to discharge    Nicotine dependence Smoking cessation has been discussed with patient in detail We will place patient on a nicotine transdermal patch 14 mg daily    Hypothyroidism Continue Synthroid    DVT prophylaxis: Lovenox Code Status: full code Family Communication: Greater than 50% of time was spent discussing patient's condition and plan of care with her at the bedside.  All questions  and concerns have been addressed.  She verbalizes understanding and agrees with the plan. Disposition Plan: Back to previous home environment Consults called: none Status: Inpatient.  The medical decision making for this patient was of high complexity and patient is at high risk for clinical deterioration during this hospitalization.    Lucile Shutters MD [facility]     01/27/2021, 8:32 AM

## 2021-01-27 NOTE — ED Triage Notes (Signed)
Pt to ED via EMS from home c/o SOB that has been worsening last 3 days.  States has had sweats and chills at home, nasal congestion, and productive cough.  EMS states found to be 88% and then given a duoneb and an albuterol with oxygen up to 98% with some relief pt states.  EMS vitals 127/89, 161 CBG, 104 HR.  Pt presents A&Ox4, chest rise even and unlabored, in NAD at this time.

## 2021-01-27 NOTE — ED Notes (Signed)
OT at bedside. 

## 2021-01-27 NOTE — ED Notes (Addendum)
Pt c/o SOB x 2 days. Hx of COPD and sts SOB unimproved by inhaler and nebulizer at home. Pt also with productive cough, sweats, chills. Denies N/V, Covid contacts, leg swelling.

## 2021-01-27 NOTE — Evaluation (Signed)
Occupational Therapy Evaluation Patient Details Name: Caroline Coleman MRN: [id number] DOB: 1956/10/28 Today's Date: 01/27/2021    History of Present Illness 65 y.o. female with medical history significant for COPD, nicotine dependence, anxiety, depression who presents to the ER for evaluation of a 3-day history of progressively worsening shortness of breath associated with cough productive of yellow phlegm, wheezing, nasal congestion and chills.   Clinical Impression   Upon entering the room, pt supine in bed with no c/o pain. Pt removed from 3 L O2 and placed on RA during evaluation. Pt reports living in an apartment with her daughter PTA. Pt does not use AD for functional mobility her and daughter share IADL tasks. Pt reports her daughter is "in and out" during the day. Pt performs bed mobility with supervision and dons shoes from EOB. Pt ambulating to bathroom with supervision without use of AD. Pt managed clothing, hygiene, and standing at sink for grooming items without physical assistance. Pt returning back to bed with O2 saturation at 93%. RN notified and pt left on RA. Pt with no further OT needs at this time with pt being agreeable. OT to SIGN OFF. Thank you for this referral.     Follow Up Recommendations  No OT follow up;Supervision - Intermittent    Equipment Recommendations  None recommended by OT       Precautions / Restrictions Precautions Precautions: Fall Precaution Comments: low risk      Mobility Bed Mobility Overal bed mobility: Needs Assistance Bed Mobility: Supine to Sit;Sit to Supine     Supine to sit: Supervision Sit to supine: Supervision   General bed mobility comments: min cuing for technique from stretcher    Transfers Overall transfer level: Needs assistance Equipment used: None Transfers: Stand Pivot Transfers;Sit to/from Stand Sit to Stand: Supervision Stand pivot transfers: Supervision            Balance Overall balance assessment:  Needs assistance Sitting-balance support: Feet supported Sitting balance-Leahy Scale: Normal       Standing balance-Leahy Scale: Good Standing balance comment: supervision overall with no physical assist needed or use of AD                           ADL either performed or assessed with clinical judgement   ADL Overall ADL's : Needs assistance/impaired     Grooming: Wash/dry hands;Wash/dry face;Standing;Supervision/safety                   Toilet Transfer: Supervision/safety;Ambulation;Regular Toilet   Toileting- Architect and Hygiene: Supervision/safety;Sit to/from stand       Functional mobility during ADLs: Supervision/safety General ADL Comments: no AD utilized with pt being supervision overall for safety     Vision Patient Visual Report: No change from baseline              Pertinent Vitals/Pain Pain Assessment: No/denies pain     Hand Dominance Right   Extremity/Trunk Assessment Upper Extremity Assessment Upper Extremity Assessment: Overall WFL for tasks assessed   Lower Extremity Assessment Lower Extremity Assessment: Overall WFL for tasks assessed       Communication Communication Communication: No difficulties   Cognition Arousal/Alertness: Awake/alert Behavior During Therapy: WFL for tasks assessed/performed Overall Cognitive Status: Within Functional Limits for tasks assessed                    Home Living Family/patient expects to be discharged to:: Private residence Living Arrangements: Children  Available Help at Discharge: Available PRN/intermittently Type of Home: Apartment Home Access: Stairs to enter Entrance Stairs-Number of Steps: 2 Entrance Stairs-Rails: None Home Layout: One level     Bathroom Shower/Tub: Tub/shower unit         Home Equipment: None          Prior Functioning/Environment Level of Independence: Independent                          OT Goals(Current goals  can be found in the care plan section) Acute Rehab OT Goals Patient Stated Goal: to go home OT Goal Formulation: With patient  OT Frequency:                AM-PAC OT "6 Clicks" Daily Activity     Outcome Measure Help from another person eating meals?: None Help from another person taking care of personal grooming?: None Help from another person toileting, which includes using toliet, bedpan, or urinal?: None Help from another person bathing (including washing, rinsing, drying)?: None Help from another person to put on and taking off regular upper body clothing?: None Help from another person to put on and taking off regular lower body clothing?: None 6 Click Score: 24   End of Session Nurse Communication: Other (comment);Mobility status (on RA)  Activity Tolerance: Patient tolerated treatment well Patient left: in bed;with call bell/phone within reach                   Time: 9379-0240 OT Time Calculation (min): 21 min Charges:  OT General Charges $OT Visit: 1 Visit OT Evaluation $OT Eval Low Complexity: 1 Low OT Treatments $Self Care/Home Management : 8-22 mins  Jackquline Denmark, MS, OTR/L , CBIS ascom [phone]  01/27/21, 3:53 PM

## 2021-01-27 NOTE — ED Notes (Signed)
Pharmacy tech called to confirm that patient takes 800mg  of gabapentin 4 times a day.

## 2021-01-27 NOTE — ED Notes (Signed)
Patient calling son at this time.

## 2021-01-27 NOTE — Evaluation (Signed)
Physical Therapy Evaluation Patient Details Name: Caroline Coleman MRN: [id number] DOB: 08/24/1956 Today's Date: 01/27/2021   History of Present Illness  Pt is a 65 y.o. female with medical history significant for COPD, nicotine dependence, anxiety, depression who presents to the ER for evaluation of a 3-day history of progressively worsening shortness of breath associated with cough productive of yellow phlegm, wheezing, nasal congestion and chills.  MD assessment includes: COPD with acute exacerbation and acute respiratory failure.    Clinical Impression  Pt was pleasant and motivated to participate during the session.  Pt reported most pressing concern was related to decreased activity tolerance and more rapid onset of SOB with activity.  Pt required extra time and effort with bed mobility tasks but did not require any physical assistance.  Pt's SpO2 on 2LO2/min was in the mid to high 90s with O2 removed for ambulation.  Pt was able to amb a max of 100 feet and SpO2 dropped to a low of 89% but quickly returned to 93-94% on room air upon sitting.  Pt reported self-limiting activity at home to avoid SOB with extensive education provided on dyspnea spiral, physiological benefits of activity, principles of energy conservation, and activity progression using RPE to guide intensity/duration.  Pt is at risk for further functional decline and will benefit from HHPT services upon discharge to safely address deficits listed in patient problem list for return to PLOF and decreased risk of rapid functional decline.      Follow Up Recommendations Home health PT;Supervision - Intermittent    Equipment Recommendations  None recommended by PT    Recommendations for Other Services       Precautions / Restrictions Precautions Precautions: Fall Precaution Comments: low risk Restrictions Weight Bearing Restrictions: No      Mobility  Bed Mobility Overal bed mobility: Modified Independent Bed  Mobility: Supine to Sit;Sit to Supine     Supine to sit: Supervision Sit to supine: Supervision   General bed mobility comments: Extra time and effort only    Transfers Overall transfer level: Needs assistance Equipment used: None Transfers: Sit to/from Stand Sit to Stand: Supervision Stand pivot transfers: Supervision       General transfer comment: Good eccentric and concentric control and stability  Ambulation/Gait Ambulation/Gait assistance: Supervision Gait Distance (Feet): 100 Feet Assistive device: None Gait Pattern/deviations: Step-through pattern;Decreased step length - right;Decreased step length - left Gait velocity: decreased   General Gait Details: Decreased cadence and B step length but overall steady without LOB; pt ambulated on room air and SpO2 dropped to a low of 89% but returned to 93-94% in sitting  Stairs            Wheelchair Mobility    Modified Rankin (Stroke Patients Only)       Balance Overall balance assessment: Needs assistance Sitting-balance support: Feet supported Sitting balance-Leahy Scale: Normal     Standing balance support: No upper extremity supported Standing balance-Leahy Scale: Good Standing balance comment: Good stability during ambulation without an AD with mild drifting initially but improved significantly during the session                             Pertinent Vitals/Pain Pain Assessment: No/denies pain    Home Living Family/patient expects to be discharged to:: Private residence Living Arrangements: Children Available Help at Discharge: Family;Available 24 hours/day Type of Home: Apartment Home Access: Stairs to enter Entrance Stairs-Rails: None Entrance Stairs-Number of Steps:  1 Home Layout: One level Home Equipment: None      Prior Function Level of Independence: Independent         Comments: Ind Amb without an AD limited community distances, no fall history, Ind with ADLs      Hand Dominance   Dominant Hand: Right    Extremity/Trunk Assessment   Upper Extremity Assessment Upper Extremity Assessment: Overall WFL for tasks assessed    Lower Extremity Assessment Lower Extremity Assessment: Overall WFL for tasks assessed       Communication   Communication: No difficulties  Cognition Arousal/Alertness: Awake/alert Behavior During Therapy: WFL for tasks assessed/performed Overall Cognitive Status: Within Functional Limits for tasks assessed                                        General Comments      Exercises Other Exercises Other Exercises: Extensive pt education provided on dyspnea spiral, RPE, and using RPE to guide activity progression   Assessment/Plan    PT Assessment Patient needs continued PT services  PT Problem List Decreased activity tolerance       PT Treatment Interventions Gait training;Stair training;Functional mobility training;Therapeutic activities;Therapeutic exercise;Balance training;Patient/family education    PT Goals (Current goals can be found in the Care Plan section)  Acute Rehab PT Goals Patient Stated Goal: Improved endurance PT Goal Formulation: With patient Time For Goal Achievement: 02/09/21 Potential to Achieve Goals: Good    Frequency Min 2X/week   Barriers to discharge        Co-evaluation               AM-PAC PT "6 Clicks" Mobility  Outcome Measure Help needed turning from your back to your side while in a flat bed without using bedrails?: None Help needed moving from lying on your back to sitting on the side of a flat bed without using bedrails?: None Help needed moving to and from a bed to a chair (including a wheelchair)?: A Little Help needed standing up from a chair using your arms (e.g., wheelchair or bedside chair)?: A Little Help needed to walk in hospital room?: A Little Help needed climbing 3-5 steps with a railing? : A Little 6 Click Score: 20    End of  Session Equipment Utilized During Treatment: Gait belt Activity Tolerance: Patient tolerated treatment well Patient left: in bed;with call bell/phone within reach;Other (comment) (Bil bed rails up as found in ED), placed on 1LO2/min per nsg request Nurse Communication: Mobility status, SpO2 response to activity on room air PT Visit Diagnosis: Difficulty in walking, not elsewhere classified (R26.2)    Time: 8841-6606 PT Time Calculation (min) (ACUTE ONLY): 32 min   Charges:   PT Evaluation $PT Eval Moderate Complexity: 1 Mod PT Treatments $Therapeutic Activity: 8-22 mins        D. Elly Modena PT, DPT 01/27/21, 4:24 PM

## 2021-01-27 NOTE — ED Notes (Signed)
Breathing tx complete. Lights dimmed for rest.

## 2021-01-27 NOTE — ED Notes (Signed)
Patient resting comfortably at this time. No needs expressed.

## 2021-01-27 NOTE — ED Notes (Signed)
Meal tray placed at bedside.

## 2021-01-27 NOTE — ED Provider Notes (Signed)
[facility] Emergency Department Provider Note  ____________________________________________  Time seen: Approximately 4:00 AM  I have reviewed the triage vital signs and the nursing notes.   HISTORY  Chief Complaint Shortness of Breath   HPI Caroline Coleman is a 65 y.o. female with a history of COPD who presents for evaluation of shortness of breath. Patient reports 3 days of progressively worsening shortness of breath, wheezing, cough productive of yellow phlegm, chills, nasal congestion. No vomiting, no diarrhea, no chest pain, no hemoptysis, no fever. She is not vaccinated against Covid. Denies any known exposures. Has been using her inhalers which helped however this evening the shortness of breath became worse. Patient was found to be satting 88% per EMS and received 2 breathing treatments in route. She denies any personal or family history of blood clots, recent travel immobilization, leg pain or swelling, hemoptysis, pleuritic chest pain, or exogenous hormones. She is not on oxygen at baseline.   Past Medical History:  Diagnosis Date  . Anxiety   . COPD (chronic obstructive pulmonary disease) (HCC)   . Depression   . Thyroid disease     Patient Active Problem List   Diagnosis Date Noted  . Acute respiratory failure (HCC) 08/24/2018  . Self-inflicted laceration of wrist (HCC) 08/03/2016  . Substance induced mood disorder (HCC) 08/03/2016  . Alcohol abuse 08/03/2016  . Panic disorder with agoraphobia 12/14/2015  . Insomnia 12/14/2015  . Depression 12/14/2015  . Acquired hypothyroidism 05/04/2015  . Colon polyps 05/04/2015  . COPD, moderate (HCC) 05/04/2015  . 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 Family history of early CAD 05/04/2015  . Generalized anxiety disorder 05/04/2015  . Goiter 05/04/2015  . Pure hypercholesterolemia 05/04/2015  . S/P lens implant 05/04/2015    Past Surgical History:  Procedure Laterality Date  . EYE SURGERY    . HIP  SURGERY      Prior to Admission medications   Medication Sig Start Date End Date Taking? Authorizing Provider  albuterol (VENTOLIN HFA) 108 (90 Base) MCG/ACT inhaler Inhale 2 puffs into the lungs every 6 (six) hours as needed for wheezing or shortness of breath. 07/03/19   Concha Se, MD  gabapentin (NEURONTIN) 400 MG capsule Take 1 capsule (400 mg total) by mouth 2 (two) times daily. 06/06/17 08/23/18  Myrna Blazer, MD  guaiFENesin (ROBITUSSIN) 100 MG/5ML SOLN Take 5 mLs (100 mg total) by mouth every 4 (four) hours as needed for cough or to loosen phlegm. 08/26/18   Shaune Pollack, MD  levothyroxine (SYNTHROID) 150 MCG tablet Take 1 tablet (150 mcg total) by mouth daily. 07/27/17 08/23/18  Tommi Rumps, PA-C  metoCLOPramide (REGLAN) 10 MG tablet Take 1 tablet (10 mg total) by mouth every 6 (six) hours as needed. Patient not taking: Reported on 08/23/2018 06/06/17   Myrna Blazer, MD  PARoxetine (PAXIL) 40 MG tablet Take 1 tablet (40 mg total) by mouth daily. 07/27/17 08/23/18  Tommi Rumps, PA-C    Allergies Penicillins and Sulfa antibiotics  History reviewed. No pertinent family history.  Social History Social History   Tobacco Use  . Smoking status: Current Every Day Smoker    Packs/day: 1.00    Types: Cigarettes  . Smokeless tobacco: Current User  Vaping Use  . Vaping Use: Never used  Substance Use Topics  . Alcohol use: No  . Drug use: No    Review of Systems  Constitutional: Negative for fever. + chills and body aches Eyes: Negative for visual changes.  ENT: Negative for sore throat. + congestion Neck: No neck pain  Cardiovascular: Negative for chest pain. Respiratory: + shortness of breath, productive cough, wheezing Gastrointestinal: Negative for abdominal pain, vomiting or diarrhea. Genitourinary: Negative for dysuria. Musculoskeletal: Negative for back pain. Skin: Negative for rash. Neurological: Negative for headaches, weakness or  numbness. Psych: No SI or HI  ____________________________________________   PHYSICAL EXAM:  VITAL SIGNS: Vitals:   01/27/21 0330 01/27/21 0430  BP: 129/79 (!) 99/53  Pulse: 95 86  Resp: (!) 31 19  Temp:    SpO2: 94% 98%     Constitutional: Alert and oriented. Well appearing and in no apparent distress. HEENT:      Head: Normocephalic and atraumatic.         Eyes: Conjunctivae are normal. Sclera is non-icteric.       Mouth/Throat: Mucous membranes are moist.       Neck: Supple with no signs of meningismus. Cardiovascular: Regular rate and rhythm. No murmurs, gallops, or rubs. 2+ symmetrical distal pulses are present in all extremities. No JVD. Respiratory: Normal respiratory effort. Hypoxic to mid 80s on room air which improved on 2L [place], diffuse wheezing bilaterally Gastrointestinal: Soft, non tender, and non distended with positive bowel sounds. No rebound or guarding. Musculoskeletal:  No edema, cyanosis, or erythema of extremities. Neurologic: Normal speech and language. Face is symmetric. Moving all extremities. No gross focal neurologic deficits are appreciated. Skin: Skin is warm, dry and intact. No rash noted. Psychiatric: Mood and affect are normal. Speech and behavior are normal.  ____________________________________________   LABS (all labs ordered are listed, but only abnormal results are displayed)  Labs Reviewed  CBC - Abnormal; Notable for the following components:      Result Value   RBC 5.45 (*)    Hemoglobin 15.2 (*)    HCT 48.0 (*)    All other components within normal limits  COMPREHENSIVE METABOLIC PANEL - Abnormal; Notable for the following components:   Chloride 97 (*)    Glucose, Bld 181 (*)    BUN 6 (*)    All other components within normal limits  RESP PANEL BY RT-PCR (FLU A&B, COVID) [id number]  TROPONIN I (HIGH SENSITIVITY)  TROPONIN I (HIGH SENSITIVITY)   ____________________________________________  EKG  ED ECG REPORT I, Nita Sickle, the attending physician, personally viewed and interpreted this ECG.  Sinus tachycardia, rate of 110, normal intervals, normal axis, no ST elevations or depressions ____________________________________________  RADIOLOGY  I have personally reviewed the images performed during this visit and I agree with the Radiologist's read.   Interpretation by Radiologist:  DG Chest 2 View  Result Date: 01/27/2021 CLINICAL DATA:  Shortness of breath EXAM: CHEST - 2 VIEW COMPARISON:  07/03/2019 FINDINGS: The heart size and mediastinal contours are within normal limits. Both lungs are clear. The visualized skeletal structures are unremarkable. Aortic calcifications are noted. IMPRESSION: No active cardiopulmonary disease. Electronically Signed   By: Katherine Mantle M.D.   On: 01/27/2021 02:34     ____________________________________________   PROCEDURES  Procedure(s) performed:yes .1-3 Lead EKG Interpretation Performed by: Nita Sickle, MD Authorized by: Nita Sickle, MD     Interpretation: non-specific     ECG rate assessment: normal     Rhythm: sinus rhythm     Ectopy: none     Conduction: normal     Critical Care performed: yes  CRITICAL CARE Performed by: Nita Sickle  ?  Total critical care time: 40 min  Critical care time was exclusive  of separately billable procedures and treating other patients.  Critical care was necessary to treat or prevent imminent or life-threatening deterioration.  Critical care was time spent personally by me on the following activities: development of treatment plan with patient and/or surrogate as well as nursing, discussions with consultants, evaluation of patient's response to treatment, examination of patient, obtaining history from patient or surrogate, ordering and performing treatments and interventions, ordering and review of laboratory studies, ordering and review of radiographic studies, pulse oximetry and  re-evaluation of patient's condition.  ____________________________________________   INITIAL IMPRESSION / ASSESSMENT AND PLAN / ED COURSE  65 y.o. female with a history of COPD who presents for evaluation of shortness of breath, wheezing, productive cough, congestion, chills x3 days. Hypoxic per EMS currently on 2 L nasal cannula with diffuse wheezing bilaterally but no significant respiratory distress.  Chest x-ray visualized by me with no acute findings, confirmed by radiology. EKG is nonischemic.  Differential diagnosis including bronchitis versus COPD exacerbation versus pneumonia versus Covid versus flu. Doubt PE  Labs with no leukocytosis, troponin negative, no significant electrolyte derangements. Will treat with 3 duo nebs, steroids, Z-Pak, and reassess. Patient placed on telemetry for close monitoring. Old medical records reviewed.  _________________________ 4:59 AM on 01/27/2021 -----------------------------------------  Covid and flu negative.  2 high-sensitivity troponins negative.  After 2 DuoNeb's per EMS and 3 here patient reassessed.  With ambulation patient desatted to 86% and respiratory rate went to mid 30s.  Oxygenation improved on 3 L nasal cannula.  Clear lung sounds on the right but is still some wheezing on the left.  Will put on 10 mg of albuterol continuous for an hour and discussed with the hospitalist for admission.      _____________________________________________ Please note:  Patient was evaluated in Emergency Department today for the symptoms described in the history of present illness. Patient was evaluated in the context of the global COVID-19 pandemic, which necessitated consideration that the patient might be at risk for infection with the SARS-CoV-2 virus that causes COVID-19. Institutional protocols and algorithms that pertain to the evaluation of patients at risk for COVID-19 are in a state of rapid change based on information released by regulatory  bodies including the CDC and federal and state organizations. These policies and algorithms were followed during the patient's care in the ED.  Some ED evaluations and interventions may be delayed as a result of limited staffing during the pandemic.   [place] Controlled Substance Database was reviewed by me. ____________________________________________   FINAL CLINICAL IMPRESSION(S) / ED DIAGNOSES   Final diagnoses:  Acute respiratory failure with hypoxia (HCC)  Bronchitis      NEW MEDICATIONS STARTED DURING THIS VISIT:  ED Discharge Orders    None       Note:  This document was prepared using Dragon voice recognition software and may include unintentional dictation errors.    Don Perking, [place], MD 01/27/21 0500

## 2021-01-27 NOTE — ED Notes (Signed)
Pt given breakfast tray

## 2021-01-27 NOTE — ED Notes (Signed)
Breathing tx complete. Pt sts SOB improved as well as wheezing.  O2 decreased to 2L [place].

## 2021-01-27 NOTE — ED Notes (Signed)
Patient given cup of coffee

## 2021-01-27 NOTE — ED Notes (Signed)
PT at bedside.

## 2021-01-27 NOTE — ED Notes (Signed)
Admission MD at bedside.  

## 2021-01-28 DIAGNOSIS — J9601 Acute respiratory failure with hypoxia: Principal | ICD-10-CM

## 2021-01-28 DIAGNOSIS — F32A Depression, unspecified: Secondary | ICD-10-CM

## 2021-01-28 DIAGNOSIS — F17213 Nicotine dependence, cigarettes, with withdrawal: Secondary | ICD-10-CM | POA: Diagnosis not present

## 2021-01-28 DIAGNOSIS — F419 Anxiety disorder, unspecified: Secondary | ICD-10-CM

## 2021-01-28 DIAGNOSIS — J441 Chronic obstructive pulmonary disease with (acute) exacerbation: Secondary | ICD-10-CM | POA: Diagnosis not present

## 2021-01-28 DIAGNOSIS — Z9119 Patient's noncompliance with other medical treatment and regimen: Secondary | ICD-10-CM

## 2021-01-28 LAB — BASIC METABOLIC PANEL
Anion gap: 10 (ref 5–15)
BUN: 13 mg/dL (ref 8–23)
CO2: 31 mmol/L (ref 22–32)
Calcium: 9.4 mg/dL (ref 8.9–10.3)
Chloride: 97 mmol/L — ABNORMAL LOW (ref 98–111)
Creatinine, Ser: 0.61 mg/dL (ref 0.44–1.00)
GFR, Estimated: >60 mL/min (ref >60)
Glucose, Bld: 106 mg/dL — ABNORMAL HIGH (ref 70–99)
Potassium: 3.9 mmol/L (ref 3.5–5.1)
Sodium: 138 mmol/L (ref 135–145)

## 2021-01-28 LAB — CBC
HCT: 44.4 % (ref 36.0–46.0)
Hemoglobin: 14.2 g/dL (ref 12.0–15.0)
MCH: 28 pg (ref 26.0–34.0)
MCHC: 32 g/dL (ref 30.0–36.0)
MCV: 87.4 fL (ref 80.0–100.0)
Platelets: 181 10*3/uL (ref 150–400)
RBC: 5.08 MIL/uL (ref 3.87–5.11)
RDW: 14.5 % (ref 11.5–15.5)
WBC: 10.9 10*3/uL — ABNORMAL HIGH (ref 4.0–10.5)
nRBC: 0 % (ref 0.0–0.2)

## 2021-01-28 LAB — MAGNESIUM: Magnesium: 2.2 mg/dL (ref 1.7–2.4)

## 2021-01-28 MED ORDER — ANORO ELLIPTA 62.5-25 MCG/INH IN AEPB: 1.0000 | INHALATION_SPRAY | Freq: Every day | RESPIRATORY_TRACT | 1 refills | Status: DC

## 2021-01-28 MED ORDER — PREDNISONE 50 MG PO TABS: 50.0000 mg | ORAL_TABLET | Freq: Every day | ORAL | 0 refills | Status: DC

## 2021-01-28 MED ORDER — PREDNISONE 50 MG PO TABS: 50.0000 mg | ORAL_TABLET | Freq: Every day | ORAL | Status: DC

## 2021-01-28 MED ORDER — METHYLPREDNISOLONE SODIUM SUCC 40 MG IJ SOLR: 40.0000 mg | Freq: Two times a day (BID) | INTRAMUSCULAR | Status: DC

## 2021-01-28 MED ORDER — DOXYCYCLINE HYCLATE 100 MG PO TABS: 100.0000 mg | ORAL_TABLET | Freq: Two times a day (BID) | ORAL | 0 refills | Status: DC

## 2021-01-28 MED ORDER — POTASSIUM CHLORIDE 20 MEQ PO PACK
20.0000 meq | PACK | Freq: Once | ORAL | Status: AC
  Administered 2021-01-28: 20 meq via ORAL
  Filled 2021-01-28: qty 1

## 2021-01-28 NOTE — Discharge Summary (Signed)
Physician Discharge Summary  Caroline Coleman GEX:[id number] DOB: 07/24/1956 DOA: 01/27/2021  PCP: Patient, No Pcp Per  Admit date: 01/27/2021 Discharge date: 01/28/2021  Admitted From: Home Disposition: Home  Recommendations for Outpatient Follow-up:  1. Follow ups as below. 2. Please obtain CBC/BMP/Mag at follow up 3. Encourage tobacco cessation and compliance with medication 4. Please follow up on the following pending results: None  Home Health: None Equipment/Devices: None  Discharge Condition: Stable CODE STATUS: Full code    Hospital Course: 65 year old F with PMH of COPD not on oxygen, noncompliance, tobacco use disorder, anxiety and depression presenting with increased shortness of breath, productive cough with yellowish phlegm, wheeze and URI symptoms.  She was hypoxic to 88% when EMS arrived.  Also desaturated to 86% with ambulation on room air, requiring 3 L to recover.  COVID-19 and influenza a and B PCR nonreactive.  Basic labs and CXR without acute finding.  She was admitted for COPD exacerbation.  Started on systemic steroid, antibiotics and nebulizers.  The next day, patient felt better and ready to go home.  She was ambulated on room air and maintained saturation greater than 89% without significant distress.  She is discharged on p.o. prednisone 50 mg daily and doxycycline 100 mg twice daily for 4 more days.  Also started on Anoro Ellipta.  She was counseled on tobacco cessation and given resources.   See individual problem list below for more on hospital course.  Discharge Diagnoses:  Acute hypoxic respiratory failure due to COPD exacerbation-improved. -Discharged on prednisone, doxycycline, Anoro Ellipta and as needed albuterol as above -Emphasized importance of tobacco cessation  Tobacco use disorder -Counseled -Resources provided.  Anxiety/depression: Stable  Hypothyroidism: Stable  Noncompliance-supposed to be on Respimat but does not seems to be  taking. -Counseled.  Unvaccinated against the COVID-19 virus. -Declined.  Body mass index is 26.34 kg/m.            Discharge Exam: Vitals:   01/28/21 0756 01/28/21 0837  BP:  98/69  Pulse:  72  Resp:  19  Temp:  98 F (36.7 C)  SpO2: 98% 93%    GENERAL: No apparent distress.  Nontoxic. HEENT: MMM.  Vision and hearing grossly intact.  NECK: Supple.  No apparent JVD.  RESP: On room air.  No IWOB.  Fair aeration with rhonchi bilaterally. CVS:  RRR. Heart sounds normal.  ABD/GI/GU: Bowel sounds present. Soft. Non tender.  MSK/EXT:  Moves extremities. No apparent deformity. No edema.  SKIN: no apparent skin lesion or wound NEURO: Awake, alert and oriented appropriately.  No apparent focal neuro deficit. PSYCH: Calm. Normal affect.  Discharge Instructions  Discharge Instructions    Call MD for:  difficulty breathing, headache or visual disturbances   Complete by: As directed    Call MD for:  extreme fatigue   Complete by: As directed    Diet general   Complete by: As directed    Discharge instructions   Complete by: As directed    It has been a pleasure taking care of you!  You were hospitalized due to COPD exacerbation.  Your symptoms improved with treatment.  We are discharging you on more medications to complete treatment course for exacerbation.  We have also started your new medication that you need to continue taking regardless of his symptoms.  We strongly recommend you quit smoking cigarettes.  Please follow-up with your primary care doctor in 1 to 2 weeks, or establish care as soon as possible if you  do not have one.  It is important that you quit smoking cigarettes.  You may use nicotine patch to help you quit smoking.  Nicotine patch is available over-the-counter.  You may also discuss other options to help you quit smoking with your primary care doctor. You can also talk to professional counselors at 1-800-QUIT-NOW 919-108-8047) for free smoking cessation  counseling.     Take care,   Increase activity slowly   Complete by: As directed      Allergies as of 01/28/2021      Reactions   Penicillins Other (See Comments)   Has patient had a PCN reaction causing immediate rash, facial/tongue/throat swelling, SOB or lightheadedness with hypotension:   Sulfa Antibiotics Other (See Comments)   REACTION UNKNOWN      Medication List    STOP taking these medications   guaiFENesin 100 MG/5ML Soln Commonly known as: ROBITUSSIN   metoCLOPramide 10 MG tablet Commonly known as: REGLAN   Stiolto Respimat 2.5-2.5 MCG/ACT Aers Generic drug: Tiotropium Bromide-Olodaterol     TAKE these medications   albuterol 108 (90 Base) MCG/ACT inhaler Commonly known as: VENTOLIN HFA Inhale 2 puffs into the lungs every 6 (six) hours as needed for wheezing or shortness of breath.   Anoro Ellipta 62.5-25 MCG/INH Aepb Generic drug: umeclidinium-vilanterol Inhale 1 puff into the lungs daily.   doxycycline 100 MG tablet Commonly known as: VIBRA-TABS Take 1 tablet (100 mg total) by mouth every 12 (twelve) hours.   gabapentin 400 MG capsule Commonly known as: Neurontin Take 1 capsule (400 mg total) by mouth 2 (two) times daily. What changed:   how much to take  when to take this   levothyroxine 150 MCG tablet Commonly known as: Synthroid Take 1 tablet (150 mcg total) by mouth daily.   PARoxetine 40 MG tablet Commonly known as: Paxil Take 1 tablet (40 mg total) by mouth daily.   predniSONE 50 MG tablet Commonly known as: DELTASONE Take 1 tablet (50 mg total) by mouth daily with breakfast.   simvastatin 40 MG tablet Commonly known as: ZOCOR Take 40 mg by mouth daily.       Consultations:  None  Procedures/Studies:   DG Chest 2 View  Result Date: 01/27/2021 CLINICAL DATA:  Shortness of breath EXAM: CHEST - 2 VIEW COMPARISON:  07/03/2019 FINDINGS: The heart size and mediastinal contours are within normal limits. Both lungs are clear. The  visualized skeletal structures are unremarkable. Aortic calcifications are noted. IMPRESSION: No active cardiopulmonary disease. Electronically Signed   By: Katherine Mantle M.D.   On: 01/27/2021 02:34        The results of significant diagnostics from this hospitalization (including imaging, microbiology, ancillary and laboratory) are listed below for reference.     Microbiology: Recent Results (from the past 240 hour(s))  Resp Panel by RT-PCR (Flu A&B, Covid) Nasopharyngeal Swab     Status: None   Collection Time: 01/27/21  3:52 AM   Specimen: Nasopharyngeal Swab; Nasopharyngeal(NP) swabs in vial transport medium  Result Value Ref Range Status   SARS Coronavirus 2 by RT PCR NEGATIVE NEGATIVE Final    Comment: (NOTE) SARS-CoV-2 target nucleic acids are NOT DETECTED.  The SARS-CoV-2 RNA is generally detectable in upper respiratory specimens during the acute phase of infection. The lowest concentration of SARS-CoV-2 viral copies this assay can detect is 138 copies/mL. A negative result does not preclude SARS-Cov-2 infection and should not be used as the sole basis for treatment or other patient management decisions. A  negative result may occur with  improper specimen collection/handling, submission of specimen other than nasopharyngeal swab, presence of viral mutation(s) within the areas targeted by this assay, and inadequate number of viral copies(<138 copies/mL). A negative result must be combined with clinical observations, patient history, and epidemiological information. The expected result is Negative.  Fact Sheet for Patients:  BloggerCourse.com  Fact Sheet for Healthcare Providers:  SeriousBroker.it  This test is no t yet approved or cleared by the [place] FDA and  has been authorized for detection and/or diagnosis of SARS-CoV-2 by FDA under an Emergency Use Authorization (EUA). This EUA will remain  in effect  (meaning this test can be used) for the duration of the COVID-19 declaration under Section 564(b)(1) of the Act, 21 U.S.C.section 360bbb-3(b)(1), unless the authorization is terminated  or revoked sooner.       Influenza A by PCR NEGATIVE NEGATIVE Final   Influenza B by PCR NEGATIVE NEGATIVE Final    Comment: (NOTE) The Xpert Xpress SARS-CoV-2/FLU/RSV plus assay is intended as an aid in the diagnosis of influenza from Nasopharyngeal swab specimens and should not be used as a sole basis for treatment. Nasal washings and aspirates are unacceptable for Xpert Xpress SARS-CoV-2/FLU/RSV testing.  Fact Sheet for Patients: BloggerCourse.com  Fact Sheet for Healthcare Providers: SeriousBroker.it  This test is not yet approved or cleared by the [place] FDA and has been authorized for detection and/or diagnosis of SARS-CoV-2 by FDA under an Emergency Use Authorization (EUA). This EUA will remain in effect (meaning this test can be used) for the duration of the COVID-19 declaration under Section 564(b)(1) of the Act, 21 U.S.C. section 360bbb-3(b)(1), unless the authorization is terminated or revoked.  Performed at [facility], [address] [place], [place] [address]      Labs:  CBC: Recent Labs  Lab 01/27/21 0146 01/28/21 0503  WBC 9.8 10.9*  HGB 15.2* 14.2  HCT 48.0* 44.4  MCV 88.1 87.4  PLT 183 181   BMP &GFR Recent Labs  Lab 01/27/21 0146 01/28/21 0503  NA 136 138  K 3.5 3.9  CL 97* 97*  CO2 30 31  GLUCOSE 181* 106*  BUN 6* 13  CREATININE 0.65 0.61  CALCIUM 9.3 9.4  MG  --  2.2   Estimated Creatinine Clearance: 68.1 mL/min (by C-G formula based on SCr of 0.61 mg/dL). Liver & Pancreas: Recent Labs  Lab 01/27/21 0146  AST 19  ALT 12  ALKPHOS 89  BILITOT 0.6  PROT 8.1  ALBUMIN 4.3   No results for input(s): LIPASE, AMYLASE in the last 168 hours. No results for input(s): AMMONIA in the  last 168 hours. Diabetic: No results for input(s): HGBA1C in the last 72 hours. No results for input(s): GLUCAP in the last 168 hours. Cardiac Enzymes: No results for input(s): CKTOTAL, CKMB, CKMBINDEX, TROPONINI in the last 168 hours. No results for input(s): PROBNP in the last 8760 hours. Coagulation Profile: No results for input(s): INR, PROTIME in the last 168 hours. Thyroid Function Tests: No results for input(s): TSH, T4TOTAL, FREET4, T3FREE, THYROIDAB in the last 72 hours. Lipid Profile: No results for input(s): CHOL, HDL, LDLCALC, TRIG, CHOLHDL, LDLDIRECT in the last 72 hours. Anemia Panel: No results for input(s): VITAMINB12, FOLATE, FERRITIN, TIBC, IRON, RETICCTPCT in the last 72 hours. Urine analysis:    Component Value Date/Time   COLORURINE YELLOW (A) 08/23/2018 2200   APPEARANCEUR CLEAR (A) 08/23/2018 2200   APPEARANCEUR Turbid 01/16/2013 1541   LABSPEC 1.003 (L)  08/23/2018 2200   LABSPEC 1.025 01/16/2013 1541   PHURINE 6.0 08/23/2018 2200   GLUCOSEU NEGATIVE 08/23/2018 2200   GLUCOSEU Negative 01/16/2013 1541   HGBUR SMALL (A) 08/23/2018 2200   BILIRUBINUR NEGATIVE 08/23/2018 2200   BILIRUBINUR Negative 01/16/2013 1541   KETONESUR 5 (A) 08/23/2018 2200   PROTEINUR NEGATIVE 08/23/2018 2200   NITRITE NEGATIVE 08/23/2018 2200   LEUKOCYTESUR NEGATIVE 08/23/2018 2200   LEUKOCYTESUR 3+ 01/16/2013 1541   Sepsis Labs: Invalid input(s): PROCALCITONIN, LACTICIDVEN   Time coordinating discharge: 35 minutes  SIGNED:  Almon Hercules, MD  [facility] 01/28/2021, 10:56 AM  If 7PM-7AM, please contact night-coverage [email or web address]

## 2021-01-28 NOTE — TOC Progression Note (Signed)
Transition of Care [facility]) - Progression Note    Patient Details  Name: SHAQUERA ANSLEY MRN: [id number] Date of Birth: 1956-10-22  [facility] CM/SW Contact  Allayne Butcher, RN Phone Number: 01/28/2021, 3:34 PM  Clinical Narrative:    Patient provided with taxi voucher.   Expected Discharge Plan: Home/Self Care Barriers to Discharge: No Barriers Identified  Expected Discharge Plan and Services Expected Discharge Plan: Home/Self Care   Discharge Planning Services: CM Consult   Living arrangements for the past 2 months: Apartment Expected Discharge Date: 01/28/21               DME Arranged: N/A DME Agency: NA       HH Arranged: Patient Refused HH HH Agency: NA         Social Determinants of Health (SDOH) Interventions    Readmission Risk Interventions No flowsheet data found.

## 2021-01-28 NOTE — TOC Initial Note (Signed)
Transition of Care [facility]) - Initial/Assessment Note    Patient Details  Name: Caroline Coleman MRN: [id number] Date of Birth: [birth year]  Transition of Care [facility]) CM/SW Contact:    Shelbie Hutching, RN Phone Number: 01/28/2021, 11:28 AM  Clinical Narrative:                 Patient admitted for COPD exacerbation.  Patient initially required acute oxygen but has been weaned down to room air and does not qualify for home oxygen.  RNCM met with patient at the bedside.  Patient lives with her daughter and is independent at home.  Daughter provides transportation.  Patient reports that her PCP retired so she has been going to the [place] clinic, she also gets her prescriptions from them.  Patient declines home health services.  Patient reports that she has transportation home.   Expected Discharge Plan: Home/Self Care Barriers to Discharge: No Barriers Identified   Patient Goals and CMS Choice Patient states their goals for this hospitalization and ongoing recovery are:: Ready to go home      Expected Discharge Plan and Services Expected Discharge Plan: Home/Self Care   Discharge Planning Services: CM Consult   Living arrangements for the past 2 months: Apartment Expected Discharge Date: 01/28/21               DME Arranged: N/A DME Agency: NA       HH Arranged: Patient Refused [place] Agency: NA        Prior Living Arrangements/Services Living arrangements for the past 2 months: Apartment Lives with:: Adult Children Patient language and need for interpreter reviewed:: Yes Do you feel safe going back to the place where you live?: Yes      Need for Family Participation in Patient Care: Yes (Comment) Care giver support system in place?: Yes (comment) (daughter)   Criminal Activity/Legal Involvement Pertinent to Current Situation/Hospitalization: No - Comment as needed  Activities of Daily Living Home Assistive Devices/Equipment: None ADL Screening (condition at time of  admission) Patient's cognitive ability adequate to safely complete daily activities?: Yes Is the patient deaf or have difficulty hearing?: No Does the patient have difficulty seeing, even when wearing glasses/contacts?: No Does the patient have difficulty concentrating, remembering, or making decisions?: No Patient able to express need for assistance with ADLs?: No Does the patient have difficulty dressing or bathing?: No Independently performs ADLs?: Yes (appropriate for developmental age) Does the patient have difficulty walking or climbing stairs?: No Weakness of Legs: None Weakness of Arms/Hands: None  Permission Sought/Granted   Permission granted to share information with : No              Emotional Assessment Appearance:: Appears stated age Attitude/Demeanor/Rapport: Engaged Affect (typically observed): Accepting Orientation: : Oriented to Self,Oriented to Place,Oriented to  Time,Oriented to Situation Alcohol / Substance Use: Not Applicable Psych Involvement: No (comment)  Admission diagnosis:  Bronchitis [J40] COPD exacerbation ([place]) [J44.1] Acute respiratory failure with hypoxia ([place]) [J96.01] Patient Active Problem List   Diagnosis Date Noted  . COPD exacerbation ([place]) 01/27/2021  . Nicotine dependence 01/27/2021  . Acute respiratory failure ([place]) 08/24/2018  . Self-inflicted laceration of wrist ([place]) 08/03/2016  . Substance induced mood disorder ([place]) 08/03/2016  . Alcohol abuse 08/03/2016  . Panic disorder with agoraphobia 12/14/2015  . Insomnia 12/14/2015  . Depression 12/14/2015  . Acquired hypothyroidism 05/04/2015  . Colon polyps 05/04/2015  . COPD, moderate ([place]) 05/04/2015  . 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 Family history of  early CAD 05/04/2015  . Generalized anxiety disorder 05/04/2015  . Goiter 05/04/2015  . Pure hypercholesterolemia 05/04/2015  . S/P lens implant 05/04/2015   PCP:  Patient, No Pcp Per Pharmacy:   [place], [address] [place] [address] Phone: [phone] Fax: [phone]     Social Determinants of Health (SDOH) Interventions    Readmission Risk Interventions No flowsheet data found.

## 2021-01-28 NOTE — Progress Notes (Signed)
Pt states she will not wait in room for cab, she will wait in medical mall for cab.

## 2021-01-28 NOTE — Progress Notes (Signed)
Nutrition Brief Note  RD received consult for assessment of nutrition requirements/status per COPD protocol  Wt Readings from Last 15 Encounters:  01/27/21 69.6 kg  07/03/19 77.1 kg  08/26/18 82.7 kg  07/27/17 77.1 kg  06/05/17 72.6 kg  03/26/17 72.6 kg  02/04/17 72.6 kg  12/18/16 72.6 kg  09/21/16 70.3 kg  08/03/16 69.9 kg  07/17/16 70.3 kg  04/07/16 72.6 kg  12/13/15 77.1 kg  11/01/15 74.8 kg  10/15/15 77.7 kg   65 year old female with PMHx of COPD, nicotine dependence, anxiety, depression admitted with acute exacerbation of COPD.  Met with patient at bedside. She reports her appetite is good now and at baseline. She was eating breakfast at time of RD assessment and had finished most of the meal already. She reports eating 100% of meals. Patient reports her UBW is 150-160 lbs and that she is weight-stable. Denies any nutritional needs. Completed Nutrition-Focused Physical Exam and only found mild muscle depletion in clavicle and acromion bone region. Patient does not meet criteria for malnutrition at this time. Encouraged ongoing intake of adequate calories and protein due to catabolic nature of COPD. Patient reports plan is for likely discharge home today.  Body mass index is 26.34 kg/m. Patient meets criteria for overweight based on current BMI.   Current diet order is 2 gram sodium, patient is consuming approximately 100% of meals at this time. Labs and medications reviewed.   No nutrition interventions warranted at this time. If nutrition issues arise, please consult RD.   Jacklynn Barnacle, MS, RD, LDN Pager number available on Amion

## 2021-01-28 NOTE — Progress Notes (Signed)
SATURATION QUALIFICATIONS: (This note is used to comply with regulatory documentation for home oxygen)  Patient Saturations on Room Air at Rest = 97%  Patient Saturations on Room Air while Ambulating = 89%   

## 2021-01-28 NOTE — Progress Notes (Signed)
Pt just had a 10 beat run of Vtach on the monitor. Pt seen, coughing. VSS. Denies any chest pain or palpitation. MD notified via chat.

## 2021-03-18 ENCOUNTER — Telehealth: Payer: Self-pay | Admitting: *Deleted

## 2021-03-18 NOTE — Telephone Encounter (Signed)
Received referral for low dose lung cancer screening CT scan. Message left at phone number listed in EMR for patient to call me back to facilitate scheduling scan.  

## 2021-03-19 ENCOUNTER — Telehealth: Payer: Self-pay | Admitting: *Deleted

## 2021-03-19 ENCOUNTER — Encounter: Payer: Self-pay | Admitting: *Deleted

## 2021-03-19 DIAGNOSIS — F17200 Nicotine dependence, unspecified, uncomplicated: Secondary | ICD-10-CM

## 2021-03-19 DIAGNOSIS — Z122 Encounter for screening for malignant neoplasm of respiratory organs: Secondary | ICD-10-CM

## 2021-03-19 DIAGNOSIS — Z87891 Personal history of nicotine dependence: Secondary | ICD-10-CM

## 2021-03-19 NOTE — Telephone Encounter (Signed)
Received referral for low dose lung cancer screening CT scan. Message left at phone number listed in EMR for patient to call me back to facilitate scheduling scan.  

## 2021-03-19 NOTE — Telephone Encounter (Signed)
Patient returned my call re: scheduling lung screening scan. Received referral for initial lung cancer screening scan. Obtained smoking history,(current every day smoker, 1 ppd x 45 yrs) as well as answering questions related to screening process. Patient denies signs of lung cancer such as weight loss or hemoptysis. Patient denies comorbidity that would prevent curative treatment if lung cancer were found. Patient is scheduled for shared decision making visit and CT scan on 04/13/21 @ 1:00 pm.

## 2021-03-23 ENCOUNTER — Other Ambulatory Visit: Payer: Self-pay | Admitting: Family Medicine

## 2021-03-23 DIAGNOSIS — E041 Nontoxic single thyroid nodule: Secondary | ICD-10-CM

## 2021-04-01 ENCOUNTER — Encounter: Payer: Self-pay | Admitting: Family Medicine

## 2021-04-13 ENCOUNTER — Inpatient Hospital Stay: Payer: Medicaid Other | Admitting: Hospice and Palliative Medicine

## 2021-04-13 ENCOUNTER — Ambulatory Visit: Admission: RE | Admit: 2021-04-13 | Payer: Medicaid Other | Source: Ambulatory Visit

## 2021-06-25 ENCOUNTER — Other Ambulatory Visit: Payer: Self-pay

## 2021-06-25 ENCOUNTER — Emergency Department: Payer: Medicaid Other

## 2021-06-25 ENCOUNTER — Inpatient Hospital Stay
Admission: EM | Admit: 2021-06-25 | Discharge: 2021-06-27 | DRG: 193 | Disposition: A | Discharge status: Home / Self Care | Payer: Medicaid Other | Attending: Internal Medicine | Admitting: Internal Medicine

## 2021-06-25 DIAGNOSIS — Z20822 Contact with and (suspected) exposure to covid-19: Secondary | ICD-10-CM | POA: Diagnosis present

## 2021-06-25 DIAGNOSIS — F17210 Nicotine dependence, cigarettes, uncomplicated: Secondary | ICD-10-CM | POA: Diagnosis present

## 2021-06-25 DIAGNOSIS — J440 Chronic obstructive pulmonary disease with acute lower respiratory infection: Secondary | ICD-10-CM | POA: Diagnosis present

## 2021-06-25 DIAGNOSIS — E785 Hyperlipidemia, unspecified: Secondary | ICD-10-CM | POA: Diagnosis present

## 2021-06-25 DIAGNOSIS — Z720 Tobacco use: Secondary | ICD-10-CM | POA: Diagnosis not present

## 2021-06-25 DIAGNOSIS — J441 Chronic obstructive pulmonary disease with (acute) exacerbation: Secondary | ICD-10-CM | POA: Diagnosis present

## 2021-06-25 DIAGNOSIS — Z79899 Other long term (current) drug therapy: Secondary | ICD-10-CM

## 2021-06-25 DIAGNOSIS — J189 Pneumonia, unspecified organism: Secondary | ICD-10-CM | POA: Diagnosis present

## 2021-06-25 DIAGNOSIS — F1010 Alcohol abuse, uncomplicated: Secondary | ICD-10-CM

## 2021-06-25 DIAGNOSIS — Z7951 Long term (current) use of inhaled steroids: Secondary | ICD-10-CM

## 2021-06-25 DIAGNOSIS — Z803 Family history of malignant neoplasm of breast: Secondary | ICD-10-CM | POA: Diagnosis not present

## 2021-06-25 DIAGNOSIS — Z882 Allergy status to sulfonamides status: Secondary | ICD-10-CM

## 2021-06-25 DIAGNOSIS — Z880 Allergy status to penicillin: Secondary | ICD-10-CM | POA: Diagnosis not present

## 2021-06-25 DIAGNOSIS — F418 Other specified anxiety disorders: Secondary | ICD-10-CM | POA: Diagnosis not present

## 2021-06-25 DIAGNOSIS — J9601 Acute respiratory failure with hypoxia: Secondary | ICD-10-CM | POA: Diagnosis present

## 2021-06-25 DIAGNOSIS — F32A Depression, unspecified: Secondary | ICD-10-CM | POA: Diagnosis present

## 2021-06-25 DIAGNOSIS — Z8249 Family history of ischemic heart disease and other diseases of the circulatory system: Secondary | ICD-10-CM

## 2021-06-25 DIAGNOSIS — F411 Generalized anxiety disorder: Secondary | ICD-10-CM | POA: Diagnosis present

## 2021-06-25 DIAGNOSIS — E039 Hypothyroidism, unspecified: Secondary | ICD-10-CM | POA: Diagnosis present

## 2021-06-25 DIAGNOSIS — Z79890 Hormone replacement therapy (postmenopausal): Secondary | ICD-10-CM | POA: Diagnosis not present

## 2021-06-25 DIAGNOSIS — Z86010 Personal history of colonic polyps: Secondary | ICD-10-CM | POA: Diagnosis not present

## 2021-06-25 LAB — LACTIC ACID, PLASMA
Lactic Acid, Venous: 0.8 mmol/L (ref 0.5–1.9)
Lactic Acid, Venous: 0.8 mmol/L (ref 0.5–1.9)

## 2021-06-25 LAB — URINALYSIS, COMPLETE (UACMP) WITH MICROSCOPIC
Bacteria, UA: NONE SEEN
Bilirubin Urine: NEGATIVE
Glucose, UA: NEGATIVE mg/dL
Ketones, ur: NEGATIVE mg/dL
Leukocytes,Ua: NEGATIVE
Nitrite: NEGATIVE
Protein, ur: 100 mg/dL — AB
Specific Gravity, Urine: 1.016 (ref 1.005–1.030)
pH: 6 (ref 5.0–8.0)

## 2021-06-25 LAB — COMPREHENSIVE METABOLIC PANEL
ALT: 10 U/L (ref 0–44)
AST: 14 U/L — ABNORMAL LOW (ref 15–41)
Albumin: 3.6 g/dL (ref 3.5–5.0)
Alkaline Phosphatase: 77 U/L (ref 38–126)
Anion gap: 4 — ABNORMAL LOW (ref 5–15)
BUN: 7 mg/dL — ABNORMAL LOW (ref 8–23)
CO2: 30 mmol/L (ref 22–32)
Calcium: 8.4 mg/dL — ABNORMAL LOW (ref 8.9–10.3)
Chloride: 103 mmol/L (ref 98–111)
Creatinine, Ser: 0.66 mg/dL (ref 0.44–1.00)
GFR, Estimated: >60 mL/min (ref >60)
Glucose, Bld: 139 mg/dL — ABNORMAL HIGH (ref 70–99)
Potassium: 4.1 mmol/L (ref 3.5–5.1)
Sodium: 137 mmol/L (ref 135–145)
Total Bilirubin: 0.7 mg/dL (ref 0.3–1.2)
Total Protein: 7 g/dL (ref 6.5–8.1)

## 2021-06-25 LAB — CBC WITH DIFFERENTIAL/PLATELET
Abs Immature Granulocytes: 0.03 10*3/uL (ref 0.00–0.07)
Basophils Absolute: 0 10*3/uL (ref 0.0–0.1)
Basophils Relative: 0 %
Eosinophils Absolute: 0.2 10*3/uL (ref 0.0–0.5)
Eosinophils Relative: 2 %
HCT: 43.3 % (ref 36.0–46.0)
Hemoglobin: 13.7 g/dL (ref 12.0–15.0)
Immature Granulocytes: 0 %
Lymphocytes Relative: 12 %
Lymphs Abs: 1.4 10*3/uL (ref 0.7–4.0)
MCH: 27.7 pg (ref 26.0–34.0)
MCHC: 31.6 g/dL (ref 30.0–36.0)
MCV: 87.5 fL (ref 80.0–100.0)
Monocytes Absolute: 0.7 10*3/uL (ref 0.1–1.0)
Monocytes Relative: 6 %
Neutro Abs: 9.5 10*3/uL — ABNORMAL HIGH (ref 1.7–7.7)
Neutrophils Relative %: 80 %
Platelets: 161 10*3/uL (ref 150–400)
RBC: 4.95 MIL/uL (ref 3.87–5.11)
RDW: 14.8 % (ref 11.5–15.5)
WBC: 11.9 10*3/uL — ABNORMAL HIGH (ref 4.0–10.5)
nRBC: 0 % (ref 0.0–0.2)

## 2021-06-25 LAB — RESP PANEL BY RT-PCR (FLU A&B, COVID) ARPGX2
Influenza A by PCR: NEGATIVE
Influenza B by PCR: NEGATIVE
SARS Coronavirus 2 by RT PCR: NEGATIVE

## 2021-06-25 MED ORDER — ACETAMINOPHEN 325 MG PO TABS
650.0000 mg | ORAL_TABLET | Freq: Four times a day (QID) | ORAL | Status: DC | PRN
  Administered 2021-06-25 – 2021-06-26 (×2): 650 mg via ORAL
  Filled 2021-06-25 (×2): qty 2

## 2021-06-25 MED ORDER — ONDANSETRON HCL 4 MG/2ML IJ SOLN: 4.0000 mg | Freq: Three times a day (TID) | INTRAMUSCULAR | Status: DC | PRN

## 2021-06-25 MED ORDER — IPRATROPIUM-ALBUTEROL 0.5-2.5 (3) MG/3ML IN SOLN
3.0000 mL | Freq: Once | RESPIRATORY_TRACT | Status: AC
  Administered 2021-06-25: 3 mL via RESPIRATORY_TRACT

## 2021-06-25 MED ORDER — SODIUM CHLORIDE 0.9 % IV SOLN
500.0000 mg | Freq: Once | INTRAVENOUS | Status: AC
  Administered 2021-06-25: 500 mg via INTRAVENOUS
  Filled 2021-06-25: qty 500

## 2021-06-25 MED ORDER — IPRATROPIUM-ALBUTEROL 0.5-2.5 (3) MG/3ML IN SOLN
3.0000 mL | Freq: Once | RESPIRATORY_TRACT | Status: AC
  Administered 2021-06-25: 3 mL via RESPIRATORY_TRACT
  Filled 2021-06-25: qty 9

## 2021-06-25 MED ORDER — SIMVASTATIN 20 MG PO TABS
40.0000 mg | ORAL_TABLET | Freq: Every day | ORAL | Status: DC
  Administered 2021-06-25 – 2021-06-27 (×3): 40 mg via ORAL
  Filled 2021-06-25: qty 2
  Filled 2021-06-25: qty 4
  Filled 2021-06-25: qty 2

## 2021-06-25 MED ORDER — NICOTINE 21 MG/24HR TD PT24
21.0000 mg | MEDICATED_PATCH | Freq: Every day | TRANSDERMAL | Status: DC
  Administered 2021-06-26 – 2021-06-27 (×2): 21 mg via TRANSDERMAL
  Filled 2021-06-25 (×2): qty 1

## 2021-06-25 MED ORDER — ENOXAPARIN SODIUM 40 MG/0.4ML IJ SOSY
40.0000 mg | PREFILLED_SYRINGE | INTRAMUSCULAR | Status: DC
  Administered 2021-06-25 – 2021-06-26 (×2): 40 mg via SUBCUTANEOUS
  Filled 2021-06-25 (×2): qty 0.4

## 2021-06-25 MED ORDER — IPRATROPIUM-ALBUTEROL 0.5-2.5 (3) MG/3ML IN SOLN
3.0000 mL | RESPIRATORY_TRACT | Status: DC
  Administered 2021-06-25 – 2021-06-26 (×5): 3 mL via RESPIRATORY_TRACT
  Filled 2021-06-25 (×5): qty 3

## 2021-06-25 MED ORDER — SODIUM CHLORIDE 0.9 % IV SOLN
500.0000 mg | INTRAVENOUS | Status: AC
  Administered 2021-06-26: 12:00:00 500 mg via INTRAVENOUS
  Filled 2021-06-25: qty 500

## 2021-06-25 MED ORDER — SODIUM CHLORIDE 0.9 % IV SOLN
1.0000 g | Freq: Once | INTRAVENOUS | Status: AC
  Administered 2021-06-25: 1 g via INTRAVENOUS
  Filled 2021-06-25: qty 10

## 2021-06-25 MED ORDER — ALBUTEROL SULFATE (2.5 MG/3ML) 0.083% IN NEBU: 2.5000 mg | INHALATION_SOLUTION | RESPIRATORY_TRACT | Status: DC | PRN

## 2021-06-25 MED ORDER — GABAPENTIN 300 MG PO CAPS
800.0000 mg | ORAL_CAPSULE | Freq: Four times a day (QID) | ORAL | Status: DC
  Administered 2021-06-25 – 2021-06-27 (×7): 800 mg via ORAL
  Filled 2021-06-25 (×7): qty 2

## 2021-06-25 MED ORDER — ACETAMINOPHEN 325 MG PO TABS
650.0000 mg | ORAL_TABLET | Freq: Once | ORAL | Status: AC
  Administered 2021-06-25: 650 mg via ORAL
  Filled 2021-06-25: qty 2

## 2021-06-25 MED ORDER — PAROXETINE HCL 20 MG PO TABS
40.0000 mg | ORAL_TABLET | Freq: Every day | ORAL | Status: DC
  Administered 2021-06-26 – 2021-06-27 (×2): 40 mg via ORAL
  Filled 2021-06-25 (×2): qty 2

## 2021-06-25 MED ORDER — MIRTAZAPINE 15 MG PO TBDP
15.0000 mg | ORAL_TABLET | Freq: Every day | ORAL | Status: DC
  Administered 2021-06-25 – 2021-06-26 (×2): 15 mg via ORAL
  Filled 2021-06-25 (×4): qty 1

## 2021-06-25 MED ORDER — SODIUM CHLORIDE 0.9 % IV SOLN
1.0000 g | INTRAVENOUS | Status: DC
  Administered 2021-06-26: 14:00:00 1 g via INTRAVENOUS
  Filled 2021-06-25: qty 1
  Filled 2021-06-25: qty 10

## 2021-06-25 MED ORDER — SODIUM CHLORIDE 0.9 % IV BOLUS
1000.0000 mL | Freq: Once | INTRAVENOUS | Status: AC
  Administered 2021-06-25: 1000 mL via INTRAVENOUS

## 2021-06-25 MED ORDER — METHYLPREDNISOLONE SODIUM SUCC 40 MG IJ SOLR
40.0000 mg | Freq: Two times a day (BID) | INTRAMUSCULAR | Status: DC
  Administered 2021-06-25 – 2021-06-27 (×4): 40 mg via INTRAVENOUS
  Filled 2021-06-25 (×4): qty 1

## 2021-06-25 MED ORDER — LEVOTHYROXINE SODIUM 137 MCG PO TABS
137.0000 ug | ORAL_TABLET | Freq: Every day | ORAL | Status: DC
  Administered 2021-06-26 – 2021-06-27 (×2): 137 ug via ORAL
  Filled 2021-06-25 (×2): qty 1

## 2021-06-25 MED ORDER — DM-GUAIFENESIN ER 30-600 MG PO TB12
1.0000 | ORAL_TABLET | Freq: Two times a day (BID) | ORAL | Status: DC | PRN
  Administered 2021-06-25 – 2021-06-27 (×2): 1 via ORAL
  Filled 2021-06-25 (×2): qty 1

## 2021-06-25 NOTE — ED Notes (Signed)
Informed RN bed assigned 

## 2021-06-25 NOTE — ED Notes (Signed)
Transport request placed for patient.  

## 2021-06-25 NOTE — H&P (Signed)
History and Physical    Caroline Coleman YPP:[id number] DOB: 04-30-1956 DOA: 06/25/2021  Referring MD/NP/PA:   PCP: Hillery Aldo, MD   Patient coming from:  The patient is coming from home.  At baseline, pt is independent for most of ADL.        Chief Complaint: Cough, shortness breath  HPI: Caroline Coleman is a 65 y.o. female with medical history significant of COPD on her using oxygen normally, hyperlipidemia, hypothyroidism, depression with anxiety, tobacco abuse in remission for 3 years, tobacco abuse, who presents with cough and shortness breath.  Patient states that she has been sick for more than 1 week.  She has productive cough with brownish colored sputum production, shortness of breath, which has been progressively worsening.  Patient has mild fever and chills.  No chest pain.  Denies nausea, vomiting, diarrhea or abdominal pain.  No symptoms of UTI.  Patient stated she quit drinking alcohol for more than 3 years.  ED Course: pt was found to have negative COVID PCR, urinalysis negative for UTI, WBC 11.9, lactic acid 0.8, 0.8, electrolytes renal function okay, temperature 100.3, blood pressure 98/88, heart rate 79, RR 23 --> 17, oxygen saturations 84% on room air, which improved to 100% on 2 L oxygen currently.  Chest x-ray showed mild bilateral lower lobe opacity.  Patient is admitted to MedSurg bed as inpatient  Review of Systems:   General: has fevers, chills, no body weight gain, has fatigue HEENT: no blurry vision, hearing changes or sore throat Respiratory: has dyspnea, coughing, no wheezing CV: no chest pain, no palpitations GI: no nausea, vomiting, abdominal pain, diarrhea, constipation GU: no dysuria, burning on urination, increased urinary frequency, hematuria  Ext: no leg edema Neuro: no unilateral weakness, numbness, or tingling, no vision change or hearing loss Skin: no rash, no skin tear. MSK: No muscle spasm, no deformity, no limitation of range of movement  in spin Heme: No easy bruising.  Travel history: No recent long distant travel.  Allergy:  Allergies  Allergen Reactions   Penicillins Other (See Comments)    Rash and SOB per pt   Sulfa Antibiotics Other (See Comments)    REACTION UNKNOWN     Past Medical History:  Diagnosis Date   Anxiety    COPD (chronic obstructive pulmonary disease) (HCC)    Depression    Thyroid disease     Past Surgical History:  Procedure Laterality Date   EYE SURGERY     HIP SURGERY      Social History:  reports that she has been smoking cigarettes. She has a 22.50 pack-year smoking history. She uses smokeless tobacco. She reports that she does not drink alcohol and does not use drugs.  Family History:  Family History  Problem Relation Age of Onset   Breast cancer Mother    Heart attack Father    Heart attack Brother      Prior to Admission medications   Medication Sig Start Date End Date Taking? Authorizing Provider  albuterol (VENTOLIN HFA) 108 (90 Base) MCG/ACT inhaler Inhale 2 puffs into the lungs every 6 (six) hours as needed for wheezing or shortness of breath. 07/03/19   Concha Se, MD  doxycycline (VIBRA-TABS) 100 MG tablet Take 1 tablet (100 mg total) by mouth every 12 (twelve) hours. 01/28/21   Almon Hercules, MD  gabapentin (NEURONTIN) 400 MG capsule Take 1 capsule (400 mg total) by mouth 2 (two) times daily. Patient taking differently: Take 800 mg by  mouth 4 (four) times daily. 06/06/17 08/23/18  Myrna Blazer, MD  levothyroxine (SYNTHROID) 150 MCG tablet Take 1 tablet (150 mcg total) by mouth daily. 07/27/17 08/23/18  Tommi Rumps, PA-C  PARoxetine (PAXIL) 40 MG tablet Take 1 tablet (40 mg total) by mouth daily. 07/27/17 08/23/18  Tommi Rumps, PA-C  predniSONE (DELTASONE) 50 MG tablet Take 1 tablet (50 mg total) by mouth daily with breakfast. 01/28/21   Almon Hercules, MD  simvastatin (ZOCOR) 40 MG tablet Take 40 mg by mouth daily. 10/01/20   [provider]   umeclidinium-vilanterol (ANORO ELLIPTA) 62.5-25 MCG/INH AEPB Inhale 1 puff into the lungs daily. 01/28/21   Almon Hercules, MD    Physical Exam: Vitals:   06/25/21 1330 06/25/21 1333 06/25/21 1336 06/25/21 1402  BP:    94/61  Pulse: 82 79 79 91  Resp: 18 17 17  (!) 21  Temp:    100.1 F (37.8 C)  TempSrc:    Oral  SpO2: 96% 98% 100% 93%  Weight:      Height:       General: Not in acute distress HEENT:       Eyes: PERRL, EOMI, no scleral icterus.       ENT: No discharge from the ears and nose, no pharynx injection, no tonsillar enlargement.        Neck: No JVD, no bruit, no mass felt. Heme: No neck lymph node enlargement. Cardiac: S1/S2, RRR, No murmurs, No gallops or rubs. Respiratory: Has rhonchi bilaterally GI: Soft, nondistended, nontender, no rebound pain, no organomegaly, BS present. GU: No hematuria Ext: No pitting leg edema bilaterally. 1+DP/PT pulse bilaterally. Musculoskeletal: No joint deformities, No joint redness or warmth, no limitation of ROM in spin. Skin: No rashes.  Neuro: Alert, oriented X3, cranial nerves II-XII grossly intact, moves all extremities normally. Psych: Patient is not psychotic, no suicidal or hemocidal ideation.  Labs on Admission: I have personally reviewed following labs and imaging studies  CBC: Recent Labs  Lab 06/25/21 1147  WBC 11.9*  NEUTROABS 9.5*  HGB 13.7  HCT 43.3  MCV 87.5  PLT 161   Basic Metabolic Panel: Recent Labs  Lab 06/25/21 1147  NA 137  K 4.1  CL 103  CO2 30  GLUCOSE 139*  BUN 7*  CREATININE 0.66  CALCIUM 8.4*   GFR: Estimated Creatinine Clearance: 71.4 mL/min (by C-G formula based on SCr of 0.66 mg/dL). Liver Function Tests: Recent Labs  Lab 06/25/21 1147  AST 14*  ALT 10  ALKPHOS 77  BILITOT 0.7  PROT 7.0  ALBUMIN 3.6   No results for input(s): LIPASE, AMYLASE in the last 168 hours. No results for input(s): AMMONIA in the last 168 hours. Coagulation Profile: No results for input(s): INR,  PROTIME in the last 168 hours. Cardiac Enzymes: No results for input(s): CKTOTAL, CKMB, CKMBINDEX, TROPONINI in the last 168 hours. BNP (last 3 results) No results for input(s): PROBNP in the last 8760 hours. HbA1C: No results for input(s): HGBA1C in the last 72 hours. CBG: No results for input(s): GLUCAP in the last 168 hours. Lipid Profile: No results for input(s): CHOL, HDL, LDLCALC, TRIG, CHOLHDL, LDLDIRECT in the last 72 hours. Thyroid Function Tests: No results for input(s): TSH, T4TOTAL, FREET4, T3FREE, THYROIDAB in the last 72 hours. Anemia Panel: No results for input(s): VITAMINB12, FOLATE, FERRITIN, TIBC, IRON, RETICCTPCT in the last 72 hours. Urine analysis:    Component Value Date/Time   COLORURINE YELLOW (A) 06/25/2021 1140  APPEARANCEUR CLEAR (A) 06/25/2021 1140   APPEARANCEUR Turbid 01/16/2013 1541   LABSPEC 1.016 06/25/2021 1140   LABSPEC 1.025 01/16/2013 1541   PHURINE 6.0 06/25/2021 1140   GLUCOSEU NEGATIVE 06/25/2021 1140   GLUCOSEU Negative 01/16/2013 1541   HGBUR MODERATE (A) 06/25/2021 1140   BILIRUBINUR NEGATIVE 06/25/2021 1140   BILIRUBINUR Negative 01/16/2013 1541   KETONESUR NEGATIVE 06/25/2021 1140   PROTEINUR 100 (A) 06/25/2021 1140   NITRITE NEGATIVE 06/25/2021 1140   LEUKOCYTESUR NEGATIVE 06/25/2021 1140   LEUKOCYTESUR 3+ 01/16/2013 1541   Sepsis Labs: @LABRCNTIP (procalcitonin:4,lacticidven:4) ) Recent Results (from the past 240 hour(s))  Resp Panel by RT-PCR (Flu A&B, Covid) Nasopharyngeal Swab     Status: None   Collection Time: 06/25/21 11:47 AM   Specimen: Nasopharyngeal Swab; Nasopharyngeal(NP) swabs in vial transport medium  Result Value Ref Range Status   SARS Coronavirus 2 by RT PCR NEGATIVE NEGATIVE Final    Comment: (NOTE) SARS-CoV-2 target nucleic acids are NOT DETECTED.  The SARS-CoV-2 RNA is generally detectable in upper respiratory specimens during the acute phase of infection. The lowest concentration of SARS-CoV-2 viral  copies this assay can detect is 138 copies/mL. A negative result does not preclude SARS-Cov-2 infection and should not be used as the sole basis for treatment or other patient management decisions. A negative result may occur with  improper specimen collection/handling, submission of specimen other than nasopharyngeal swab, presence of viral mutation(s) within the areas targeted by this assay, and inadequate number of viral copies(<138 copies/mL). A negative result must be combined with clinical observations, patient history, and epidemiological information. The expected result is Negative.  Fact Sheet for Patients:  06/27/21  Fact Sheet for Healthcare Providers:  BloggerCourse.com  This test is no t yet approved or cleared by the [employer]ious[employer].[place] FDA and  has been authorized for detection and/or diagnosis of SARS-CoV-2 by FDA under an Emergency Use Authorization (EUA). This EUA will remain  in effect (meaning this test can be used) for the duration of the COVID-19 declaration under Section 564(b)(1) of the Act, 21 U.S.C.section 360bbb-3(b)(1), unless the authorization is terminated  or revoked sooner.       Influenza A by PCR NEGATIVE NEGATIVE Final   Influenza B by PCR NEGATIVE NEGATIVE Final    Comment: (NOTE) The Xpert Xpress SARS-CoV-2/FLU/RSV plus assay is intended as an aid in the diagnosis of influenza from Nasopharyngeal swab specimens and should not be used as a sole basis for treatment. Nasal washings and aspirates are unacceptable for Xpert Xpress SARS-CoV-2/FLU/RSV testing.  Fact Sheet for Patients: [place]  Fact Sheet for Healthcare Providers: BloggerCourse.com  This test is not yet approved or cleared by the [employer]ious[employer].[place] FDA and has been authorized for detection and/or diagnosis of SARS-CoV-2 by FDA under an Emergency Use Authorization (EUA). This  EUA will remain in effect (meaning this test can be used) for the duration of the COVID-19 declaration under Section 564(b)(1) of the Act, 21 U.S.C. section 360bbb-3(b)(1), unless the authorization is terminated or revoked.  Performed at [facility], [address] [place], [place]      Radiological Exams on Admission: DG Chest 2 View  Result Date: 06/25/2021 CLINICAL DATA:  Shortness of burr EXAM: CHEST - 2 VIEW COMPARISON:  Radiograph 01/27/2021 FINDINGS: Unchanged cardiomediastinal silhouette. There are faint bilateral airspace opacities in the lower lungs. Mild interstitial prominence. No large pleural effusion or visible pneumothorax. There are chronic left upper rib injuries. IMPRESSION: Faint bilateral lower lung airspace opacities which concerning  for infectious/inflammatory process. Electronically Signed   By: Caprice RenshawJacob  Kahn   On: 06/25/2021 12:31     EKG: I have personally reviewed.  Sinus rhythm, QTC 408, LAE, low voltage.  Assessment/Plan Principal Problem:   CAP (community acquired pneumonia) Active Problems:   Acquired hypothyroidism   COPD exacerbation (HCC)   HLD (hyperlipidemia)   Depression with anxiety   Tobacco abuse   Acute respiratory failure with hypoxia (HCC)  Acute respiratory failure with hypoxia due to CAP and COPD exacerbation: Patient has mild leukocytosis with WBC 11.9, mild fever of 100.3, no tachycardia, and RR 23, does not meet critical for sepsis. Lactic acid normal.  Blood pressure soft, but hemodynamically stable currently.  Patient has new oxygen requirement, currently on 2 L oxygen.   - Will admit to med-surg bed as inpt - Incentive spirometry - Solu-Medrol 40 mg twice daily - IV Rocephin and azithromycin - Mucinex for cough  - Bronchodilators - Urine legionella and S. pneumococcal antigen - Follow up blood culture x2, sputum culture   Acquired hypothyroidism -Synthroid  HLD (hyperlipidemia) -Zocor  Depression  with anxiety -Continue home medications: Remeron, Paxil  Tobacco abuse: -Did counseling about importance of quitting smoking -Nicotine patch        DVT ppx:  SQ Lovenox Code Status: Full code Family Communication: not done, no family member is at bed side.   Disposition Plan:  Anticipate discharge back to previous environment Consults called:  none Admission status and Level of care: Med-Surg:     as inpt       Status is: Inpatient  Remains inpatient appropriate because:Inpatient level of care appropriate due to severity of illness  Dispo: The patient is from: Home              Anticipated d/c is to: Home              Patient currently is not medically stable to d/c.   Difficult to place patient No           Date of Service 06/25/2021    Lorretta HarpXilin Matei Magnone [facility]   If 7PM-7AM, please contact night-coverage [email or web address] 06/25/2021, 3:02 PM

## 2021-06-25 NOTE — ED Notes (Signed)
Pt provided with coffee at this time.  

## 2021-06-25 NOTE — Progress Notes (Signed)
Skin swarm completed two abrasions noted to back. On 2L nasal cannula. Oriented to room and call light system.

## 2021-06-25 NOTE — ED Provider Notes (Signed)
[facility] Emergency Department Provider Note ____________________________________________  Time seen: 1130  I have reviewed the triage vital signs and the nursing notes.  HISTORY  Chief Complaint  Shortness of Breath   HPI Caroline Coleman is a 65 y.o. female with a history of COPD, presents to the ED for evaluation of 1 week complaint of shortness of breath.  Patient is not normally on oxygen at baseline, but reports that last week she has had cough and shortness of breath.  She finally called EMS today after noting a sharp increase in shortness of breath.  EMS found patient to be satting at about 84% on room air upon arrival.  They got up to 96% with 4 L by nasal cannula.  Patient received a albuterol nebulizer, and a dose of Solu-Medrol in route to ED.  She reports improvement in her symptoms at this time.  Past Medical History:  Diagnosis Date   Anxiety    COPD (chronic obstructive pulmonary disease) (HCC)    Depression    Thyroid disease     Patient Active Problem List   Diagnosis Date Noted   CAP (community acquired pneumonia) 06/25/2021   HLD (hyperlipidemia) 06/25/2021   Depression with anxiety 06/25/2021   Tobacco abuse 06/25/2021   Acute on chronic respiratory failure with hypoxia (HCC) 06/25/2021   COPD exacerbation (HCC) 01/27/2021   Nicotine dependence 01/27/2021   Acute respiratory failure (HCC) 08/24/2018   Self-inflicted laceration of wrist (HCC) 08/03/2016   Substance induced mood disorder (HCC) 08/03/2016   Alcohol abuse 08/03/2016   Panic disorder with agoraphobia 12/14/2015   Insomnia 12/14/2015   Depression 12/14/2015   Acquired hypothyroidism 05/04/2015   Colon polyps 05/04/2015   COPD, moderate (HCC) 05/04/2015   Edentulous 05/04/2015   Family history of early CAD 05/04/2015   Generalized anxiety disorder 05/04/2015   Goiter 05/04/2015   Pure hypercholesterolemia 05/04/2015   S/P lens implant 05/04/2015    Past  Surgical History:  Procedure Laterality Date   EYE SURGERY     HIP SURGERY      Prior to Admission medications   Medication Sig Start Date End Date Taking? Authorizing Provider  ADVAIR DISKUS 250-50 MCG/ACT AEPB Inhale 1 puff into the lungs 2 (two) times daily. 04/06/21  Yes [provider]  albuterol (VENTOLIN HFA) 108 (90 Base) MCG/ACT inhaler Inhale 2 puffs into the lungs every 6 (six) hours as needed for wheezing or shortness of breath. 07/03/19  Yes Concha Se, MD  gabapentin (NEURONTIN) 400 MG capsule Take 1 capsule (400 mg total) by mouth 2 (two) times daily. Patient taking differently: Take 800 mg by mouth 4 (four) times daily. 06/06/17 06/25/21 Yes Myrna Blazer, MD  levothyroxine (SYNTHROID) 137 MCG tablet Take 137 mcg by mouth daily. 06/01/21  Yes [provider]  mirtazapine (REMERON SOL-TAB) 15 MG disintegrating tablet Take 15 mg by mouth at bedtime. 06/01/21  Yes [provider]  PARoxetine (PAXIL) 40 MG tablet Take 1 tablet (40 mg total) by mouth daily. 07/27/17 06/25/21 Yes Summers, Rhonda L, PA-C  simvastatin (ZOCOR) 40 MG tablet Take 40 mg by mouth daily. 10/01/20  Yes [provider]  umeclidinium-vilanterol (ANORO ELLIPTA) 62.5-25 MCG/INH AEPB Inhale 1 puff into the lungs daily. 01/28/21  Yes Almon Hercules, MD  doxycycline (VIBRA-TABS) 100 MG tablet Take 1 tablet (100 mg total) by mouth every 12 (twelve) hours. Patient not taking: No sig reported 01/28/21   Almon Hercules, MD  levothyroxine (SYNTHROID) 150  MCG tablet Take 1 tablet (150 mcg total) by mouth daily. Patient not taking: Reported on 06/25/2021 07/27/17 06/25/21  Tommi Rumps, PA-C  predniSONE (DELTASONE) 50 MG tablet Take 1 tablet (50 mg total) by mouth daily with breakfast. Patient not taking: No sig reported 01/28/21   Almon Hercules, MD  STIOLTO RESPIMAT 2.5-2.5 MCG/ACT AERS SMARTSIG:2 Puff(s) Via Inhaler Daily 04/25/21   [provider]    Allergies Penicillins and  Sulfa antibiotics  No family history on file.  Social History Social History   Tobacco Use   Smoking status: Every Day    Packs/day: 0.50    Years: 45.00    Pack years: 22.50    Types: Cigarettes   Smokeless tobacco: Current  Vaping Use   Vaping Use: Never used  Substance Use Topics   Alcohol use: No   Drug use: No    Review of Systems  Constitutional: Negative for fever. Eyes: Negative for visual changes. ENT: Negative for sore throat. Cardiovascular: Negative for chest pain. Respiratory: Positive for shortness of breath. Gastrointestinal: Negative for abdominal pain, vomiting and diarrhea. Genitourinary: Negative for dysuria. Musculoskeletal: Negative for back pain. Skin: Negative for rash. Neurological: Negative for headaches, focal weakness or numbness. ____________________________________________  PHYSICAL EXAM:  VITAL SIGNS: ED Triage Vitals  Enc Vitals Group     BP 06/25/21 1123 (!) 132/54     Pulse Rate 06/25/21 1123 93     Resp 06/25/21 1123 (!) 23     Temp 06/25/21 1123 100.3 F (37.9 C)     Temp Source 06/25/21 1123 Oral     SpO2 06/25/21 1119 (!) 84 %     Weight 06/25/21 1128 170 lb (77.1 kg)     Height 06/25/21 1128 5\' 4"  (1.626 m)     Head Circumference --      Peak Flow --      Pain Score 06/25/21 1128 7     Pain Loc --      Pain Edu? --      Excl. in GC? --     Constitutional: Alert and oriented. Well appearing and in no distress. Head: Normocephalic and atraumatic. Eyes: Conjunctivae are normal. PERRL. Normal extraocular movements Ears: Canals clear. TMs intact bilaterally. Nose: No congestion/rhinorrhea/epistaxis. Mouth/Throat: Mucous membranes are moist. Neck: Supple. No thyromegaly. Hematological/Lymphatic/Immunological: No cervical lymphadenopathy. Cardiovascular: Normal rate, regular rhythm. Normal distal pulses. Respiratory: Normal respiratory effort. No wheezes/rales/rhonchi. Gastrointestinal: Soft and nontender. No  distention. Musculoskeletal: Nontender with normal range of motion in all extremities.  Neurologic:  Normal gait without ataxia. Normal speech and language. No gross focal neurologic deficits are appreciated. Skin:  Skin is warm, dry and intact. No rash noted. Psychiatric: Mood and affect are normal. Patient exhibits appropriate insight and judgment. ____________________________________________    LABS (pertinent positives/negatives)  Labs Reviewed  CBC WITH DIFFERENTIAL/PLATELET - Abnormal; Notable for the following components:      Result Value   WBC 11.9 (*)    Neutro Abs 9.5 (*)    All other components within normal limits  COMPREHENSIVE METABOLIC PANEL - Abnormal; Notable for the following components:   Glucose, Bld 139 (*)    BUN 7 (*)    Calcium 8.4 (*)    AST 14 (*)    Anion gap 4 (*)    All other components within normal limits  URINALYSIS, COMPLETE (UACMP) WITH MICROSCOPIC - Abnormal; Notable for the following components:   Color, Urine YELLOW (*)    APPearance CLEAR (*)  Hgb urine dipstick MODERATE (*)    Protein, ur 100 (*)    All other components within normal limits  RESP PANEL BY RT-PCR (FLU A&B, COVID) ARPGX2  CULTURE, BLOOD (ROUTINE X 2)  CULTURE, BLOOD (ROUTINE X 2)  LACTIC ACID, PLASMA  LACTIC ACID, PLASMA  ____________________________________________  EKG  Vent. rate 97 BPM PR interval 133 ms QRS duration 77 ms QT/QTcB 321/408 ms P-R-T axes 71 38 55 Sinus rhythm No STEMI ____________________________________________   RADIOLOGY Official radiology report(s): DG Chest 2 View  Result Date: 06/25/2021 CLINICAL DATA:  Shortness of burr EXAM: CHEST - 2 VIEW COMPARISON:  Radiograph 01/27/2021 FINDINGS: Unchanged cardiomediastinal silhouette. There are faint bilateral airspace opacities in the lower lungs. Mild interstitial prominence. No large pleural effusion or visible pneumothorax. There are chronic left upper rib injuries. IMPRESSION: Faint  bilateral lower lung airspace opacities which concerning for infectious/inflammatory process. Electronically Signed   By: Caprice Renshaw   On: 06/25/2021 12:31   ____________________________________________  PROCEDURES  DuoNeb x3 Azithromycin 500 mg IVPB Rocephin 1 g IVPB Acetaminophen 650 mg p.o.  Procedures ____________________________________________   INITIAL IMPRESSION / ASSESSMENT AND PLAN / ED COURSE  As part of my medical decision making, I reviewed the following data within the electronic MEDICAL RECORD NUMBER Labs reviewed as above, EKG interpreted NSR and no ST segment changes, Old chart reviewed, Radiograph reviewed as noted, Discussed with admitting physician Jarvis Morgan, MD, Evaluated by EM attending C. Erma Heritage, MD, and Notes from prior ED visits    Differential includes, but is not limited to, viral syndrome, bronchitis including COPD exacerbation, pneumonia, reactive airway disease including asthma, CHF including exacerbation with or without pulmonary/interstitial edema, pneumothorax, ACS, thoracic trauma, and pulmonary embolism.  Patient ED evaluation of only complaint of shortness of breath, with sharp increase in her symptoms today.  Brought into the ED by EMS for evaluation.  She was found to be hypoxic on initial report from EMS, required new oxygen application to nasal cannula.  O2 sats improved in the ED, and she maintained in the mid 90s at 2 L per nasal cannula.  She did have a mild elevation of white count 11.2, and her chest x-ray revealed bilateral lower segment opacities concerning for infectious process.  Started empirically on azithromycin and Rocephin for community-acquired pneumonia, viral panel screen is negative for influenza or COVID at this time.  Patient is agreeable to admission, and hospitalist service will be contacted at this time.    Caroline Coleman was evaluated in Emergency Department on 06/25/2021 for the symptoms described in the history of present illness.  She was evaluated in the context of the global COVID-19 pandemic, which necessitated consideration that the patient might be at risk for infection with the SARS-CoV-2 virus that causes COVID-19. Institutional protocols and algorithms that pertain to the evaluation of patients at risk for COVID-19 are in a state of rapid change based on information released by regulatory bodies including the CDC and federal and state organizations. These policies and algorithms were followed during the patient's care in the ED. ____________________________________________  FINAL CLINICAL IMPRESSION(S) / ED DIAGNOSES  Final diagnoses:  Community acquired pneumonia, unspecified laterality  Acute respiratory failure with hypoxia (HCC)      Greg Cratty, Charlesetta Ivory, PA-C 06/25/21 1441    Shaune Pollack, MD 06/25/21 2157

## 2021-06-25 NOTE — ED Triage Notes (Signed)
Pt BIB [facility] from home C/O SOB X 1 week. Per EMS, pt 84% RA, 96% 4L [place]. Received 2.5 albuterol, 125 Solumedrol en route.

## 2021-06-26 DIAGNOSIS — J9601 Acute respiratory failure with hypoxia: Secondary | ICD-10-CM | POA: Diagnosis not present

## 2021-06-26 DIAGNOSIS — J189 Pneumonia, unspecified organism: Secondary | ICD-10-CM | POA: Diagnosis not present

## 2021-06-26 LAB — BLOOD CULTURE ID PANEL (REFLEXED) - BCID2

## 2021-06-26 LAB — CBC
HCT: 38.4 % (ref 36.0–46.0)
Hemoglobin: 12.2 g/dL (ref 12.0–15.0)
MCH: 28.4 pg (ref 26.0–34.0)
MCHC: 31.8 g/dL (ref 30.0–36.0)
MCV: 89.3 fL (ref 80.0–100.0)
Platelets: 148 10*3/uL — ABNORMAL LOW (ref 150–400)
RBC: 4.3 MIL/uL (ref 3.87–5.11)
RDW: 14.9 % (ref 11.5–15.5)
WBC: 9.7 10*3/uL (ref 4.0–10.5)
nRBC: 0 % (ref 0.0–0.2)

## 2021-06-26 LAB — BRAIN NATRIURETIC PEPTIDE: B Natriuretic Peptide: 152.7 pg/mL — ABNORMAL HIGH (ref 0.0–100.0)

## 2021-06-26 MED ORDER — ORAL CARE MOUTH RINSE
15.0000 mL | Freq: Two times a day (BID) | OROMUCOSAL | Status: DC
  Administered 2021-06-26 – 2021-06-27 (×2): 15 mL via OROMUCOSAL

## 2021-06-26 MED ORDER — IPRATROPIUM-ALBUTEROL 0.5-2.5 (3) MG/3ML IN SOLN
3.0000 mL | Freq: Four times a day (QID) | RESPIRATORY_TRACT | Status: DC
  Administered 2021-06-26 – 2021-06-27 (×2): 3 mL via RESPIRATORY_TRACT
  Filled 2021-06-26 (×2): qty 3

## 2021-06-26 MED ORDER — AZITHROMYCIN 500 MG PO TABS
500.0000 mg | ORAL_TABLET | Freq: Every day | ORAL | Status: DC
  Administered 2021-06-27: 09:00:00 500 mg via ORAL
  Filled 2021-06-26: qty 1

## 2021-06-26 NOTE — Progress Notes (Signed)
PHARMACIST - PHYSICIAN COMMUNICATION  CONCERNING: Antibiotic IV to Oral Route Change Policy  RECOMMENDATION: This patient is receiving azithromycin by the intravenous route.  Based on criteria approved by the Pharmacy and Therapeutics Committee, the antibiotic(s) is/are being converted to the equivalent oral dose form(s).   DESCRIPTION: These criteria include: Patient being treated for a respiratory tract infection, urinary tract infection, cellulitis or clostridium difficile associated diarrhea if on metronidazole The patient is not neutropenic and does not exhibit a GI malabsorption state The patient is eating (either orally or via tube) and/or has been taking other orally administered medications for a least 24 hours The patient is improving clinically and has a Tmax < 100.5  If you have questions about this conversion, please contact the Pharmacy Department   [name]  06/26/21

## 2021-06-26 NOTE — Progress Notes (Signed)
PHARMACY - PHYSICIAN COMMUNICATION CRITICAL VALUE ALERT - BLOOD CULTURE IDENTIFICATION (BCID)  Caroline Coleman is an 65 y.o. female who presented to [facility] on 06/25/2021 with a chief complaint of Pneumonia  Assessment:  1/4 bottles GPC. BCID detected Staphylococcus species (not aureus, epidermidis, lugdunensis). This result may reflect contamination. Though, per chart review, patient noted to have skin abrasions on back.   Name of physician (or Provider) Contacted: Dr. Chipper Herb  Current antibiotics: Ceftriaxone + azithromycin  Changes to prescribed antibiotics recommended:  --Continue antibiotics as currently prescribed --Continue to monitor --Add vancomycin if concern for true infection   Results for orders placed or performed during the hospital encounter of 06/25/21  Blood Culture ID Panel (Reflexed) (Collected: 06/25/2021 11:40 AM)  Result Value Ref Range   Enterococcus faecalis NOT DETECTED NOT DETECTED   Enterococcus Faecium NOT DETECTED NOT DETECTED   Listeria monocytogenes NOT DETECTED NOT DETECTED   Staphylococcus species DETECTED (A) NOT DETECTED   Staphylococcus aureus (BCID) NOT DETECTED NOT DETECTED   Staphylococcus epidermidis NOT DETECTED NOT DETECTED   Staphylococcus lugdunensis NOT DETECTED NOT DETECTED   Streptococcus species NOT DETECTED NOT DETECTED   Streptococcus agalactiae NOT DETECTED NOT DETECTED   Streptococcus pneumoniae NOT DETECTED NOT DETECTED   Streptococcus pyogenes NOT DETECTED NOT DETECTED   A.calcoaceticus-baumannii NOT DETECTED NOT DETECTED   Bacteroides fragilis NOT DETECTED NOT DETECTED   Enterobacterales NOT DETECTED NOT DETECTED   Enterobacter cloacae complex NOT DETECTED NOT DETECTED   Escherichia coli NOT DETECTED NOT DETECTED   Klebsiella aerogenes NOT DETECTED NOT DETECTED   Klebsiella oxytoca NOT DETECTED NOT DETECTED   Klebsiella pneumoniae NOT DETECTED NOT DETECTED   Proteus species NOT DETECTED NOT DETECTED   Salmonella  species NOT DETECTED NOT DETECTED   Serratia marcescens NOT DETECTED NOT DETECTED   Haemophilus influenzae NOT DETECTED NOT DETECTED   Neisseria meningitidis NOT DETECTED NOT DETECTED   Pseudomonas aeruginosa NOT DETECTED NOT DETECTED   Stenotrophomonas maltophilia NOT DETECTED NOT DETECTED   Candida albicans NOT DETECTED NOT DETECTED   Candida auris NOT DETECTED NOT DETECTED   Candida glabrata NOT DETECTED NOT DETECTED   Candida krusei NOT DETECTED NOT DETECTED   Candida parapsilosis NOT DETECTED NOT DETECTED   Candida tropicalis NOT DETECTED NOT DETECTED   Cryptococcus neoformans/gattii NOT DETECTED NOT DETECTED    [name] 06/26/2021  8:00 AM

## 2021-06-26 NOTE — Progress Notes (Signed)
PROGRESS NOTE    Caroline Coleman  FVC:[id number] DOB: 1956/02/17 DOA: 06/25/2021 PCP: Hillery Aldo, MD   Chief complaint.  Short of breath. Brief Narrative:   Caroline Coleman is a 65 y.o. female with medical history significant of COPD on her using oxygen normally, hyperlipidemia, hypothyroidism, depression with anxiety, tobacco abuse in remission for 3 years, tobacco abuse, who presents with cough and shortness breath.  Chest x-ray showed bilateral lower lobe infiltrates.  She is diagnosed with pneumonia, treated with Zithromax and Rocephin.  Assessment & Plan:   Principal Problem:   CAP (community acquired pneumonia) Active Problems:   Acquired hypothyroidism   COPD exacerbation (HCC)   HLD (hyperlipidemia)   Depression with anxiety   Tobacco abuse   Acute respiratory failure with hypoxia (HCC)  #1.  Acute respiratory failure with hypoxemia. 's community-acquired pneumonia. COPD exacerbation. Patient condition improving, currently on 2 L oxygen with good saturation. Continue steroids. Continue antibiotics. 1 set of blood culture positive for staph, appear to be contaminants.  We will wait for the final culture results.  2.  Hypothyroidism. Continue Synthroid.    DVT prophylaxis: Lovenox Code Status: full Family Communication:  Disposition Plan:    Status is: Inpatient  Remains inpatient appropriate because:IV treatments appropriate due to intensity of illness or inability to take PO and Inpatient level of care appropriate due to severity of illness  Dispo: The patient is from: Home              Anticipated d/c is to: Home              Patient currently is not medically stable to d/c.   Difficult to place patient No        I/O last 3 completed shifts: In: 1587 [P.O.:237; IV Piggyback:1350] Out: -  Total I/O In: 600 [P.O.:600] Out: -      Consultants:  none  Procedures: None  Antimicrobials:  Rocephin and Zithromax  Subjective: Patient  doing well today.  No additional fever today.  No chills. Short of breath improving, cough, nonproductive. No dysuria hematuria. No abdominal pain or nausea vomiting.  Objective: Vitals:   06/26/21 0907 06/26/21 0909 06/26/21 1100 06/26/21 1258  BP: (!) 95/56 (!) 101/52  115/67  Pulse: 70 70  72  Resp: 15   17  Temp: 97.9 F (36.6 C)   97.9 F (36.6 C)  TempSrc:      SpO2: 93%  95% 97%  Weight:      Height:        Intake/Output Summary (Last 24 hours) at 06/26/2021 1421 Last data filed at 06/26/2021 1412 Gross per 24 hour  Intake 2087 ml  Output --  Net 2087 ml   Filed Weights   06/25/21 1128 06/25/21 2052  Weight: 77.1 kg 75.1 kg    Examination:  General exam: Appears calm and comfortable  Respiratory system: Coarse breathing sounds in the base.Marland Kitchen Respiratory effort normal. Cardiovascular system: S1 & S2 heard, RRR. No JVD, murmurs, rubs, gallops or clicks. No pedal edema. Gastrointestinal system: Abdomen is nondistended, soft and nontender. No organomegaly or masses felt. Normal bowel sounds heard. Central nervous system: Alert and oriented. No focal neurological deficits. Extremities: Symmetric 5 x 5 power. Skin: No rashes, lesions or ulcers Psychiatry: Judgement and insight appear normal. Mood & affect appropriate.     Data Reviewed: I have personally reviewed following labs and imaging studies  CBC: Recent Labs  Lab 06/25/21 1147 06/26/21 0637  WBC 11.9* 9.7  NEUTROABS 9.5*  --   HGB 13.7 12.2  HCT 43.3 38.4  MCV 87.5 89.3  PLT 161 148*   Basic Metabolic Panel: Recent Labs  Lab 06/25/21 1147  NA 137  K 4.1  CL 103  CO2 30  GLUCOSE 139*  BUN 7*  CREATININE 0.66  CALCIUM 8.4*   GFR: Estimated Creatinine Clearance: 70.5 mL/min (by C-G formula based on SCr of 0.66 mg/dL). Liver Function Tests: Recent Labs  Lab 06/25/21 1147  AST 14*  ALT 10  ALKPHOS 77  BILITOT 0.7  PROT 7.0  ALBUMIN 3.6   No results for input(s): LIPASE, AMYLASE in  the last 168 hours. No results for input(s): AMMONIA in the last 168 hours. Coagulation Profile: No results for input(s): INR, PROTIME in the last 168 hours. Cardiac Enzymes: No results for input(s): CKTOTAL, CKMB, CKMBINDEX, TROPONINI in the last 168 hours. BNP (last 3 results) No results for input(s): PROBNP in the last 8760 hours. HbA1C: No results for input(s): HGBA1C in the last 72 hours. CBG: No results for input(s): GLUCAP in the last 168 hours. Lipid Profile: No results for input(s): CHOL, HDL, LDLCALC, TRIG, CHOLHDL, LDLDIRECT in the last 72 hours. Thyroid Function Tests: No results for input(s): TSH, T4TOTAL, FREET4, T3FREE, THYROIDAB in the last 72 hours. Anemia Panel: No results for input(s): VITAMINB12, FOLATE, FERRITIN, TIBC, IRON, RETICCTPCT in the last 72 hours. Sepsis Labs: Recent Labs  Lab 06/25/21 1140 06/25/21 1334  LATICACIDVEN 0.8 0.8    Recent Results (from the past 240 hour(s))  Culture, blood (routine x 2)     Status: None (Preliminary result)   Collection Time: 06/25/21 11:40 AM   Specimen: BLOOD  Result Value Ref Range Status   Specimen Description BLOOD LEFT ANTECUBITAL  Final   Special Requests   Final    BOTTLES DRAWN AEROBIC AND ANAEROBIC Blood Culture adequate volume   Culture  Setup Time   Final    Organism ID to follow AEROBIC BOTTLE ONLY GRAM POSITIVE COCCI IN CLUSTERS CRITICAL RESULT CALLED TO, READ BACK BY AND VERIFIED WITH: ALEX CHAPPELL, PHARMD AT 0800 ON 06/26/21 BY GM Performed at [facility], [address] [place], [place] [address]    Culture GRAM POSITIVE COCCI  Final   Report Status PENDING  Incomplete  Blood Culture ID Panel (Reflexed)     Status: Abnormal   Collection Time: 06/25/21 11:40 AM  Result Value Ref Range Status   Enterococcus faecalis NOT DETECTED NOT DETECTED Final   Enterococcus Faecium NOT DETECTED NOT DETECTED Final   Listeria monocytogenes NOT DETECTED NOT DETECTED Final   Staphylococcus  species DETECTED (A) NOT DETECTED Final    Comment: CRITICAL RESULT CALLED TO, READ BACK BY AND VERIFIED WITH: ALEX CHAPPELL, PHARMD AT 0800 ON 06/26/21 BY GM    Staphylococcus aureus (BCID) NOT DETECTED NOT DETECTED Final   Staphylococcus epidermidis NOT DETECTED NOT DETECTED Final   Staphylococcus lugdunensis NOT DETECTED NOT DETECTED Final   Streptococcus species NOT DETECTED NOT DETECTED Final   Streptococcus agalactiae NOT DETECTED NOT DETECTED Final   Streptococcus pneumoniae NOT DETECTED NOT DETECTED Final   Streptococcus pyogenes NOT DETECTED NOT DETECTED Final   A.calcoaceticus-baumannii NOT DETECTED NOT DETECTED Final   Bacteroides fragilis NOT DETECTED NOT DETECTED Final   Enterobacterales NOT DETECTED NOT DETECTED Final   Enterobacter cloacae complex NOT DETECTED NOT DETECTED Final   Escherichia coli NOT DETECTED NOT DETECTED Final   Klebsiella aerogenes NOT DETECTED NOT DETECTED Final   Klebsiella oxytoca  NOT DETECTED NOT DETECTED Final   Klebsiella pneumoniae NOT DETECTED NOT DETECTED Final   Proteus species NOT DETECTED NOT DETECTED Final   Salmonella species NOT DETECTED NOT DETECTED Final   Serratia marcescens NOT DETECTED NOT DETECTED Final   Haemophilus influenzae NOT DETECTED NOT DETECTED Final   Neisseria meningitidis NOT DETECTED NOT DETECTED Final   Pseudomonas aeruginosa NOT DETECTED NOT DETECTED Final   Stenotrophomonas maltophilia NOT DETECTED NOT DETECTED Final   Candida albicans NOT DETECTED NOT DETECTED Final   Candida auris NOT DETECTED NOT DETECTED Final   Candida glabrata NOT DETECTED NOT DETECTED Final   Candida krusei NOT DETECTED NOT DETECTED Final   Candida parapsilosis NOT DETECTED NOT DETECTED Final   Candida tropicalis NOT DETECTED NOT DETECTED Final   Cryptococcus neoformans/gattii NOT DETECTED NOT DETECTED Final    Comment: Performed at [facility], [address] [place], [place] [address]  Resp Panel by RT-PCR (Flu A&B, Covid)  Nasopharyngeal Swab     Status: None   Collection Time: 06/25/21 11:47 AM   Specimen: Nasopharyngeal Swab; Nasopharyngeal(NP) swabs in vial transport medium  Result Value Ref Range Status   SARS Coronavirus 2 by RT PCR NEGATIVE NEGATIVE Final    Comment: (NOTE) SARS-CoV-2 target nucleic acids are NOT DETECTED.  The SARS-CoV-2 RNA is generally detectable in upper respiratory specimens during the acute phase of infection. The lowest concentration of SARS-CoV-2 viral copies this assay can detect is 138 copies/mL. A negative result does not preclude SARS-Cov-2 infection and should not be used as the sole basis for treatment or other patient management decisions. A negative result may occur with  improper specimen collection/handling, submission of specimen other than nasopharyngeal swab, presence of viral mutation(s) within the areas targeted by this assay, and inadequate number of viral copies(<138 copies/mL). A negative result must be combined with clinical observations, patient history, and epidemiological information. The expected result is Negative.  Fact Sheet for Patients:  BloggerCourse.com  Fact Sheet for Healthcare Providers:  SeriousBroker.it  This test is no t yet approved or cleared by the [place] FDA and  has been authorized for detection and/or diagnosis of SARS-CoV-2 by FDA under an Emergency Use Authorization (EUA). This EUA will remain  in effect (meaning this test can be used) for the duration of the COVID-19 declaration under Section 564(b)(1) of the Act, 21 U.S.C.section 360bbb-3(b)(1), unless the authorization is terminated  or revoked sooner.       Influenza A by PCR NEGATIVE NEGATIVE Final   Influenza B by PCR NEGATIVE NEGATIVE Final    Comment: (NOTE) The Xpert Xpress SARS-CoV-2/FLU/RSV plus assay is intended as an aid in the diagnosis of influenza from Nasopharyngeal swab specimens and should not be  used as a sole basis for treatment. Nasal washings and aspirates are unacceptable for Xpert Xpress SARS-CoV-2/FLU/RSV testing.  Fact Sheet for Patients: BloggerCourse.com  Fact Sheet for Healthcare Providers: SeriousBroker.it  This test is not yet approved or cleared by the [place] FDA and has been authorized for detection and/or diagnosis of SARS-CoV-2 by FDA under an Emergency Use Authorization (EUA). This EUA will remain in effect (meaning this test can be used) for the duration of the COVID-19 declaration under Section 564(b)(1) of the Act, 21 U.S.C. section 360bbb-3(b)(1), unless the authorization is terminated or revoked.  Performed at [facility], [address] [place], [place] [address]   Culture, blood (routine x 2)     Status: None (Preliminary result)   Collection Time: 06/25/21  1:34 PM   Specimen: BLOOD  Result Value Ref Range Status   Specimen Description BLOOD BLOOD RIGHT HAND  Final   Special Requests   Final    BOTTLES DRAWN AEROBIC AND ANAEROBIC Blood Culture adequate volume   Culture   Final    NO GROWTH < 24 HOURS Performed at [facility], [address], [place], [place] [address]    Report Status PENDING  Incomplete         Radiology Studies: DG Chest 2 View  Result Date: 06/25/2021 CLINICAL DATA:  Shortness of burr EXAM: CHEST - 2 VIEW COMPARISON:  Radiograph 01/27/2021 FINDINGS: Unchanged cardiomediastinal silhouette. There are faint bilateral airspace opacities in the lower lungs. Mild interstitial prominence. No large pleural effusion or visible pneumothorax. There are chronic left upper rib injuries. IMPRESSION: Faint bilateral lower lung airspace opacities which concerning for infectious/inflammatory process. Electronically Signed   By: Caprice Renshaw   On: 06/25/2021 12:31        Scheduled Meds:  Melene Muller ON 06/27/2021] azithromycin  500 mg Oral Daily   enoxaparin  (LOVENOX) injection  40 mg Subcutaneous Q24H   gabapentin  800 mg Oral QID   ipratropium-albuterol  3 mL Nebulization Q6H   levothyroxine  137 mcg Oral Q0600   methylPREDNISolone (SOLU-MEDROL) injection  40 mg Intravenous Q12H   mirtazapine  15 mg Oral QHS   nicotine  21 mg Transdermal Daily   PARoxetine  40 mg Oral Daily   simvastatin  40 mg Oral Daily   Continuous Infusions:  cefTRIAXone (ROCEPHIN)  IV 1 g (06/26/21 1350)     LOS: 1 day    Time spent: 28 minutes    Marrion Coy, MD [facility]   To contact the attending provider between 7A-7P or the covering provider during after hours 7P-7A, please log into the web site [email or web address] and access using universal [place] password for that web site. If you do not have the password, please call the hospital operator.  06/26/2021, 2:21 PM

## 2021-06-27 DIAGNOSIS — J441 Chronic obstructive pulmonary disease with (acute) exacerbation: Secondary | ICD-10-CM | POA: Diagnosis not present

## 2021-06-27 DIAGNOSIS — J9601 Acute respiratory failure with hypoxia: Secondary | ICD-10-CM | POA: Diagnosis not present

## 2021-06-27 DIAGNOSIS — J189 Pneumonia, unspecified organism: Secondary | ICD-10-CM | POA: Diagnosis not present

## 2021-06-27 MED ORDER — AZITHROMYCIN 500 MG PO TABS: 500.0000 mg | ORAL_TABLET | Freq: Every day | ORAL | 0 refills | Status: AC

## 2021-06-27 MED ORDER — PREDNISONE 10 MG PO TABS: ORAL_TABLET | ORAL | 0 refills | Status: AC

## 2021-06-27 MED ORDER — CEFDINIR 300 MG PO CAPS: 300.0000 mg | ORAL_CAPSULE | Freq: Two times a day (BID) | ORAL | 0 refills | Status: AC

## 2021-06-27 NOTE — Progress Notes (Signed)
Patient has been discharged home.  Discharge papers given and explained to patient, verbalized understanding.  Meds and f/u appointments reviewed.  Rx sent electronically to the pharmacy.  Pt made aware.

## 2021-06-27 NOTE — Discharge Summary (Signed)
Physician Discharge Summary  Patient ID: Caroline Coleman MRN: [id number] DOB/AGE: 1956/11/23 65 y.o.  Admit date: 06/25/2021 Discharge date: 06/27/2021  Admission Diagnoses:  Discharge Diagnoses:  Principal Problem:   CAP (community acquired pneumonia) Active Problems:   Acquired hypothyroidism   COPD exacerbation (HCC)   HLD (hyperlipidemia)   Depression with anxiety   Tobacco abuse   Acute respiratory failure with hypoxia (HCC)   Discharged Condition: good  Hospital Course:  Caroline Coleman is a 65 y.o. female with medical history significant of COPD on her using oxygen normally, hyperlipidemia, hypothyroidism, depression with anxiety, tobacco abuse in remission for 3 years, tobacco abuse, who presents with cough and shortness breath.  Chest x-ray showed bilateral lower lobe infiltrates.  She is diagnosed with pneumonia, treated with Zithromax and Rocephin.    #1.  Acute respiratory failure with hypoxemia. Community-acquired pneumonia. COPD exacerbation. Patient condition had improved, currently off oxygen.  We will continue steroid taper, continue oral antibiotics with Zithromax and cefdinir.  Patient is advised to follow-up with PCP in 1 week time. 1 set of blood culture positive for staph hominis, appear to be contaminant.    2.  Hypothyroidism. Continue Synthroid. Consults: None  Significant Diagnostic Studies:  CHEST - 2 VIEW   COMPARISON:  Radiograph 01/27/2021   FINDINGS: Unchanged cardiomediastinal silhouette. There are faint bilateral airspace opacities in the lower lungs. Mild interstitial prominence. No large pleural effusion or visible pneumothorax. There are chronic left upper rib injuries.   IMPRESSION: Faint bilateral lower lung airspace opacities which concerning for infectious/inflammatory process.     Electronically Signed   By: Caprice Renshaw   On: 06/25/2021 12:31   Treatments: Zithromax and Rocephin.  Discharge Exam: Blood pressure  (!) 125/57, pulse 76, temperature 98 F (36.7 C), temperature source Oral, resp. rate 17, height 5\' 4"  (1.626 m), weight 75.1 kg, SpO2 96 %. General appearance: alert and cooperative Resp: clear to auscultation bilaterally Cardio: regular rate and rhythm, S1, S2 normal, no murmur, click, rub or gallop GI: soft, non-tender; bowel sounds normal; no masses,  no organomegaly Extremities: extremities normal, atraumatic, no cyanosis or edema  Disposition: Discharge disposition: 01-Home or Self Care      Discharge Instructions     Diet - low sodium heart healthy   Complete by: As directed    Increase activity slowly   Complete by: As directed       Allergies as of 06/27/2021       Reactions   Penicillins Other (See Comments)   Has patient had a PCN reaction causing immediate rash, facial/tongue/throat swelling, SOB or lightheadedness with hypotension: no Has patient had a PCN reaction causing severe rash involving mucus membranes or skin necrosis: no Has patient had a PCN reaction that required hospitalization no REACTION UNKNOWN Has patient had a PCN reaction occurring within the last 10 years: yes If all of the above answers are "NO", then may proceed with Cephalosporin use.   Sulfa Antibiotics Other (See Comments)   REACTION UNKNOWN        Medication List     STOP taking these medications    doxycycline 100 MG tablet Commonly known as: VIBRA-TABS       TAKE these medications    Advair Diskus 250-50 MCG/ACT Aepb Generic drug: fluticasone-salmeterol Inhale 1 puff into the lungs 2 (two) times daily.   albuterol 108 (90 Base) MCG/ACT inhaler Commonly known as: VENTOLIN HFA Inhale 2 puffs into the lungs every 6 (six) hours as needed  for wheezing or shortness of breath.   Anoro Ellipta 62.5-25 MCG/INH Aepb Generic drug: umeclidinium-vilanterol Inhale 1 puff into the lungs daily.   azithromycin 500 MG tablet Commonly known as: ZITHROMAX Take 1 tablet (500 mg  total) by mouth daily for 2 days. Start taking on: June 28, 2021   cefdinir 300 MG capsule Commonly known as: OMNICEF Take 1 capsule (300 mg total) by mouth 2 (two) times daily for 5 days.   gabapentin 400 MG capsule Commonly known as: Neurontin Take 1 capsule (400 mg total) by mouth 2 (two) times daily. What changed:  how much to take when to take this   levothyroxine 150 MCG tablet Commonly known as: Synthroid Take 1 tablet (150 mcg total) by mouth daily.   levothyroxine 137 MCG tablet Commonly known as: SYNTHROID Take 137 mcg by mouth daily.   mirtazapine 15 MG disintegrating tablet Commonly known as: REMERON SOL-TAB Take 15 mg by mouth at bedtime.   PARoxetine 40 MG tablet Commonly known as: Paxil Take 1 tablet (40 mg total) by mouth daily.   predniSONE 10 MG tablet Commonly known as: DELTASONE Take 4 tablets (40 mg total) by mouth daily for 2 days, THEN 2 tablets (20 mg total) daily for 3 days, THEN 1 tablet (10 mg total) daily for 3 days. Start taking on: June 27, 2021 What changed:  medication strength See the new instructions.   simvastatin 40 MG tablet Commonly known as: ZOCOR Take 40 mg by mouth daily.   Stiolto Respimat 2.5-2.5 MCG/ACT Aers Generic drug: Tiotropium Bromide-Olodaterol SMARTSIG:2 Puff(s) Via Inhaler Daily        Follow-up Information     Hillery Aldo, MD Follow up in 1 week(s).   Specialty: Family Medicine Contact information: [address] [place] [phone]                 Signed: Marrion Coy 06/27/2021, 12:51 PM

## 2021-06-28 LAB — CULTURE, BLOOD (ROUTINE X 2): Special Requests: ADEQUATE

## 2021-06-30 LAB — CULTURE, BLOOD (ROUTINE X 2)
Culture: NO GROWTH
Special Requests: ADEQUATE

## 2021-08-16 ENCOUNTER — Inpatient Hospital Stay
Admission: EM | Admit: 2021-08-16 | Discharge: 2021-08-19 | DRG: 190 | Disposition: A | Discharge status: Home / Self Care | Payer: Medicaid Other | Attending: Internal Medicine | Admitting: Internal Medicine

## 2021-08-16 ENCOUNTER — Emergency Department: Payer: Medicaid Other

## 2021-08-16 ENCOUNTER — Other Ambulatory Visit: Payer: Self-pay

## 2021-08-16 DIAGNOSIS — G629 Polyneuropathy, unspecified: Secondary | ICD-10-CM | POA: Diagnosis present

## 2021-08-16 DIAGNOSIS — M545 Low back pain, unspecified: Secondary | ICD-10-CM | POA: Diagnosis present

## 2021-08-16 DIAGNOSIS — E039 Hypothyroidism, unspecified: Secondary | ICD-10-CM | POA: Diagnosis present

## 2021-08-16 DIAGNOSIS — F411 Generalized anxiety disorder: Secondary | ICD-10-CM | POA: Diagnosis present

## 2021-08-16 DIAGNOSIS — F17210 Nicotine dependence, cigarettes, uncomplicated: Secondary | ICD-10-CM | POA: Diagnosis present

## 2021-08-16 DIAGNOSIS — Z7951 Long term (current) use of inhaled steroids: Secondary | ICD-10-CM

## 2021-08-16 DIAGNOSIS — Z20822 Contact with and (suspected) exposure to covid-19: Secondary | ICD-10-CM | POA: Diagnosis present

## 2021-08-16 DIAGNOSIS — E785 Hyperlipidemia, unspecified: Secondary | ICD-10-CM | POA: Diagnosis present

## 2021-08-16 DIAGNOSIS — Z79890 Hormone replacement therapy (postmenopausal): Secondary | ICD-10-CM

## 2021-08-16 DIAGNOSIS — Z9114 Patient's other noncompliance with medication regimen: Secondary | ICD-10-CM

## 2021-08-16 DIAGNOSIS — J9601 Acute respiratory failure with hypoxia: Secondary | ICD-10-CM | POA: Diagnosis present

## 2021-08-16 DIAGNOSIS — J441 Chronic obstructive pulmonary disease with (acute) exacerbation: Principal | ICD-10-CM | POA: Diagnosis present

## 2021-08-16 DIAGNOSIS — Z79899 Other long term (current) drug therapy: Secondary | ICD-10-CM

## 2021-08-16 DIAGNOSIS — E7800 Pure hypercholesterolemia, unspecified: Secondary | ICD-10-CM | POA: Diagnosis present

## 2021-08-16 DIAGNOSIS — F32A Depression, unspecified: Secondary | ICD-10-CM | POA: Diagnosis present

## 2021-08-16 DIAGNOSIS — Z8701 Personal history of pneumonia (recurrent): Secondary | ICD-10-CM

## 2021-08-16 DIAGNOSIS — Z720 Tobacco use: Secondary | ICD-10-CM | POA: Diagnosis present

## 2021-08-16 DIAGNOSIS — R350 Frequency of micturition: Secondary | ICD-10-CM | POA: Diagnosis present

## 2021-08-16 LAB — RESP PANEL BY RT-PCR (FLU A&B, COVID) ARPGX2
Influenza A by PCR: NEGATIVE
Influenza B by PCR: NEGATIVE
SARS Coronavirus 2 by RT PCR: NEGATIVE

## 2021-08-16 LAB — BASIC METABOLIC PANEL
Anion gap: 9 (ref 5–15)
BUN: 8 mg/dL (ref 8–23)
CO2: 34 mmol/L — ABNORMAL HIGH (ref 22–32)
Calcium: 8.2 mg/dL — ABNORMAL LOW (ref 8.9–10.3)
Chloride: 100 mmol/L (ref 98–111)
Creatinine, Ser: 0.7 mg/dL (ref 0.44–1.00)
GFR, Estimated: >60 mL/min (ref >60)
Glucose, Bld: 117 mg/dL — ABNORMAL HIGH (ref 70–99)
Potassium: 3.9 mmol/L (ref 3.5–5.1)
Sodium: 143 mmol/L (ref 135–145)

## 2021-08-16 LAB — CBC
HCT: 42.8 % (ref 36.0–46.0)
Hemoglobin: 13.7 g/dL (ref 12.0–15.0)
MCH: 29.3 pg (ref 26.0–34.0)
MCHC: 32 g/dL (ref 30.0–36.0)
MCV: 91.5 fL (ref 80.0–100.0)
Platelets: 181 10*3/uL (ref 150–400)
RBC: 4.68 MIL/uL (ref 3.87–5.11)
RDW: 15.9 % — ABNORMAL HIGH (ref 11.5–15.5)
WBC: 6.5 10*3/uL (ref 4.0–10.5)
nRBC: 0 % (ref 0.0–0.2)

## 2021-08-16 LAB — TROPONIN I (HIGH SENSITIVITY): Troponin I (High Sensitivity): 3 ng/L (ref <18)

## 2021-08-16 MED ORDER — ENOXAPARIN SODIUM 40 MG/0.4ML IJ SOSY
40.0000 mg | PREFILLED_SYRINGE | Freq: Every day | INTRAMUSCULAR | Status: DC
  Administered 2021-08-17 – 2021-08-18 (×2): 40 mg via SUBCUTANEOUS
  Filled 2021-08-16 (×2): qty 0.4

## 2021-08-16 MED ORDER — IPRATROPIUM-ALBUTEROL 0.5-2.5 (3) MG/3ML IN SOLN
3.0000 mL | Freq: Four times a day (QID) | RESPIRATORY_TRACT | Status: DC
  Administered 2021-08-16 – 2021-08-19 (×10): 3 mL via RESPIRATORY_TRACT
  Filled 2021-08-16 (×10): qty 3

## 2021-08-16 MED ORDER — UMECLIDINIUM-VILANTEROL 62.5-25 MCG/INH IN AEPB
1.0000 | INHALATION_SPRAY | Freq: Every day | RESPIRATORY_TRACT | Status: DC
  Administered 2021-08-17 – 2021-08-18 (×2): 1 via RESPIRATORY_TRACT
  Filled 2021-08-16: qty 14

## 2021-08-16 MED ORDER — LEVOTHYROXINE SODIUM 50 MCG PO TABS
75.0000 ug | ORAL_TABLET | Freq: Every day | ORAL | Status: DC
  Administered 2021-08-17 – 2021-08-18 (×2): 75 ug via ORAL
  Filled 2021-08-16: qty 1
  Filled 2021-08-16: qty 2

## 2021-08-16 MED ORDER — SIMVASTATIN 40 MG PO TABS
40.0000 mg | ORAL_TABLET | Freq: Every day | ORAL | Status: DC
  Administered 2021-08-17 – 2021-08-18 (×2): 40 mg via ORAL
  Filled 2021-08-16 (×3): qty 1

## 2021-08-16 MED ORDER — PAROXETINE HCL 20 MG PO TABS
40.0000 mg | ORAL_TABLET | Freq: Every day | ORAL | Status: DC
  Administered 2021-08-17 – 2021-08-19 (×3): 40 mg via ORAL
  Filled 2021-08-16 (×4): qty 2

## 2021-08-16 MED ORDER — IPRATROPIUM-ALBUTEROL 0.5-2.5 (3) MG/3ML IN SOLN
3.0000 mL | Freq: Once | RESPIRATORY_TRACT | Status: AC
  Administered 2021-08-16: 3 mL via RESPIRATORY_TRACT
  Filled 2021-08-16: qty 3

## 2021-08-16 MED ORDER — NICOTINE 14 MG/24HR TD PT24
14.0000 mg | MEDICATED_PATCH | Freq: Every day | TRANSDERMAL | Status: DC
  Administered 2021-08-17 – 2021-08-18 (×2): 14 mg via TRANSDERMAL
  Filled 2021-08-16 (×3): qty 1

## 2021-08-16 MED ORDER — ONDANSETRON HCL 4 MG/2ML IJ SOLN: 4.0000 mg | Freq: Four times a day (QID) | INTRAMUSCULAR | Status: DC | PRN

## 2021-08-16 MED ORDER — METHYLPREDNISOLONE SODIUM SUCC 40 MG IJ SOLR
40.0000 mg | Freq: Two times a day (BID) | INTRAMUSCULAR | Status: AC
  Administered 2021-08-16 – 2021-08-17 (×3): 40 mg via INTRAVENOUS
  Filled 2021-08-16 (×3): qty 1

## 2021-08-16 MED ORDER — ACETAMINOPHEN 325 MG PO TABS: 650.0000 mg | ORAL_TABLET | Freq: Four times a day (QID) | ORAL | Status: DC | PRN

## 2021-08-16 MED ORDER — AZITHROMYCIN 500 MG PO TABS
500.0000 mg | ORAL_TABLET | Freq: Every day | ORAL | Status: AC
  Administered 2021-08-16: 500 mg via ORAL
  Filled 2021-08-16: qty 1

## 2021-08-16 MED ORDER — OXYCODONE HCL 5 MG PO TABS
5.0000 mg | ORAL_TABLET | ORAL | Status: DC | PRN
Start: 2021-08-16 — End: 2021-08-19
  Administered 2021-08-17 – 2021-08-18 (×2): 5 mg via ORAL
  Filled 2021-08-16 (×2): qty 1

## 2021-08-16 MED ORDER — POLYETHYLENE GLYCOL 3350 17 G PO PACK: 17.0000 g | PACK | Freq: Every day | ORAL | Status: DC | PRN

## 2021-08-16 MED ORDER — AZITHROMYCIN 250 MG PO TABS
250.0000 mg | ORAL_TABLET | Freq: Every day | ORAL | Status: DC
  Administered 2021-08-17 – 2021-08-19 (×3): 250 mg via ORAL
  Filled 2021-08-16 (×3): qty 1

## 2021-08-16 MED ORDER — MOMETASONE FURO-FORMOTEROL FUM 200-5 MCG/ACT IN AERO: 2.0000 | INHALATION_SPRAY | Freq: Two times a day (BID) | RESPIRATORY_TRACT | Status: DC

## 2021-08-16 MED ORDER — MELATONIN 5 MG PO TABS
2.5000 mg | ORAL_TABLET | Freq: Every evening | ORAL | Status: DC | PRN
  Administered 2021-08-17 – 2021-08-18 (×2): 2.5 mg via ORAL
  Filled 2021-08-16 (×2): qty 1

## 2021-08-16 MED ORDER — MIRTAZAPINE 15 MG PO TBDP
15.0000 mg | ORAL_TABLET | Freq: Every day | ORAL | Status: DC
  Administered 2021-08-17 – 2021-08-18 (×2): 15 mg via ORAL
  Filled 2021-08-16 (×4): qty 1

## 2021-08-16 MED ORDER — PREDNISONE 20 MG PO TABS
60.0000 mg | ORAL_TABLET | Freq: Once | ORAL | Status: AC
  Administered 2021-08-16: 60 mg via ORAL
  Filled 2021-08-16: qty 3

## 2021-08-16 NOTE — H&P (Addendum)
History and Physical  Caroline Coleman:[id number] DOB: 02/08/1956 DOA: 08/16/2021  Referring physician: Dr. Sidney Ace, EDP  PCP: Hillery Aldo, MD  Outpatient Specialists: None. Patient coming from: Home.  Chief Complaint: Shortness of breath, productive cough.  HPI: Caroline Coleman is a 65 y.o. female with medical history significant for multiple admissions for COPD exacerbation, recently discharged in July 2022 for the same, ongoing tobacco use, COPD, hypothyroidism, chronic anxiety/depression, hyperlipidemia, peripheral neuropathy, who presented to [facility] ED due to shortness of breath of 2 days duration, worsened today.  Ongoing tobacco use, cut down tobacco use to half a pack a day.  Associated with productive cough of a mixture of yellow, gray, and green sputum.  Denies any chest pain.  Endorses chills and night sweats, but no subjective fevers.  No GI symptoms.  She endorses lower back pain and polyuria.  Upon presentation to the ED, she is found to be wheezing, hypoxic with ambulation, O2 saturation in the 80s.  Chest x-ray no acute findings.  Work-up in the ED revealed acute COPD exacerbation.  She received 3 doses of DuoNeb's and 1 dose of prednisone tablet 60 mg x 1.  TRH, hospitalist team, was asked to admit.  ED Course: Temperature 98.5.  BP 110/55, pulse 72, respiratory rate 18, O2 saturation 98% on 6 L.  COVID-19 screening test negative.  Troponin negative, 3.  BMP and CBC essentially unremarkable.  Review of Systems: Review of systems as noted in the HPI. All other systems reviewed and are negative.   Past Medical History:  Diagnosis Date   Anxiety    COPD (chronic obstructive pulmonary disease) (HCC)    Depression    Thyroid disease    Past Surgical History:  Procedure Laterality Date   EYE SURGERY     HIP SURGERY      Social History:  reports that she has been smoking cigarettes. She has a 22.50 pack-year smoking history. She uses smokeless tobacco. She reports that  she does not drink alcohol and does not use drugs.   Allergies  Allergen Reactions   Penicillins Other (See Comments)    Has patient had a PCN reaction causing immediate rash, facial/tongue/throat swelling, SOB or lightheadedness with hypotension: no Has patient had a PCN reaction causing severe rash involving mucus membranes or skin necrosis: no Has patient had a PCN reaction that required hospitalization no  REACTION UNKNOWN Has patient had a PCN reaction occurring within the last 10 years: yes If all of the above answers are "NO", then may proceed with Cephalosporin use.     Sulfa Antibiotics Other (See Comments)    REACTION UNKNOWN    Family History  Problem Relation Age of Onset   Breast cancer Mother    Heart attack Father    Heart attack Brother       Prior to Admission medications   Medication Sig Start Date End Date Taking? Authorizing Provider  ADVAIR DISKUS 250-50 MCG/ACT AEPB Inhale 1 puff into the lungs 2 (two) times daily. 04/06/21   [provider]  albuterol (VENTOLIN HFA) 108 (90 Base) MCG/ACT inhaler Inhale 2 puffs into the lungs every 6 (six) hours as needed for wheezing or shortness of breath. 07/03/19   Concha Se, MD  gabapentin (NEURONTIN) 400 MG capsule Take 1 capsule (400 mg total) by mouth 2 (two) times daily. Patient taking differently: Take 800 mg by mouth 4 (four) times daily. 06/06/17 06/25/21  Myrna Blazer, MD  levothyroxine (SYNTHROID) 150 MCG tablet  Take 1 tablet (150 mcg total) by mouth daily. Patient not taking: Reported on 06/25/2021 07/27/17 06/25/21  Tommi Rumps, PA-C  mirtazapine (REMERON SOL-TAB) 15 MG disintegrating tablet Take 15 mg by mouth at bedtime. 06/01/21   [provider]  PARoxetine (PAXIL) 40 MG tablet Take 1 tablet (40 mg total) by mouth daily. 07/27/17 06/25/21  Tommi Rumps, PA-C  simvastatin (ZOCOR) 40 MG tablet Take 40 mg by mouth daily. 10/01/20   [provider]  STIOLTO RESPIMAT  2.5-2.5 MCG/ACT AERS SMARTSIG:2 Puff(s) Via Inhaler Daily 04/25/21   [provider]  umeclidinium-vilanterol (ANORO ELLIPTA) 62.5-25 MCG/INH AEPB Inhale 1 puff into the lungs daily. 01/28/21   Almon Hercules, MD    Physical Exam: BP (!) 110/55 (BP Location: Right Arm)   Pulse 89   Temp 98.5 F (36.9 C) (Oral)   Resp 18   Ht 5\' 4"  (1.626 m)   Wt 79.4 kg   SpO2 91%   BMI 30.04 kg/m   General: 65 y.o. year-old female well developed well nourished in no acute distress.  Alert and oriented x3. Cardiovascular: Regular rate and rhythm with no rubs or gallops.  No thyromegaly or JVD noted.  No lower extremity edema. 2/4 pulses in all 4 extremities. Respiratory: Mild diffuse wheezing bilaterally. Good inspiratory effort. Abdomen: Soft nontender nondistended with normal bowel sounds x4 quadrants. Muskuloskeletal: No cyanosis, clubbing or edema noted bilaterally Neuro: CN II-XII intact, strength, sensation, reflexes Skin: No ulcerative lesions noted or rashes Psychiatry: Judgement and insight appear normal. Mood is appropriate for condition and setting          Labs on Admission:  Basic Metabolic Panel: Recent Labs  Lab 08/16/21 1138  NA 143  K 3.9  CL 100  CO2 34*  GLUCOSE 117*  BUN 8  CREATININE 0.70  CALCIUM 8.2*   Liver Function Tests: No results for input(s): AST, ALT, ALKPHOS, BILITOT, PROT, ALBUMIN in the last 168 hours. No results for input(s): LIPASE, AMYLASE in the last 168 hours. No results for input(s): AMMONIA in the last 168 hours. CBC: Recent Labs  Lab 08/16/21 1138  WBC 6.5  HGB 13.7  HCT 42.8  MCV 91.5  PLT 181   Cardiac Enzymes: No results for input(s): CKTOTAL, CKMB, CKMBINDEX, TROPONINI in the last 168 hours.  BNP (last 3 results) Recent Labs    06/26/21 0637  BNP 152.7*    ProBNP (last 3 results) No results for input(s): PROBNP in the last 8760 hours.  CBG: No results for input(s): GLUCAP in the last 168 hours.  Radiological Exams  on Admission: DG Chest 2 View  Result Date: 08/16/2021 CLINICAL DATA:  Shortness of breath. EXAM: CHEST - 2 VIEW COMPARISON:  06/25/2021 FINDINGS: Heart size is normal. No pleural effusion or edema. No airspace opacities identified. Chronic interstitial coarsening identified bilaterally. The visualized osseous structures are unremarkable. IMPRESSION: No active cardiopulmonary disease. Electronically Signed   By: 06/27/2021 M.D.   On: 08/16/2021 12:35    EKG: I independently viewed the EKG done and my findings are as followed: Sinus rhythm rate of 74.  Nonspecific ST-T changes.  QTc 421.  Assessment/Plan Present on Admission:  Acute exacerbation of chronic obstructive pulmonary disease (COPD) (HCC)  Active Problems:   Acute exacerbation of chronic obstructive pulmonary disease (COPD) (HCC)  Acute COPD exacerbation in the setting of ongoing tobacco use Presented with dyspnea with minimal exertion times a few days associated with productive cough, yellow sputum. Chest x-ray no  acute findings. Start IV Solu-Medrol 40 mg twice daily Start Z-Pak for his anti-inflammatory properties Bronchodilators Antitussives Maintain O2 saturation greater than 90%.  Acute hypoxic respiratory failure secondary to acute COPD exacerbation. Not on oxygen supplementation at baseline On admission she was on 6 L O2 saturation 98% Incentive spirometer and bronchodilators Wean off oxygen supplementation as tolerated Home oxygen evaluation 08/17/2021 DME oxygen if needed. TOC consulted to assist with DC planning, DME oxygen supplementation, if required.  Lower back pain, polyuria Obtain UA and urine culture, follow results. Treat if positive for pyuria. Pain control as needed  Hypothyroidism Obtain TSH Unclear when she last took home levothyroxine. Resume home levothyroxine  Chronic anxiety/depression Resume home Paxil and Remeron  Hyperlipidemia Resume home simvastatin.  Tobacco use disorder,  ongoing tobacco use Tobacco cessation counseling Nicotine patch  Medication noncompliance Unclear when she last took her levothyroxine Medical compliance counseling.   DVT prophylaxis: Subcu Lovenox daily  Code Status: Full code  Family Communication: None at bedside.  Disposition Plan: Admit to MedSurg unit with remote telemetry.  Consults called: None  Admission status: Observation status.   Status is: Observation   Dispo:  Patient From:  Home  Planned Disposition:  Home  Medically stable for discharge:  No       Darlin Drop MD [facility] Pager [phone]  If 7PM-7AM, please contact night-coverage [email or web address] Password [facility]  08/16/2021, 4:04 PM

## 2021-08-16 NOTE — ED Triage Notes (Signed)
Patient c/o SOB and productive cough with brownish sputum.

## 2021-08-16 NOTE — ED Notes (Signed)
Pt drops to 82% sat while walking. Steady. Denies SOB. Cough present after neb treatment. Provider Enoch notified via secure chat. Pt back to stretcher and placed back on 2L O2 via [place].

## 2021-08-16 NOTE — ED Notes (Signed)
Messaged provider McHugh via secure chat to confirm whether wanting 2nd trop or not. Awaiting reply.

## 2021-08-16 NOTE — ED Notes (Addendum)
See triage note  presents with some SOB and wheezing    also has had some cough  states cough is productive.  No fever  states she uses inhalers at home

## 2021-08-16 NOTE — ED Provider Notes (Signed)
[place]  [facility]  ____________________________________________   Event Date/Time   First MD Initiated Contact with Patient 08/16/21 1229     (approximate)  I have reviewed the triage vital signs and the nursing notes.   HISTORY  Chief Complaint Shortness of Breath    HPI Caroline Coleman is a 65 y.o. female past medical history of COPD, anxiety, depression who presents with shortness of breath.  Several days ago.  She endorses diffuse wheezing and cough productive of brown sputum.  She denies any chest pain.  Has had chills but no fevers.  Denies abdominal pain, nausea vomiting.  Patient was admitted for At the end of July.  She then saw her primary doctor several weeks ago and was prescribed another course of antibiotics.  This relieved her symptoms until recently. The patient denies hx of prior DVT/PE, unilateral leg pain/swelling, hormone use, recent surgery, hx of cancer, prolonged immobilization, or hemoptysis.           Past Medical History:  Diagnosis Date   Anxiety    COPD (chronic obstructive pulmonary disease) (HCC)    Depression    Thyroid disease     Patient Active Problem List   Diagnosis Date Noted   Acute exacerbation of chronic obstructive pulmonary disease (COPD) (HCC) 08/16/2021   CAP (community acquired pneumonia) 06/25/2021   HLD (hyperlipidemia) 06/25/2021   Depression with anxiety 06/25/2021   Tobacco abuse 06/25/2021   Acute respiratory failure with hypoxia (HCC) 06/25/2021   COPD exacerbation (HCC) 01/27/2021   Nicotine dependence 01/27/2021   Acute respiratory failure (HCC) 08/24/2018   Self-inflicted laceration of wrist (HCC) 08/03/2016   Substance induced mood disorder (HCC) 08/03/2016   Alcohol abuse 08/03/2016   Panic disorder with agoraphobia 12/14/2015   Insomnia 12/14/2015   Depression 12/14/2015   Acquired hypothyroidism 05/04/2015   Colon polyps 05/04/2015   COPD, moderate (HCC) 05/04/2015   Edentulous  05/04/2015   Family history of early CAD 05/04/2015   Generalized anxiety disorder 05/04/2015   Goiter 05/04/2015   Pure hypercholesterolemia 05/04/2015   S/P lens implant 05/04/2015    Past Surgical History:  Procedure Laterality Date   EYE SURGERY     HIP SURGERY      Prior to Admission medications   Medication Sig Start Date End Date Taking? Authorizing Provider  ADVAIR DISKUS 250-50 MCG/ACT AEPB Inhale 1 puff into the lungs 2 (two) times daily. 04/06/21   [provider]  albuterol (VENTOLIN HFA) 108 (90 Base) MCG/ACT inhaler Inhale 2 puffs into the lungs every 6 (six) hours as needed for wheezing or shortness of breath. 07/03/19   Concha Se, MD  gabapentin (NEURONTIN) 400 MG capsule Take 1 capsule (400 mg total) by mouth 2 (two) times daily. Patient taking differently: Take 800 mg by mouth 4 (four) times daily. 06/06/17 06/25/21  Myrna Blazer, MD  levothyroxine (SYNTHROID) 150 MCG tablet Take 1 tablet (150 mcg total) by mouth daily. Patient not taking: Reported on 06/25/2021 07/27/17 06/25/21  Tommi Rumps, PA-C  mirtazapine (REMERON SOL-TAB) 15 MG disintegrating tablet Take 15 mg by mouth at bedtime. 06/01/21   [provider]  PARoxetine (PAXIL) 40 MG tablet Take 1 tablet (40 mg total) by mouth daily. 07/27/17 06/25/21  Tommi Rumps, PA-C  simvastatin (ZOCOR) 40 MG tablet Take 40 mg by mouth daily. 10/01/20   [provider]  Laurence Compton 2.5-2.5 MCG/ACT AERS SMARTSIG:2 Puff(s) Via Inhaler Daily 04/25/21   [provider]  umeclidinium-vilanterol (ANORO ELLIPTA) 62.5-25 MCG/INH AEPB Inhale 1 puff into the lungs daily. 01/28/21   Almon Hercules, MD    Allergies Penicillins and Sulfa antibiotics  Family History  Problem Relation Age of Onset   Breast cancer Mother    Heart attack Father    Heart attack Brother     Social History Social History   Tobacco Use   Smoking status: Every Day    Packs/day: 0.50    Years: 45.00     Pack years: 22.50    Types: Cigarettes   Smokeless tobacco: Current  Vaping Use   Vaping Use: Never used  Substance Use Topics   Alcohol use: No   Drug use: No    Review of Systems   Review of Systems  Constitutional:  Positive for chills and fatigue. Negative for fever.  Respiratory:  Positive for cough and shortness of breath. Negative for chest tightness.   Cardiovascular:  Negative for chest pain and leg swelling.  Gastrointestinal:  Negative for abdominal pain, nausea and vomiting.  All other systems reviewed and are negative.  Physical Exam Updated Vital Signs BP (!) 110/55 (BP Location: Right Arm)   Pulse 89   Temp 98.5 F (36.9 C) (Oral)   Resp 18   Ht 5\' 4"  (1.626 m)   Wt 79.4 kg   SpO2 91%   BMI 30.04 kg/m   Physical Exam Vitals and nursing note reviewed.  Constitutional:      General: She is not in acute distress.    Appearance: Normal appearance.  HENT:     Head: Normocephalic and atraumatic.  Eyes:     General: No scleral icterus.    Conjunctiva/sclera: Conjunctivae normal.  Pulmonary:     Effort: Pulmonary effort is normal. No respiratory distress.     Breath sounds: No stridor.     Comments: Expiratory wheezing throughout, patient is in no respiratory distress, able to speak in full sentences Musculoskeletal:        General: No deformity or signs of injury.     Cervical back: Normal range of motion.  Skin:    General: Skin is dry.     Coloration: Skin is not jaundiced or pale.  Neurological:     General: No focal deficit present.     Mental Status: She is alert and oriented to person, place, and time. Mental status is at baseline.  Psychiatric:        Mood and Affect: Mood normal.        Behavior: Behavior normal.     LABS (all labs ordered are listed, but only abnormal results are displayed)  Labs Reviewed  BASIC METABOLIC PANEL - Abnormal; Notable for the following components:      Result Value   CO2 34 (*)    Glucose, Bld 117  (*)    Calcium 8.2 (*)    All other components within normal limits  CBC - Abnormal; Notable for the following components:   RDW 15.9 (*)    All other components within normal limits  RESP PANEL BY RT-PCR (FLU A&B, COVID) ARPGX2  TROPONIN I (HIGH SENSITIVITY)   ____________________________________________  EKG  Normal sinus rhythm, right axis deviation, normal intervals, no acute ischemic changes ____________________________________________  RADIOLOGY , personally viewed and evaluated these images (plain radiographs) as part of my medical decision making, as well as reviewing the written report by the radiologist.  ED MD interpretation: I reviewed the chest x-ray which does not show  any acute infiltrate    ____________________________________________   PROCEDURES  Procedure(s) performed (including Critical Care):  Procedures   ____________________________________________   INITIAL IMPRESSION / ASSESSMENT AND PLAN / ED COURSE     65 year old female with a history of COPD not on oxygen presents with cough and shortness of breath.  She has a 2 L nasal cannula oxygen requirement at this time.  She is not in any resp distress but is wheezing significantly.  Will treat as COPD exacerbation with steroids and duo nebs.  Patient's status post DuoNeb x3.  Feeling somewhat improved.  Still has some wheezing.  She is notably hypoxic to 83% with ambulation.  She requires 2 L nasal cannula at rest.  Given her persistent hypoxia will admit for ongoing management.      ____________________________________________   FINAL CLINICAL IMPRESSION(S) / ED DIAGNOSES  Final diagnoses:  COPD exacerbation (HCC)  Acute respiratory failure with hypoxia [facility])     ED Discharge Orders     None        Note:  This document was prepared using Dragon voice recognition software and may include unintentional dictation errors.    Georga Hacking, MD 08/16/21  1600

## 2021-08-17 DIAGNOSIS — E039 Hypothyroidism, unspecified: Secondary | ICD-10-CM | POA: Diagnosis present

## 2021-08-17 DIAGNOSIS — R350 Frequency of micturition: Secondary | ICD-10-CM | POA: Diagnosis present

## 2021-08-17 DIAGNOSIS — E785 Hyperlipidemia, unspecified: Secondary | ICD-10-CM | POA: Diagnosis present

## 2021-08-17 DIAGNOSIS — Z79890 Hormone replacement therapy (postmenopausal): Secondary | ICD-10-CM | POA: Diagnosis not present

## 2021-08-17 DIAGNOSIS — F32A Depression, unspecified: Secondary | ICD-10-CM | POA: Diagnosis present

## 2021-08-17 DIAGNOSIS — F411 Generalized anxiety disorder: Secondary | ICD-10-CM | POA: Diagnosis present

## 2021-08-17 DIAGNOSIS — Z20822 Contact with and (suspected) exposure to covid-19: Secondary | ICD-10-CM | POA: Diagnosis present

## 2021-08-17 DIAGNOSIS — G629 Polyneuropathy, unspecified: Secondary | ICD-10-CM | POA: Diagnosis present

## 2021-08-17 DIAGNOSIS — J441 Chronic obstructive pulmonary disease with (acute) exacerbation: Secondary | ICD-10-CM | POA: Diagnosis present

## 2021-08-17 DIAGNOSIS — J9601 Acute respiratory failure with hypoxia: Secondary | ICD-10-CM | POA: Diagnosis present

## 2021-08-17 DIAGNOSIS — Z79899 Other long term (current) drug therapy: Secondary | ICD-10-CM | POA: Diagnosis not present

## 2021-08-17 DIAGNOSIS — Z9114 Patient's other noncompliance with medication regimen: Secondary | ICD-10-CM | POA: Diagnosis not present

## 2021-08-17 DIAGNOSIS — Z7951 Long term (current) use of inhaled steroids: Secondary | ICD-10-CM | POA: Diagnosis not present

## 2021-08-17 DIAGNOSIS — Z8701 Personal history of pneumonia (recurrent): Secondary | ICD-10-CM | POA: Diagnosis not present

## 2021-08-17 DIAGNOSIS — F17210 Nicotine dependence, cigarettes, uncomplicated: Secondary | ICD-10-CM | POA: Diagnosis present

## 2021-08-17 DIAGNOSIS — M545 Low back pain, unspecified: Secondary | ICD-10-CM | POA: Diagnosis present

## 2021-08-17 DIAGNOSIS — E7800 Pure hypercholesterolemia, unspecified: Secondary | ICD-10-CM | POA: Diagnosis present

## 2021-08-17 LAB — MAGNESIUM: Magnesium: 2.5 mg/dL — ABNORMAL HIGH (ref 1.7–2.4)

## 2021-08-17 LAB — CBC
HCT: 40.9 % (ref 36.0–46.0)
Hemoglobin: 13.1 g/dL (ref 12.0–15.0)
MCH: 28.7 pg (ref 26.0–34.0)
MCHC: 32 g/dL (ref 30.0–36.0)
MCV: 89.7 fL (ref 80.0–100.0)
Platelets: 221 10*3/uL (ref 150–400)
RBC: 4.56 MIL/uL (ref 3.87–5.11)
RDW: 15.9 % — ABNORMAL HIGH (ref 11.5–15.5)
WBC: 7.9 10*3/uL (ref 4.0–10.5)
nRBC: 0 % (ref 0.0–0.2)

## 2021-08-17 LAB — BASIC METABOLIC PANEL
Anion gap: 9 (ref 5–15)
BUN: 13 mg/dL (ref 8–23)
CO2: 30 mmol/L (ref 22–32)
Calcium: 8.9 mg/dL (ref 8.9–10.3)
Chloride: 102 mmol/L (ref 98–111)
Creatinine, Ser: 0.74 mg/dL (ref 0.44–1.00)
GFR, Estimated: >60 mL/min (ref >60)
Glucose, Bld: 124 mg/dL — ABNORMAL HIGH (ref 70–99)
Potassium: 4.5 mmol/L (ref 3.5–5.1)
Sodium: 141 mmol/L (ref 135–145)

## 2021-08-17 LAB — PROCALCITONIN: Procalcitonin: <0.1 ng/mL

## 2021-08-17 LAB — TSH: TSH: 0.539 u[IU]/mL (ref 0.350–4.500)

## 2021-08-17 MED ORDER — GABAPENTIN 400 MG PO CAPS
800.0000 mg | ORAL_CAPSULE | Freq: Four times a day (QID) | ORAL | Status: DC
  Administered 2021-08-17 – 2021-08-19 (×9): 800 mg via ORAL
  Filled 2021-08-17 (×9): qty 2

## 2021-08-17 NOTE — Progress Notes (Signed)
PROGRESS NOTE    Caroline Coleman  ENI:[id number] DOB: 02-12-1956 DOA: 08/16/2021 PCP: Hillery Aldo, MD   Brief Narrative: This 65 years old female with PMH significant for multiple admissions for COPD exacerbation, recently discharged in July 2022 for same, ongoing tobacco use, COPD, hypothyroidism, chronic anxiety and depression, hyperlipidemia, peripheral neuropathy presented in the ED with worsening shortness of breath for last 2 days.  Patient reports shortness of breath is associated with productive cough, denies any chest pain. Patient was wheezing and was  hypoxic on ambulation in the ED, O2 saturation 80% on room air.  Patient was given DuoNeb and Solu-Medrol in the ED.  Patient felt better. Patient is admitted for COPD exacerbation.  Assessment & Plan:   Active Problems:   Acute exacerbation of chronic obstructive pulmonary disease (COPD) (HCC)  Acute hypoxic respiratory failure secondary to COPD exacerbation: Patient presented with shortness of breath with minimal exertion associated with productive cough, yellow phlegm.  Patient is now requiring 2 L of oxygen sats 94%.  Not on oxygen at baseline. Chest x-ray shows:  no acute finding. Continue IV Solu-Medrol 40 mg twice daily Continue Zithromax 500 mg IV daily for 3 days Continue DuoNeb nebulization as needed. Continue Mucinex, incentive spirometry Wean off oxygen as tolerated Evaluate for home oxygen on 9/21.  Low back pain: Patient complains of increased urinary frequency. UA : Unremarkable.  Follow-up urine culture  Hypothyroidism: Continue levothyroxine  Anxiety and depression: Continue Paxil and Remeron  Hyperlipidemia: Continue simvastatin  Tobacco use disorder: Tobacco cessation counseling completed  Medication noncompliance: Counseling completed  DVT prophylaxis: Lovenox Code Status: Full code Family Communication: No family at bedside Disposition Plan:   Status is: Inpatient  Remains inpatient  appropriate because:Inpatient level of care appropriate due to severity of illness  Dispo:  Patient From: Home  Planned Disposition: Home  Medically stable for discharge: No     Consultants:  None  Procedures: None Antimicrobials:   Anti-infectives (From admission, onward)    Start     Dose/Rate Route Frequency Ordered Stop   08/17/21 1000  azithromycin (ZITHROMAX) tablet 250 mg       See Hyperspace for full Linked Orders Report.   250 mg Oral Daily 08/16/21 1614 08/21/21 0959   08/16/21 1700  azithromycin (ZITHROMAX) tablet 500 mg       See Hyperspace for full Linked Orders Report.   500 mg Oral Daily 08/16/21 1614 08/16/21 1907        Subjective: Patient was seen and examined at bedside.  Overnight events noted.   Patient reports feeling slightly better , still has significant wheezing and coughing.  Objective: Vitals:   08/17/21 0518 08/17/21 0620 08/17/21 1051 08/17/21 1508  BP: (!) 92/49 (!) 93/53 (!) 107/54 110/60  Pulse: 74 72 84 78  Resp: 18 20 20 17   Temp: 98.2 F (36.8 C)   98.6 F (37 C)  TempSrc: Oral   Oral  SpO2: 95% 96% 95% 94%  Weight:      Height:       No intake or output data in the 24 hours ending 08/17/21 1531 Filed Weights   08/16/21 1117  Weight: 79.4 kg    Examination:  General exam: Appears comfortable, not in any acute distress. Respiratory system: Wheezing noted bilaterally, respiratory effort normal.  RR 17 Cardiovascular system: S1-S2 heard, regular rate and rhythm, no murmur. Gastrointestinal system: Abdomen is soft, nontender, nondistended, BS+, Central nervous system: Alert and oriented X 3. No focal neurological deficits.  Extremities: No edema, no cyanosis, no clubbing. Skin: No rashes, lesions or ulcers Psychiatry: Judgement and insight appear normal. Mood & affect appropriate.    Data Reviewed: I have personally reviewed following labs and imaging studies  CBC: Recent Labs  Lab 08/16/21 1138 08/17/21 0520  WBC 6.5  7.9  HGB 13.7 13.1  HCT 42.8 40.9  MCV 91.5 89.7  PLT 181 221   Basic Metabolic Panel: Recent Labs  Lab 08/16/21 1138 08/17/21 0520  NA 143 141  K 3.9 4.5  CL 100 102  CO2 34* 30  GLUCOSE 117* 124*  BUN 8 13  CREATININE 0.70 0.74  CALCIUM 8.2* 8.9  MG  --  2.5*   GFR: Estimated Creatinine Clearance: 72.5 mL/min (by C-G formula based on SCr of 0.74 mg/dL). Liver Function Tests: No results for input(s): AST, ALT, ALKPHOS, BILITOT, PROT, ALBUMIN in the last 168 hours. No results for input(s): LIPASE, AMYLASE in the last 168 hours. No results for input(s): AMMONIA in the last 168 hours. Coagulation Profile: No results for input(s): INR, PROTIME in the last 168 hours. Cardiac Enzymes: No results for input(s): CKTOTAL, CKMB, CKMBINDEX, TROPONINI in the last 168 hours. BNP (last 3 results) No results for input(s): PROBNP in the last 8760 hours. HbA1C: No results for input(s): HGBA1C in the last 72 hours. CBG: No results for input(s): GLUCAP in the last 168 hours. Lipid Profile: No results for input(s): CHOL, HDL, LDLCALC, TRIG, CHOLHDL, LDLDIRECT in the last 72 hours. Thyroid Function Tests: Recent Labs    08/17/21 0520  TSH 0.539   Anemia Panel: No results for input(s): VITAMINB12, FOLATE, FERRITIN, TIBC, IRON, RETICCTPCT in the last 72 hours. Sepsis Labs: Recent Labs  Lab 08/17/21 0520  PROCALCITON <0.10    Recent Results (from the past 240 hour(s))  Resp Panel by RT-PCR (Flu A&B, Covid) Nasopharyngeal Swab     Status: None   Collection Time: 08/16/21  1:07 PM   Specimen: Nasopharyngeal Swab; Nasopharyngeal(NP) swabs in vial transport medium  Result Value Ref Range Status   SARS Coronavirus 2 by RT PCR NEGATIVE NEGATIVE Final    Comment: (NOTE) SARS-CoV-2 target nucleic acids are NOT DETECTED.  The SARS-CoV-2 RNA is generally detectable in upper respiratory specimens during the acute phase of infection. The lowest concentration of SARS-CoV-2 viral copies  this assay can detect is 138 copies/mL. A negative result does not preclude SARS-Cov-2 infection and should not be used as the sole basis for treatment or other patient management decisions. A negative result may occur with  improper specimen collection/handling, submission of specimen other than nasopharyngeal swab, presence of viral mutation(s) within the areas targeted by this assay, and inadequate number of viral copies(<138 copies/mL). A negative result must be combined with clinical observations, patient history, and epidemiological information. The expected result is Negative.  Fact Sheet for Patients:  BloggerCourse.com  Fact Sheet for Healthcare Providers:  SeriousBroker.it  This test is no t yet approved or cleared by the [place] FDA and  has been authorized for detection and/or diagnosis of SARS-CoV-2 by FDA under an Emergency Use Authorization (EUA). This EUA will remain  in effect (meaning this test can be used) for the duration of the COVID-19 declaration under Section 564(b)(1) of the Act, 21 U.S.C.section 360bbb-3(b)(1), unless the authorization is terminated  or revoked sooner.       Influenza A by PCR NEGATIVE NEGATIVE Final   Influenza B by PCR NEGATIVE NEGATIVE Final    Comment: (NOTE) The Xpert Xpress  SARS-CoV-2/FLU/RSV plus assay is intended as an aid in the diagnosis of influenza from Nasopharyngeal swab specimens and should not be used as a sole basis for treatment. Nasal washings and aspirates are unacceptable for Xpert Xpress SARS-CoV-2/FLU/RSV testing.  Fact Sheet for Patients: BloggerCourse.com  Fact Sheet for Healthcare Providers: SeriousBroker.it  This test is not yet approved or cleared by the [place] FDA and has been authorized for detection and/or diagnosis of SARS-CoV-2 by FDA under an Emergency Use Authorization (EUA). This EUA  will remain in effect (meaning this test can be used) for the duration of the COVID-19 declaration under Section 564(b)(1) of the Act, 21 U.S.C. section 360bbb-3(b)(1), unless the authorization is terminated or revoked.  Performed at [facility], [address] [place], [place] [address]      Radiology Studies: DG Chest 2 View  Result Date: 08/16/2021 CLINICAL DATA:  Shortness of breath. EXAM: CHEST - 2 VIEW COMPARISON:  06/25/2021 FINDINGS: Heart size is normal. No pleural effusion or edema. No airspace opacities identified. Chronic interstitial coarsening identified bilaterally. The visualized osseous structures are unremarkable. IMPRESSION: No active cardiopulmonary disease. Electronically Signed   By: Signa Kell M.D.   On: 08/16/2021 12:35     Scheduled Meds:  azithromycin  250 mg Oral Daily   enoxaparin (LOVENOX) injection  40 mg Subcutaneous QHS   gabapentin  800 mg Oral QID   ipratropium-albuterol  3 mL Nebulization Q6H   levothyroxine  75 mcg Oral Q0600   methylPREDNISolone (SOLU-MEDROL) injection  40 mg Intravenous Q12H   mirtazapine  15 mg Oral QHS   nicotine  14 mg Transdermal Daily   PARoxetine  40 mg Oral Daily   simvastatin  40 mg Oral QHS   umeclidinium-vilanterol  1 puff Inhalation Daily   Continuous Infusions:   LOS: 0 days    Time spent: 35 mins    Edeline Greening, MD [facility]   If 7PM-7AM, please contact night-coverage prog

## 2021-08-17 NOTE — ED Notes (Signed)
Up in room    denies nay complaints at present

## 2021-08-17 NOTE — Progress Notes (Signed)
Patient arrived to floor from ED.  Patient oriented to room and call bell. Initiated telemetry and assisted patient to order meal.

## 2021-08-18 MED ORDER — PREDNISONE 20 MG PO TABS
50.0000 mg | ORAL_TABLET | Freq: Every day | ORAL | Status: DC
  Administered 2021-08-18 – 2021-08-19 (×2): 50 mg via ORAL
  Filled 2021-08-18: qty 3

## 2021-08-18 MED ORDER — HYDROXYZINE HCL 25 MG PO TABS
25.0000 mg | ORAL_TABLET | Freq: Three times a day (TID) | ORAL | Status: DC | PRN
  Administered 2021-08-18 (×2): 25 mg via ORAL
  Filled 2021-08-18 (×2): qty 1

## 2021-08-18 MED ORDER — LEVOTHYROXINE SODIUM 137 MCG PO TABS
137.0000 ug | ORAL_TABLET | Freq: Every day | ORAL | Status: DC
  Administered 2021-08-19: 137 ug via ORAL
  Filled 2021-08-18: qty 1

## 2021-08-18 MED ORDER — UMECLIDINIUM-VILANTEROL 62.5-25 MCG/INH IN AEPB: 1.0000 | INHALATION_SPRAY | Freq: Every day | RESPIRATORY_TRACT | Status: DC

## 2021-08-18 NOTE — Progress Notes (Signed)
Mobility Specialist - Progress Note   08/18/21 1000  Mobility  Activity Ambulated in hall  Level of Assistance Standby assist, set-up cues, supervision of patient - no hands on  Assistive Device None  Distance Ambulated (ft) 160 ft  Mobility Ambulated with assistance in hallway  Mobility Response Tolerated well  Mobility performed by Mobility specialist  $Mobility charge 1 Mobility    O2 while resting on RA = 91% O2 while AMB on RA = 83% O2 while AMB on 3L = 93%  Pt utilizing 4L on arrival. O2 sats recorded above. Ambulated in hallway with supervision. No LOB. Does voice chest tightness during activity, denies pain. RN notified. HR ranging 78-90 bpm. Winded post-activity. RPE 7/10. Returned to 4L prior to exit.    Filiberto Pinks Mobility Specialist 08/18/21, 11:41 AM

## 2021-08-18 NOTE — TOC CM/SW Note (Addendum)
Patient will discharge on home oxygen. Ordered through [employer]. Patient is aware. She is aware of plan to discharge tomorrow and said she will need a ride home. Will set up [employer]. Patient said she moves independently so can set up Uber/Lyft through them. Address on facesheet is correct.  Charlynn Court, CSW [phone]  2:56 pm: [employer] will deliver oxygen to the room in the morning.  Charlynn Court, CSW [phone]

## 2021-08-18 NOTE — Progress Notes (Signed)
PROGRESS NOTE    Caroline Coleman  ZSW:[id number] DOB: [birth year] DOA: 08/16/2021 PCP: Hillery Aldo, MD     Brief Narrative:  Caroline Coleman is a 65 years old female with PMH significant for multiple admissions for COPD exacerbation, recently discharged in July 2022 for same, ongoing tobacco use, COPD, hypothyroidism, chronic anxiety and depression, hyperlipidemia, peripheral neuropathy presented in the ED with worsening shortness of breath for last 2 days.  Patient reports shortness of breath is associated with productive cough, denies any chest pain. Patient was wheezing and was hypoxic on ambulation in the ED, O2 saturation 80% on room air.  Patient was given DuoNeb and Solu-Medrol in the ED.  Patient felt better. Patient is admitted for COPD exacerbation.  New events last 24 hours / Subjective: Admits to significant anxiety, states that she was on Xanax as an outpatient at one point but is no longer receiving prescriptions for it as outpatient.  Also admits to some chest tightness, continued wheezing, on 4 L nasal cannula O2 this morning.  Assessment & Plan:   Active Problems:   Acute exacerbation of chronic obstructive pulmonary disease (COPD) (HCC)   Acute hypoxic respiratory failure secondary to COPD exacerbation -Presented with O2 saturation in the 80s on room air -Currently requiring 4 L nasal cannula O2, wean to room air as able, home oxygen evaluation today -De-escalate steroids to prednisone -Continue azithromycin -Continue breathing treatments  Anxiety -Continue Paxil, hydroxyzine as needed  Hypothyroidism -Continue Synthroid  Hyperlipidemia -Continue simvastatin  Tobacco use disorder -Cessation counseling  DVT prophylaxis:  enoxaparin (LOVENOX) injection 40 mg Start: 08/16/21 2200 SCDs Start: 08/16/21 1602  Code Status:     Code Status Orders  (From admission, onward)           Start     Ordered   08/16/21 1602  Full code  Continuous         08/16/21 1601           Code Status History     Date Active Date Inactive Code Status Order ID Comments User Context   06/25/2021 1556 06/27/2021 1848 Full Code [id number]  Lorretta Harp, MD ED   01/27/2021 0832 01/28/2021 2124 Full Code [id number]  Lucile Shutters, MD ED   08/24/2018 0206 08/26/2018 1241 Full Code [id number]  Cammy Copa, MD Inpatient      Family Communication: No family at bedside Disposition Plan:  Status is: Inpatient  Remains inpatient appropriate because:Inpatient level of care appropriate due to severity of illness  Dispo:  Patient From: Home  Planned Disposition: Home  Medically stable for discharge: No       Antimicrobials:  Anti-infectives (From admission, onward)    Start     Dose/Rate Route Frequency Ordered Stop   08/17/21 1000  azithromycin (ZITHROMAX) tablet 250 mg       See Hyperspace for full Linked Orders Report.   250 mg Oral Daily 08/16/21 1614 08/21/21 0959   08/16/21 1700  azithromycin (ZITHROMAX) tablet 500 mg       See Hyperspace for full Linked Orders Report.   500 mg Oral Daily 08/16/21 1614 08/16/21 1907        Objective: Vitals:   08/17/21 2037 08/18/21 0510 08/18/21 0730 08/18/21 0826  BP: (!) 94/56 (!) 91/58  (!) 86/53  Pulse: 77 64  66  Resp: 16 16  16   Temp: 98.2 F (36.8 C) 97.6 F (36.4 C)  98.2 F (36.8 C)  TempSrc: Oral Oral  Oral  SpO2:  99% 98% 100% 97%  Weight:      Height:       No intake or output data in the 24 hours ending 08/18/21 1054 Filed Weights   08/16/21 1117  Weight: 79.4 kg    Examination:  General exam: Appears calm and comfortable  Respiratory system: + Bilateral rhonchi, no respiratory distress, no conversational dyspnea, on 4 L oxygen Cardiovascular system: S1 & S2 heard, RRR. No murmurs. No pedal edema. Gastrointestinal system: Abdomen is nondistended, soft and nontender. Normal bowel sounds heard. Central nervous system: Alert and oriented. No focal neurological deficits. Speech  clear.  Extremities: Symmetric in appearance  Skin: No rashes, lesions or ulcers on exposed skin  Psychiatry: Judgement and insight appear normal. Mood & affect appropriate.   Data Reviewed: I have personally reviewed following labs and imaging studies  CBC: Recent Labs  Lab 08/16/21 1138 08/17/21 0520  WBC 6.5 7.9  HGB 13.7 13.1  HCT 42.8 40.9  MCV 91.5 89.7  PLT 181 221   Basic Metabolic Panel: Recent Labs  Lab 08/16/21 1138 08/17/21 0520  NA 143 141  K 3.9 4.5  CL 100 102  CO2 34* 30  GLUCOSE 117* 124*  BUN 8 13  CREATININE 0.70 0.74  CALCIUM 8.2* 8.9  MG  --  2.5*   GFR: Estimated Creatinine Clearance: 72.5 mL/min (by C-G formula based on SCr of 0.74 mg/dL). Liver Function Tests: No results for input(s): AST, ALT, ALKPHOS, BILITOT, PROT, ALBUMIN in the last 168 hours. No results for input(s): LIPASE, AMYLASE in the last 168 hours. No results for input(s): AMMONIA in the last 168 hours. Coagulation Profile: No results for input(s): INR, PROTIME in the last 168 hours. Cardiac Enzymes: No results for input(s): CKTOTAL, CKMB, CKMBINDEX, TROPONINI in the last 168 hours. BNP (last 3 results) No results for input(s): PROBNP in the last 8760 hours. HbA1C: No results for input(s): HGBA1C in the last 72 hours. CBG: No results for input(s): GLUCAP in the last 168 hours. Lipid Profile: No results for input(s): CHOL, HDL, LDLCALC, TRIG, CHOLHDL, LDLDIRECT in the last 72 hours. Thyroid Function Tests: Recent Labs    08/17/21 0520  TSH 0.539   Anemia Panel: No results for input(s): VITAMINB12, FOLATE, FERRITIN, TIBC, IRON, RETICCTPCT in the last 72 hours. Sepsis Labs: Recent Labs  Lab 08/17/21 0520  PROCALCITON <0.10    Recent Results (from the past 240 hour(s))  Resp Panel by RT-PCR (Flu A&B, Covid) Nasopharyngeal Swab     Status: None   Collection Time: 08/16/21  1:07 PM   Specimen: Nasopharyngeal Swab; Nasopharyngeal(NP) swabs in vial transport medium   Result Value Ref Range Status   SARS Coronavirus 2 by RT PCR NEGATIVE NEGATIVE Final    Comment: (NOTE) SARS-CoV-2 target nucleic acids are NOT DETECTED.  The SARS-CoV-2 RNA is generally detectable in upper respiratory specimens during the acute phase of infection. The lowest concentration of SARS-CoV-2 viral copies this assay can detect is 138 copies/mL. A negative result does not preclude SARS-Cov-2 infection and should not be used as the sole basis for treatment or other patient management decisions. A negative result may occur with  improper specimen collection/handling, submission of specimen other than nasopharyngeal swab, presence of viral mutation(s) within the areas targeted by this assay, and inadequate number of viral copies(<138 copies/mL). A negative result must be combined with clinical observations, patient history, and epidemiological information. The expected result is Negative.  Fact Sheet for Patients:  BloggerCourse.com  Fact Sheet for Healthcare  Providers:  SeriousBroker.it  This test is no t yet approved or cleared by the [place] and  has been authorized for detection and/or diagnosis of SARS-CoV-2 by FDA under an Emergency Use Authorization (EUA). This EUA will remain  in effect (meaning this test can be used) for the duration of the COVID-19 declaration under Section 564(b)(1) of the Act, 21 U.S.C.section 360bbb-3(b)(1), unless the authorization is terminated  or revoked sooner.       Influenza A by PCR NEGATIVE NEGATIVE Final   Influenza B by PCR NEGATIVE NEGATIVE Final    Comment: (NOTE) The Xpert Xpress SARS-CoV-2/FLU/RSV plus assay is intended as an aid in the diagnosis of influenza from Nasopharyngeal swab specimens and should not be used as a sole basis for treatment. Nasal washings and aspirates are unacceptable for Xpert Xpress SARS-CoV-2/FLU/RSV testing.  Fact Sheet for  Patients: BloggerCourse.com  Fact Sheet for Healthcare Providers: SeriousBroker.it  This test is not yet approved or cleared by the [place] FDA and has been authorized for detection and/or diagnosis of SARS-CoV-2 by FDA under an Emergency Use Authorization (EUA). This EUA will remain in effect (meaning this test can be used) for the duration of the COVID-19 declaration under Section 564(b)(1) of the Act, 21 U.S.C. section 360bbb-3(b)(1), unless the authorization is terminated or revoked.  Performed at [facility], [address], [place], [place] [address]       Radiology Studies: DG Chest 2 View  Result Date: 08/16/2021 CLINICAL DATA:  Shortness of breath. EXAM: CHEST - 2 VIEW COMPARISON:  06/25/2021 FINDINGS: Heart size is normal. No pleural effusion or edema. No airspace opacities identified. Chronic interstitial coarsening identified bilaterally. The visualized osseous structures are unremarkable. IMPRESSION: No active cardiopulmonary disease. Electronically Signed   By: Signa Kell M.D.   On: 08/16/2021 12:35      Scheduled Meds:  azithromycin  250 mg Oral Daily   enoxaparin (LOVENOX) injection  40 mg Subcutaneous QHS   gabapentin  800 mg Oral QID   ipratropium-albuterol  3 mL Nebulization Q6H   levothyroxine  75 mcg Oral Q0600   mirtazapine  15 mg Oral QHS   nicotine  14 mg Transdermal Daily   PARoxetine  40 mg Oral Daily   predniSONE  50 mg Oral Q breakfast   simvastatin  40 mg Oral QHS   umeclidinium-vilanterol  1 puff Inhalation Daily   Continuous Infusions:   LOS: 1 day      Time spent: 25 minutes   Noralee Stain, DO [facility] 08/18/2021, 10:54 AM   Available via Epic secure chat 7am-7pm After these hours, please refer to coverage provider listed on [email or web address]

## 2021-08-19 MED ORDER — AZITHROMYCIN 250 MG PO TABS: 250.0000 mg | ORAL_TABLET | Freq: Every day | ORAL | 0 refills | Status: AC

## 2021-08-19 MED ORDER — PREDNISONE 10 MG PO TABS: ORAL_TABLET | ORAL | 0 refills | Status: DC

## 2021-08-19 NOTE — Discharge Summary (Signed)
Physician Discharge Summary  Caroline Coleman WIO:[id number] DOB: [birth year] DOA: 08/16/2021  PCP: Hillery Aldo, MD  Admit date: 08/16/2021 Discharge date: 08/19/2021  Admitted From: Home  Disposition:  Home  Recommendations for Outpatient Follow-up:  Follow up with PCP in 1 week  Discharge Condition: Stable CODE STATUS: Full Diet recommendation:  Diet Orders (From admission, onward)     Start     Ordered   08/16/21 1621  Diet Heart Room service appropriate? Yes; Fluid consistency: Thin  Diet effective now       Question Answer Comment  Room service appropriate? Yes   Fluid consistency: Thin      08/16/21 1620            Brief/Interim Summary: Caroline Coleman is a 65 years old female with PMH significant for multiple admissions for COPD exacerbation, recently discharged in July 2022 for same, ongoing tobacco use, COPD, hypothyroidism, chronic anxiety and depression, hyperlipidemia, peripheral neuropathy presented in the ED with worsening shortness of breath for last 2 days.  Patient reports shortness of breath is associated with productive cough, denies any chest pain. Patient was wheezing and was hypoxic on ambulation in the ED, O2 saturation 80% on room air.  Patient was given DuoNeb and Solu-Medrol in the ED.  Patient felt better. Patient is admitted for COPD exacerbation.  Discharge Diagnoses:  Principal Problem:   Acute exacerbation of chronic obstructive pulmonary disease (COPD) (HCC) Active Problems:   Acquired hypothyroidism   Generalized anxiety disorder   Pure hypercholesterolemia   HLD (hyperlipidemia)   Tobacco abuse   Acute respiratory failure with hypoxia (HCC)  Acute hypoxic respiratory failure secondary to COPD exacerbation -Presented with O2 saturation in the 80s on room air -Currently requiring 3-4 L nasal cannula O2, home O2 ordered -De-escalate steroids to prednisone taper  -Continue azithromycin -Continue breathing  treatments  Anxiety -Continue Paxil  Hypothyroidism -Continue Synthroid  Hyperlipidemia -Continue simvastatin  Tobacco use disorder -Cessation counseling  Discharge Instructions  Discharge Instructions     Call MD for:  difficulty breathing, headache or visual disturbances   Complete by: As directed    Call MD for:  extreme fatigue   Complete by: As directed    Call MD for:  persistant dizziness or light-headedness   Complete by: As directed    Call MD for:  persistant nausea and vomiting   Complete by: As directed    Call MD for:  severe uncontrolled pain   Complete by: As directed    Call MD for:  temperature >100.4   Complete by: As directed    Discharge instructions   Complete by: As directed    You were cared for by a hospitalist during your hospital stay. If you have any questions about your discharge medications or the care you received while you were in the hospital after you are discharged, you can call the unit and ask to speak with the hospitalist on call if the hospitalist that took care of you is not available. Once you are discharged, your primary care physician will handle any further medical issues. Please note that NO REFILLS for any discharge medications will be authorized once you are discharged, as it is imperative that you return to your primary care physician (or establish a relationship with a primary care physician if you do not have one) for your aftercare needs so that they can reassess your need for medications and monitor your lab values.   Increase activity slowly   Complete by:  As directed       Allergies as of 08/19/2021       Reactions   Penicillins Other (See Comments)   Has patient had a PCN reaction causing immediate rash, facial/tongue/throat swelling, SOB or lightheadedness with hypotension: no Has patient had a PCN reaction causing severe rash involving mucus membranes or skin necrosis: no Has patient had a PCN reaction that required  hospitalization no REACTION UNKNOWN Has patient had a PCN reaction occurring within the last 10 years: yes If all of the above answers are "NO", then may proceed with Cephalosporin use.   Sulfa Antibiotics Other (See Comments)   REACTION UNKNOWN        Medication List     TAKE these medications    Advair Diskus 250-50 MCG/ACT Aepb Generic drug: fluticasone-salmeterol Inhale 1 puff into the lungs 2 (two) times daily.   albuterol 108 (90 Base) MCG/ACT inhaler Commonly known as: VENTOLIN HFA Inhale 2 puffs into the lungs every 6 (six) hours as needed for wheezing or shortness of breath.   Anoro Ellipta 62.5-25 MCG/INH Aepb Generic drug: umeclidinium-vilanterol Inhale 1 puff into the lungs daily.   azithromycin 250 MG tablet Commonly known as: ZITHROMAX Take 1 tablet (250 mg total) by mouth daily for 1 day.   gabapentin 400 MG capsule Commonly known as: Neurontin Take 1 capsule (400 mg total) by mouth 2 (two) times daily. What changed:  how much to take when to take this   Levothyroxine Sodium 137 MCG Caps Take 137 mcg by mouth daily before breakfast. What changed: Another medication with the same name was removed. Continue taking this medication, and follow the directions you see here.   mirtazapine 15 MG disintegrating tablet Commonly known as: REMERON SOL-TAB Take 15 mg by mouth at bedtime.   PARoxetine 40 MG tablet Commonly known as: Paxil Take 1 tablet (40 mg total) by mouth daily.   predniSONE 10 MG tablet Commonly known as: DELTASONE Take 4 tabs for 3 days, then 3 tabs for 3 days, then 2 tabs for 3 days, then 1 tab for 3 days, then 1/2 tab for 4 days.   simvastatin 40 MG tablet Commonly known as: ZOCOR Take 40 mg by mouth daily.   Stiolto Respimat 2.5-2.5 MCG/ACT Aers Generic drug: Tiotropium Bromide-Olodaterol SMARTSIG:2 Puff(s) Via Inhaler Daily               Durable Medical Equipment  (From admission, onward)           Start      Ordered   08/18/21 1154  For home use only DME oxygen  Once       Question Answer Comment  Length of Need 6 Months   Mode or (Route) Nasal cannula   Liters per Minute 2   Frequency Continuous (stationary and portable oxygen unit needed)   Oxygen delivery system Gas      08/18/21 1153   08/16/21 1605  For home use only DME oxygen  Once       Question Answer Comment  Length of Need 6 Months   Mode or (Route) Nasal cannula   Liters per Minute 2   Frequency Continuous (stationary and portable oxygen unit needed)   Oxygen conserving device Yes   Oxygen delivery system Gas      08/16/21 1604            Follow-up Information     Hillery Aldo, MD. Schedule an appointment as soon as possible for a visit in  1 week(s).   Specialty: Family Medicine Contact information: [address] [place] [address] [phone]                Allergies  Allergen Reactions   Penicillins Other (See Comments)    Has patient had a PCN reaction causing immediate rash, facial/tongue/throat swelling, SOB or lightheadedness with hypotension: no Has patient had a PCN reaction causing severe rash involving mucus membranes or skin necrosis: no Has patient had a PCN reaction that required hospitalization no  REACTION UNKNOWN Has patient had a PCN reaction occurring within the last 10 years: yes If all of the above answers are "NO", then may proceed with Cephalosporin use.     Sulfa Antibiotics Other (See Comments)    REACTION UNKNOWN    Consultations: None    Procedures/Studies: DG Chest 2 View  Result Date: 08/16/2021 CLINICAL DATA:  Shortness of breath. EXAM: CHEST - 2 VIEW COMPARISON:  06/25/2021 FINDINGS: Heart size is normal. No pleural effusion or edema. No airspace opacities identified. Chronic interstitial coarsening identified bilaterally. The visualized osseous structures are unremarkable. IMPRESSION: No active cardiopulmonary disease. Electronically Signed   By:  Signa Kell M.D.   On: 08/16/2021 12:35      Discharge Exam: Vitals:   08/19/21 0514 08/19/21 0736  BP: (!) 98/56 104/63  Pulse: 71 71  Resp: 16 19  Temp: 98.3 F (36.8 C) 97.7 F (36.5 C)  SpO2: 94% 97%    General: Pt is alert, awake, not in acute distress Cardiovascular: RRR, S1/S2 +, no edema Respiratory: CTA bilaterally, diminished, no wheezing, no rhonchi, no respiratory distress, no conversational dyspnea  Abdominal: Soft, NT, ND, bowel sounds + Extremities: no edema, no cyanosis Psych: Normal mood and affect, stable judgement and insight     The results of significant diagnostics from this hospitalization (including imaging, microbiology, ancillary and laboratory) are listed below for reference.     Microbiology: Recent Results (from the past 240 hour(s))  Resp Panel by RT-PCR (Flu A&B, Covid) Nasopharyngeal Swab     Status: None   Collection Time: 08/16/21  1:07 PM   Specimen: Nasopharyngeal Swab; Nasopharyngeal(NP) swabs in vial transport medium  Result Value Ref Range Status   SARS Coronavirus 2 by RT PCR NEGATIVE NEGATIVE Final    Comment: (NOTE) SARS-CoV-2 target nucleic acids are NOT DETECTED.  The SARS-CoV-2 RNA is generally detectable in upper respiratory specimens during the acute phase of infection. The lowest concentration of SARS-CoV-2 viral copies this assay can detect is 138 copies/mL. A negative result does not preclude SARS-Cov-2 infection and should not be used as the sole basis for treatment or other patient management decisions. A negative result may occur with  improper specimen collection/handling, submission of specimen other than nasopharyngeal swab, presence of viral mutation(s) within the areas targeted by this assay, and inadequate number of viral copies(<138 copies/mL). A negative result must be combined with clinical observations, patient history, and epidemiological information. The expected result is Negative.  Fact Sheet for  Patients:  BloggerCourse.com  Fact Sheet for Healthcare Providers:  SeriousBroker.it  This test is no t yet approved or cleared by the [place] FDA and  has been authorized for detection and/or diagnosis of SARS-CoV-2 by FDA under an Emergency Use Authorization (EUA). This EUA will remain  in effect (meaning this test can be used) for the duration of the COVID-19 declaration under Section 564(b)(1) of the Act, 21 U.S.C.section 360bbb-3(b)(1), unless the authorization is terminated  or revoked sooner.  Influenza A by PCR NEGATIVE NEGATIVE Final   Influenza B by PCR NEGATIVE NEGATIVE Final    Comment: (NOTE) The Xpert Xpress SARS-CoV-2/FLU/RSV plus assay is intended as an aid in the diagnosis of influenza from Nasopharyngeal swab specimens and should not be used as a sole basis for treatment. Nasal washings and aspirates are unacceptable for Xpert Xpress SARS-CoV-2/FLU/RSV testing.  Fact Sheet for Patients: BloggerCourse.com  Fact Sheet for Healthcare Providers: SeriousBroker.it  This test is not yet approved or cleared by the [place] FDA and has been authorized for detection and/or diagnosis of SARS-CoV-2 by FDA under an Emergency Use Authorization (EUA). This EUA will remain in effect (meaning this test can be used) for the duration of the COVID-19 declaration under Section 564(b)(1) of the Act, 21 U.S.C. section 360bbb-3(b)(1), unless the authorization is terminated or revoked.  Performed at [facility], [address] [place], [place] [address]      Labs: BNP (last 3 results) Recent Labs    06/26/21 0637  BNP 152.7*   Basic Metabolic Panel: Recent Labs  Lab 08/16/21 1138 08/17/21 0520  NA 143 141  K 3.9 4.5  CL 100 102  CO2 34* 30  GLUCOSE 117* 124*  BUN 8 13  CREATININE 0.70 0.74  CALCIUM 8.2* 8.9  MG  --  2.5*   Liver  Function Tests: No results for input(s): AST, ALT, ALKPHOS, BILITOT, PROT, ALBUMIN in the last 168 hours. No results for input(s): LIPASE, AMYLASE in the last 168 hours. No results for input(s): AMMONIA in the last 168 hours. CBC: Recent Labs  Lab 08/16/21 1138 08/17/21 0520  WBC 6.5 7.9  HGB 13.7 13.1  HCT 42.8 40.9  MCV 91.5 89.7  PLT 181 221   Cardiac Enzymes: No results for input(s): CKTOTAL, CKMB, CKMBINDEX, TROPONINI in the last 168 hours. BNP: Invalid input(s): POCBNP CBG: No results for input(s): GLUCAP in the last 168 hours. D-Dimer No results for input(s): DDIMER in the last 72 hours. Hgb A1c No results for input(s): HGBA1C in the last 72 hours. Lipid Profile No results for input(s): CHOL, HDL, LDLCALC, TRIG, CHOLHDL, LDLDIRECT in the last 72 hours. Thyroid function studies Recent Labs    08/17/21 0520  TSH 0.539   Anemia work up No results for input(s): VITAMINB12, FOLATE, FERRITIN, TIBC, IRON, RETICCTPCT in the last 72 hours. Urinalysis    Component Value Date/Time   COLORURINE YELLOW (A) 06/25/2021 1140   APPEARANCEUR CLEAR (A) 06/25/2021 1140   APPEARANCEUR Turbid 01/16/2013 1541   LABSPEC 1.016 06/25/2021 1140   LABSPEC 1.025 01/16/2013 1541   PHURINE 6.0 06/25/2021 1140   GLUCOSEU NEGATIVE 06/25/2021 1140   GLUCOSEU Negative 01/16/2013 1541   HGBUR MODERATE (A) 06/25/2021 1140   BILIRUBINUR NEGATIVE 06/25/2021 1140   BILIRUBINUR Negative 01/16/2013 1541   KETONESUR NEGATIVE 06/25/2021 1140   PROTEINUR 100 (A) 06/25/2021 1140   NITRITE NEGATIVE 06/25/2021 1140   LEUKOCYTESUR NEGATIVE 06/25/2021 1140   LEUKOCYTESUR 3+ 01/16/2013 1541   Sepsis Labs Invalid input(s): PROCALCITONIN,  WBC,  LACTICIDVEN Microbiology Recent Results (from the past 240 hour(s))  Resp Panel by RT-PCR (Flu A&B, Covid) Nasopharyngeal Swab     Status: None   Collection Time: 08/16/21  1:07 PM   Specimen: Nasopharyngeal Swab; Nasopharyngeal(NP) swabs in vial transport  medium  Result Value Ref Range Status   SARS Coronavirus 2 by RT PCR NEGATIVE NEGATIVE Final    Comment: (NOTE) SARS-CoV-2 target nucleic acids are NOT DETECTED.  The SARS-CoV-2 RNA is  generally detectable in upper respiratory specimens during the acute phase of infection. The lowest concentration of SARS-CoV-2 viral copies this assay can detect is 138 copies/mL. A negative result does not preclude SARS-Cov-2 infection and should not be used as the sole basis for treatment or other patient management decisions. A negative result may occur with  improper specimen collection/handling, submission of specimen other than nasopharyngeal swab, presence of viral mutation(s) within the areas targeted by this assay, and inadequate number of viral copies(<138 copies/mL). A negative result must be combined with clinical observations, patient history, and epidemiological information. The expected result is Negative.  Fact Sheet for Patients:  BloggerCourse.com  Fact Sheet for Healthcare Providers:  SeriousBroker.it  This test is no t yet approved or cleared by the [place] FDA and  has been authorized for detection and/or diagnosis of SARS-CoV-2 by FDA under an Emergency Use Authorization (EUA). This EUA will remain  in effect (meaning this test can be used) for the duration of the COVID-19 declaration under Section 564(b)(1) of the Act, 21 U.S.C.section 360bbb-3(b)(1), unless the authorization is terminated  or revoked sooner.       Influenza A by PCR NEGATIVE NEGATIVE Final   Influenza B by PCR NEGATIVE NEGATIVE Final    Comment: (NOTE) The Xpert Xpress SARS-CoV-2/FLU/RSV plus assay is intended as an aid in the diagnosis of influenza from Nasopharyngeal swab specimens and should not be used as a sole basis for treatment. Nasal washings and aspirates are unacceptable for Xpert Xpress SARS-CoV-2/FLU/RSV testing.  Fact Sheet for  Patients: BloggerCourse.com  Fact Sheet for Healthcare Providers: SeriousBroker.it  This test is not yet approved or cleared by the [place] FDA and has been authorized for detection and/or diagnosis of SARS-CoV-2 by FDA under an Emergency Use Authorization (EUA). This EUA will remain in effect (meaning this test can be used) for the duration of the COVID-19 declaration under Section 564(b)(1) of the Act, 21 U.S.C. section 360bbb-3(b)(1), unless the authorization is terminated or revoked.  Performed at [facility], [address] [place], [place] [address]      Patient was seen and examined on the day of discharge and was found to be in stable condition. Time coordinating discharge: 20 minutes including assessment and coordination of care, as well as examination of the patient.   SIGNED:  Noralee Stain, DO [facility] 08/19/2021, 9:08 AM

## 2021-08-19 NOTE — TOC Transition Note (Signed)
Transition of Care [facility]) - CM/SW Discharge Note   Patient Details  Name: Caroline Coleman MRN: [id number] Date of Birth: 03/15/1956  [facility] CM/SW Contact:  Chapman Fitch, RN Phone Number: 08/19/2021, 12:56 PM   Clinical Narrative:     Patient discharged today O2 was delivered to room by adapt  [facility] transport arranged Waiver emailed         Patient Goals and CMS Choice        Discharge Placement                       Discharge Plan and Services                                     Social Determinants of Health (SDOH) Interventions     Readmission Risk Interventions No flowsheet data found.

## 2021-12-20 ENCOUNTER — Ambulatory Visit: Payer: Self-pay | Admitting: Urology

## 2022-01-31 ENCOUNTER — Ambulatory Visit: Payer: Self-pay | Admitting: Urology

## 2022-02-01 IMAGING — CR DG CHEST 2V
1 series · 2 of 2 positions shown · non-contrast
Comparison: 06/25/2021

CLINICAL DATA: Shortness of breath.

EXAM:
CHEST - 2 VIEW

[Series 1: dg chest 2 view · 0.14mm/px · 2 of 2 slices shown]
[im 1/2]
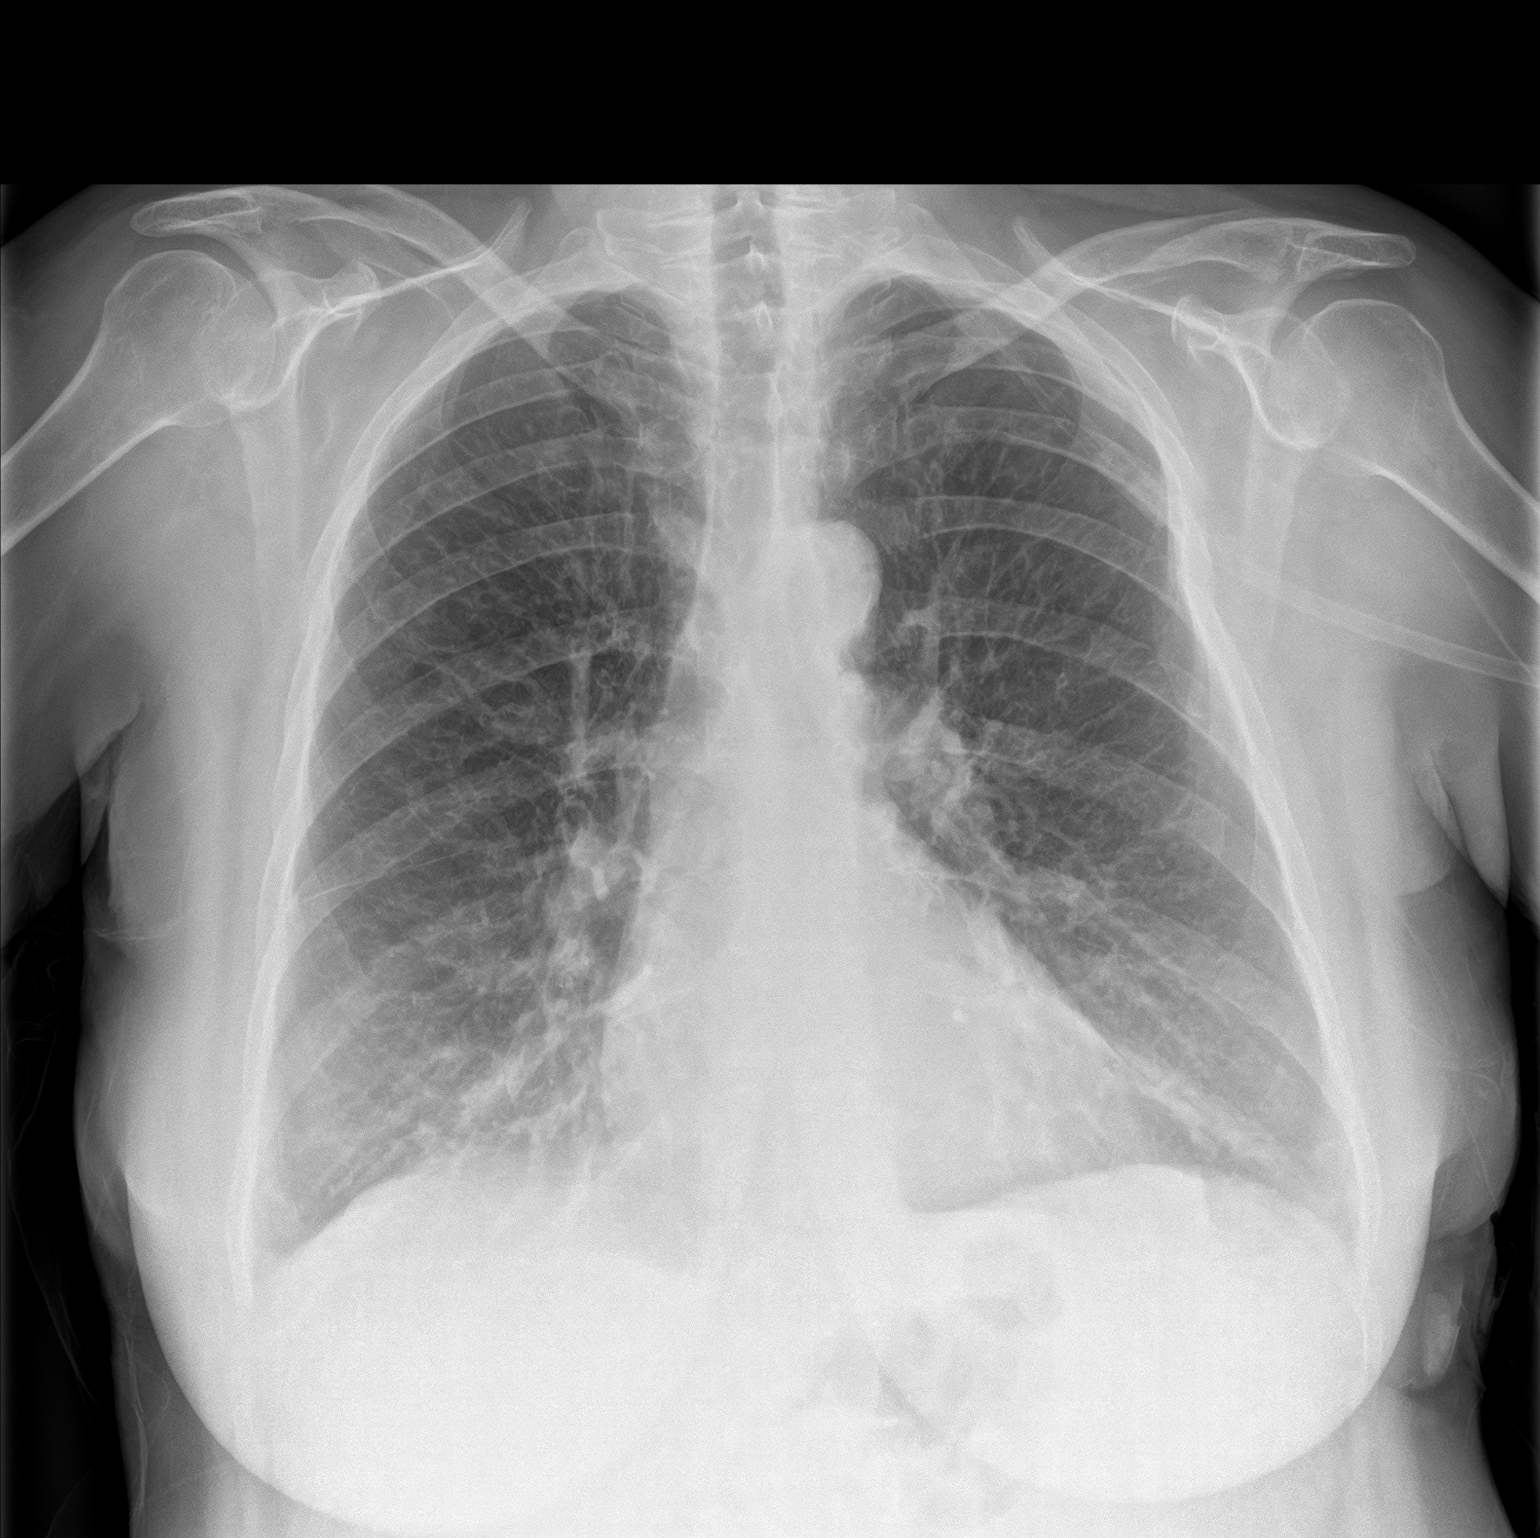
[im 2/2]
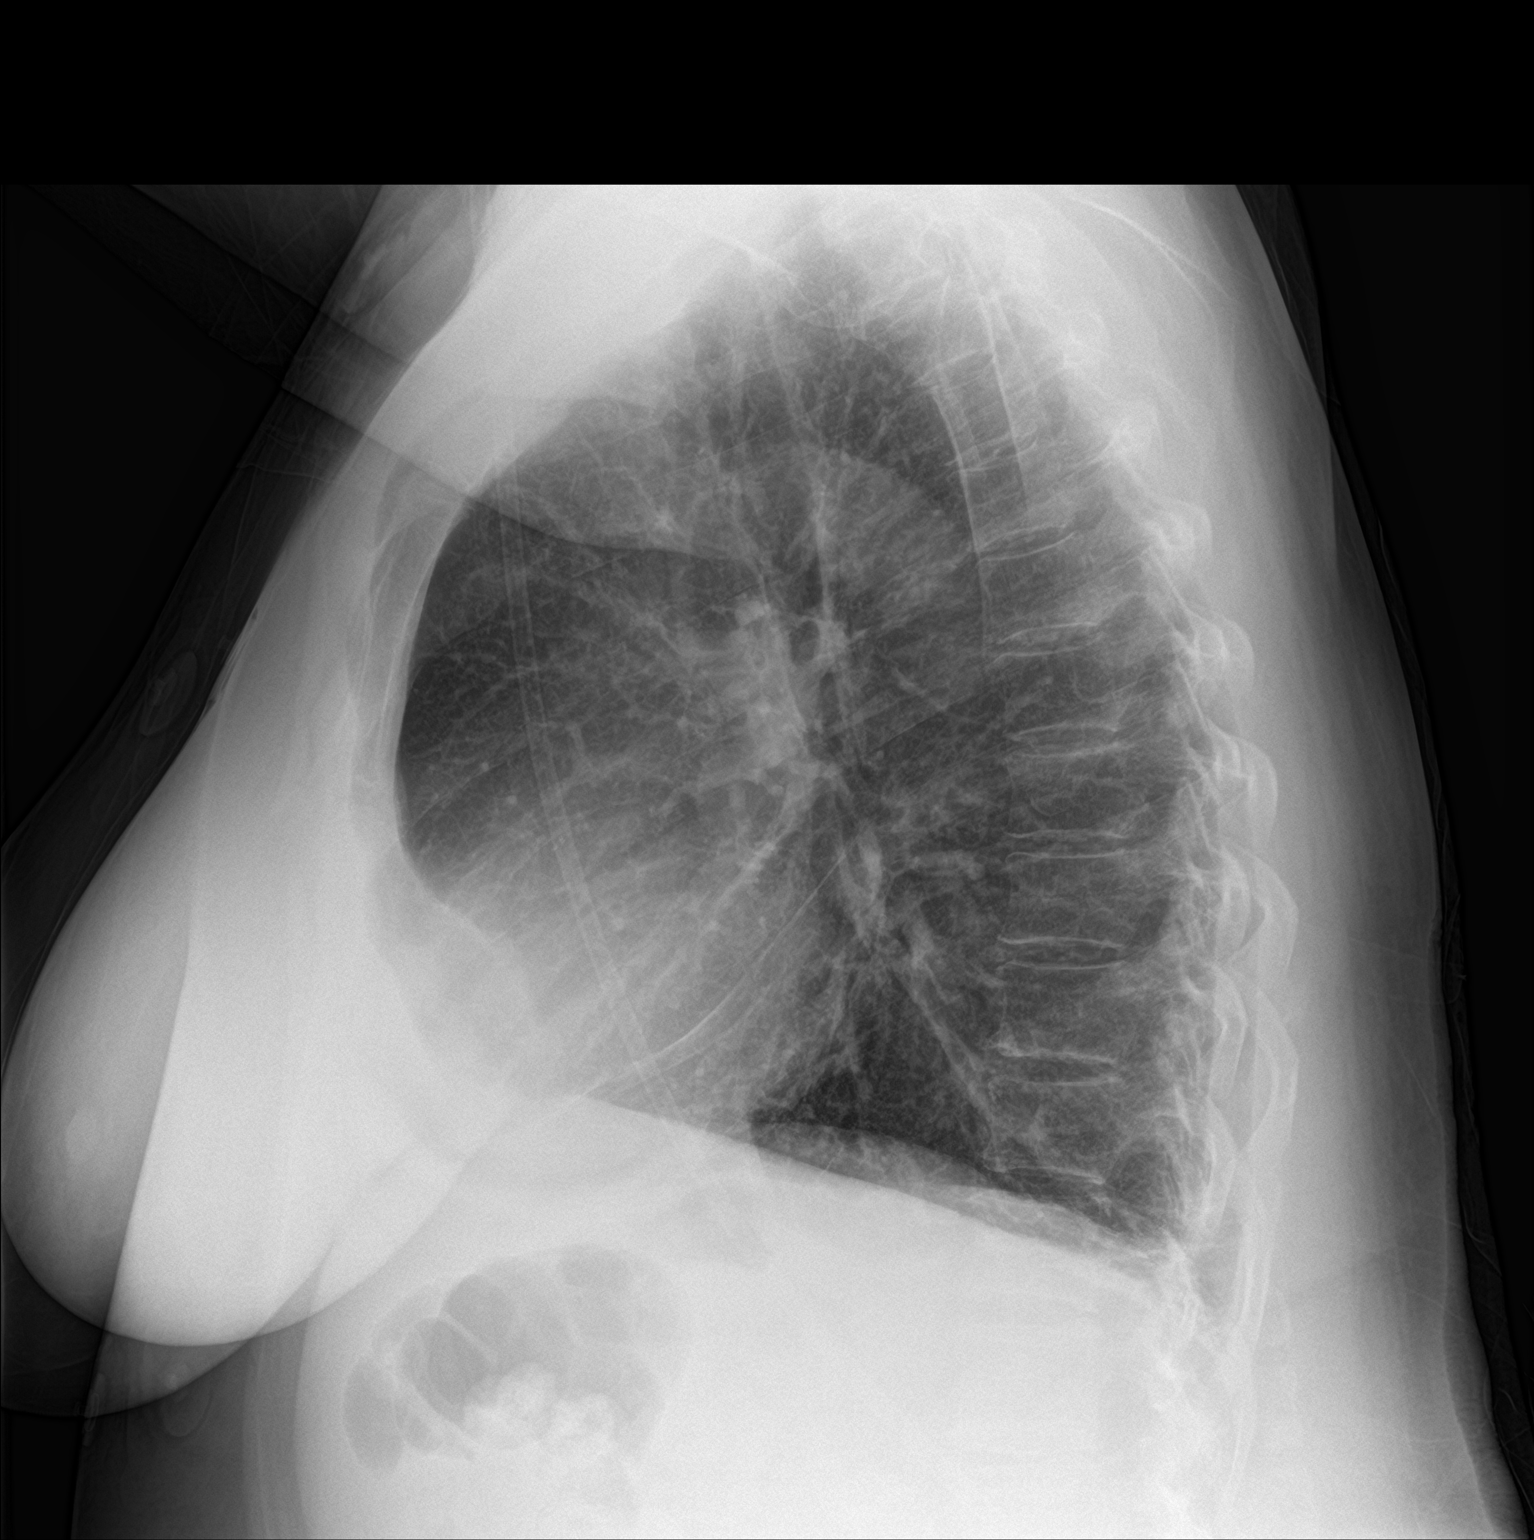

[2 of 2 positions shown; findings below may reference images not displayed]

FINDINGS: Heart size is normal. No pleural effusion or edema. No airspace
opacities identified. Chronic interstitial coarsening identified
bilaterally. The visualized osseous structures are unremarkable.
IMPRESSION: No active cardiopulmonary disease.

## 2022-03-03 ENCOUNTER — Other Ambulatory Visit: Payer: Self-pay

## 2022-03-03 ENCOUNTER — Encounter: Payer: Self-pay | Admitting: Internal Medicine

## 2022-03-03 ENCOUNTER — Observation Stay
Admission: EM | Admit: 2022-03-03 | Discharge: 2022-03-04 | Disposition: A | Discharge status: Home / Self Care | Payer: Medicare Other | Attending: Internal Medicine | Admitting: Internal Medicine

## 2022-03-03 ENCOUNTER — Emergency Department: Payer: Medicare Other

## 2022-03-03 DIAGNOSIS — E785 Hyperlipidemia, unspecified: Secondary | ICD-10-CM | POA: Diagnosis not present

## 2022-03-03 DIAGNOSIS — Z79899 Other long term (current) drug therapy: Secondary | ICD-10-CM | POA: Diagnosis not present

## 2022-03-03 DIAGNOSIS — J9621 Acute and chronic respiratory failure with hypoxia: Principal | ICD-10-CM | POA: Insufficient documentation

## 2022-03-03 DIAGNOSIS — F17210 Nicotine dependence, cigarettes, uncomplicated: Secondary | ICD-10-CM | POA: Diagnosis not present

## 2022-03-03 DIAGNOSIS — R0602 Shortness of breath: Secondary | ICD-10-CM | POA: Diagnosis present

## 2022-03-03 DIAGNOSIS — E039 Hypothyroidism, unspecified: Secondary | ICD-10-CM | POA: Diagnosis not present

## 2022-03-03 DIAGNOSIS — Z20822 Contact with and (suspected) exposure to covid-19: Secondary | ICD-10-CM | POA: Insufficient documentation

## 2022-03-03 DIAGNOSIS — J441 Chronic obstructive pulmonary disease with (acute) exacerbation: Principal | ICD-10-CM | POA: Insufficient documentation

## 2022-03-03 DIAGNOSIS — F1010 Alcohol abuse, uncomplicated: Secondary | ICD-10-CM

## 2022-03-03 DIAGNOSIS — Z720 Tobacco use: Secondary | ICD-10-CM | POA: Diagnosis present

## 2022-03-03 DIAGNOSIS — F32A Depression, unspecified: Secondary | ICD-10-CM | POA: Diagnosis present

## 2022-03-03 LAB — CBC
HCT: 47.6 % — ABNORMAL HIGH (ref 36.0–46.0)
Hemoglobin: 14.2 g/dL (ref 12.0–15.0)
MCH: 28 pg (ref 26.0–34.0)
MCHC: 29.8 g/dL — ABNORMAL LOW (ref 30.0–36.0)
MCV: 93.7 fL (ref 80.0–100.0)
Platelets: 194 10*3/uL (ref 150–400)
RBC: 5.08 MIL/uL (ref 3.87–5.11)
RDW: 14.9 % (ref 11.5–15.5)
WBC: 8.6 10*3/uL (ref 4.0–10.5)
nRBC: 0 % (ref 0.0–0.2)

## 2022-03-03 LAB — EXPECTORATED SPUTUM ASSESSMENT W GRAM STAIN, RFLX TO RESP C

## 2022-03-03 LAB — COMPREHENSIVE METABOLIC PANEL
ALT: 11 U/L (ref 0–44)
AST: 20 U/L (ref 15–41)
Albumin: 4.2 g/dL (ref 3.5–5.0)
Alkaline Phosphatase: 77 U/L (ref 38–126)
Anion gap: 9 (ref 5–15)
BUN: 7 mg/dL — ABNORMAL LOW (ref 8–23)
CO2: 34 mmol/L — ABNORMAL HIGH (ref 22–32)
Calcium: 9.3 mg/dL (ref 8.9–10.3)
Chloride: 99 mmol/L (ref 98–111)
Creatinine, Ser: 0.83 mg/dL (ref 0.44–1.00)
GFR, Estimated: >60 mL/min (ref >60)
Glucose, Bld: 113 mg/dL — ABNORMAL HIGH (ref 70–99)
Potassium: 4.7 mmol/L (ref 3.5–5.1)
Sodium: 142 mmol/L (ref 135–145)
Total Bilirubin: 0.4 mg/dL (ref 0.3–1.2)
Total Protein: 8.4 g/dL — ABNORMAL HIGH (ref 6.5–8.1)

## 2022-03-03 LAB — RESP PANEL BY RT-PCR (FLU A&B, COVID) ARPGX2
Influenza A by PCR: NEGATIVE
Influenza B by PCR: NEGATIVE
SARS Coronavirus 2 by RT PCR: NEGATIVE

## 2022-03-03 LAB — TROPONIN I (HIGH SENSITIVITY): Troponin I (High Sensitivity): 5 ng/L (ref <18)

## 2022-03-03 LAB — BRAIN NATRIURETIC PEPTIDE: B Natriuretic Peptide: 96.6 pg/mL (ref 0.0–100.0)

## 2022-03-03 MED ORDER — METHYLPREDNISOLONE SODIUM SUCC 125 MG IJ SOLR
125.0000 mg | Freq: Once | INTRAMUSCULAR | Status: AC
  Administered 2022-03-03: 125 mg via INTRAVENOUS
  Filled 2022-03-03: qty 2

## 2022-03-03 MED ORDER — ACETAMINOPHEN 325 MG PO TABS: 650.0000 mg | ORAL_TABLET | Freq: Four times a day (QID) | ORAL | Status: DC | PRN

## 2022-03-03 MED ORDER — PAROXETINE HCL 20 MG PO TABS
40.0000 mg | ORAL_TABLET | Freq: Every day | ORAL | Status: DC
  Administered 2022-03-04: 40 mg via ORAL
  Filled 2022-03-03: qty 2

## 2022-03-03 MED ORDER — SIMVASTATIN 20 MG PO TABS
40.0000 mg | ORAL_TABLET | Freq: Every day | ORAL | Status: DC
  Administered 2022-03-03: 40 mg via ORAL
  Filled 2022-03-03: qty 2

## 2022-03-03 MED ORDER — GABAPENTIN 100 MG PO CAPS
800.0000 mg | ORAL_CAPSULE | Freq: Four times a day (QID) | ORAL | Status: DC
  Administered 2022-03-03 – 2022-03-04 (×2): 800 mg via ORAL
  Filled 2022-03-03 (×4): qty 2

## 2022-03-03 MED ORDER — IPRATROPIUM-ALBUTEROL 0.5-2.5 (3) MG/3ML IN SOLN
3.0000 mL | Freq: Four times a day (QID) | RESPIRATORY_TRACT | Status: DC
Start: 2022-03-04 — End: 2022-03-04
  Administered 2022-03-04 (×3): 3 mL via RESPIRATORY_TRACT
  Filled 2022-03-03 (×3): qty 3

## 2022-03-03 MED ORDER — IPRATROPIUM-ALBUTEROL 0.5-2.5 (3) MG/3ML IN SOLN
3.0000 mL | RESPIRATORY_TRACT | Status: DC
  Administered 2022-03-03 (×2): 3 mL via RESPIRATORY_TRACT
  Filled 2022-03-03 (×2): qty 3

## 2022-03-03 MED ORDER — NICOTINE 21 MG/24HR TD PT24
21.0000 mg | MEDICATED_PATCH | Freq: Every day | TRANSDERMAL | Status: DC
Start: 2022-03-03 — End: 2022-03-04
  Filled 2022-03-03: qty 1

## 2022-03-03 MED ORDER — METHYLPREDNISOLONE SODIUM SUCC 125 MG IJ SOLR
80.0000 mg | INTRAMUSCULAR | Status: DC
  Administered 2022-03-04: 80 mg via INTRAVENOUS
  Filled 2022-03-03: qty 2

## 2022-03-03 MED ORDER — IPRATROPIUM-ALBUTEROL 0.5-2.5 (3) MG/3ML IN SOLN
3.0000 mL | Freq: Once | RESPIRATORY_TRACT | Status: AC
Start: 2022-03-03 — End: 2022-03-03
  Administered 2022-03-03: 3 mL via RESPIRATORY_TRACT
  Filled 2022-03-03: qty 3

## 2022-03-03 MED ORDER — AZITHROMYCIN 500 MG PO TABS
250.0000 mg | ORAL_TABLET | Freq: Every day | ORAL | Status: DC
  Administered 2022-03-04: 250 mg via ORAL
  Filled 2022-03-03: qty 1

## 2022-03-03 MED ORDER — LEVOTHYROXINE SODIUM 137 MCG PO TABS
137.0000 ug | ORAL_TABLET | Freq: Every day | ORAL | Status: DC
  Administered 2022-03-04: 137 ug via ORAL
  Filled 2022-03-03: qty 1

## 2022-03-03 MED ORDER — MIRTAZAPINE 15 MG PO TABS
15.0000 mg | ORAL_TABLET | Freq: Every day | ORAL | Status: DC
  Administered 2022-03-03: 15 mg via ORAL
  Filled 2022-03-03: qty 1

## 2022-03-03 MED ORDER — IPRATROPIUM-ALBUTEROL 0.5-2.5 (3) MG/3ML IN SOLN
3.0000 mL | Freq: Once | RESPIRATORY_TRACT | Status: AC
  Administered 2022-03-03: 3 mL via RESPIRATORY_TRACT
  Filled 2022-03-03: qty 3

## 2022-03-03 MED ORDER — AZITHROMYCIN 500 MG PO TABS
500.0000 mg | ORAL_TABLET | Freq: Every day | ORAL | Status: AC
Start: 2022-03-03 — End: 2022-03-03
  Administered 2022-03-03: 500 mg via ORAL
  Filled 2022-03-03: qty 1

## 2022-03-03 MED ORDER — ALBUTEROL SULFATE (2.5 MG/3ML) 0.083% IN NEBU
2.5000 mg | INHALATION_SOLUTION | RESPIRATORY_TRACT | Status: DC | PRN
Start: 2022-03-03 — End: 2022-03-04
  Filled 2022-03-03: qty 3

## 2022-03-03 MED ORDER — DM-GUAIFENESIN ER 30-600 MG PO TB12: 1.0000 | ORAL_TABLET | Freq: Two times a day (BID) | ORAL | Status: DC | PRN

## 2022-03-03 MED ORDER — ENOXAPARIN SODIUM 40 MG/0.4ML IJ SOSY
40.0000 mg | PREFILLED_SYRINGE | INTRAMUSCULAR | Status: DC
  Administered 2022-03-03: 40 mg via SUBCUTANEOUS
  Filled 2022-03-03: qty 0.4

## 2022-03-03 MED ORDER — ONDANSETRON HCL 4 MG/2ML IJ SOLN: 4.0000 mg | Freq: Three times a day (TID) | INTRAMUSCULAR | Status: DC | PRN

## 2022-03-03 NOTE — Progress Notes (Signed)
PHARMACIST - PHYSICIAN COMMUNICATION ?  ?CONCERNING: Methylprednisolone IV  ?  ?Current order: Methylprednisolone IV 40mg  BID ?  ?  ?DESCRIPTION: ?Per [facility] Protocol:  ? ?IV methylprednisolone will be converted to either a q12h or q24h frequency with the same total daily dose (TDD). ? ?Ordered Dose: 1 to 125 mg TDD; convert to: TDD q24h.  ?Ordered Dose: 126 to 250 mg TDD; convert to: TDD div q12h.  ?Ordered Dose: >250 mg TDD; DAW. ? ?Order has been adjusted to: Methylprednisolone IV 80mg  daily ? ? ?Caroline Coleman PharmD, BCPS ?03/03/2022 4:31 PM ? ?

## 2022-03-03 NOTE — ED Notes (Signed)
Informed RN bed assigned 

## 2022-03-03 NOTE — ED Provider Notes (Signed)
? ?[facility] ?Provider Note ? ? ? Event Date/Time  ? First MD Initiated Contact with Patient 03/03/22 1126   ?  (approximate) ? ?History  ? ?Chief Complaint: Shortness of Breath ? ?HPI ? ?Caroline Coleman is a 66 y.o. female with a past medical history of anxiety, COPD who wears 3 L at night who presents to the emergency department for shortness of breath.  According to the patient for the past 2 days she has been experiencing worsening shortness of breath as well as cough.  Patient denies any fever.  Patient states shortness of breath worsened this morning so she came to the emergency department for evaluation.  Patient denies any chest pain at any point.  Denies any leg pain or swelling. ? ?Physical Exam  ? ?Triage Vital Signs: ?ED Triage Vitals  ?Enc Vitals Group  ?   BP --   ?   Pulse Rate 03/03/22 1128 70  ?   Resp 03/03/22 1128 17  ?   Temp 03/03/22 1128 98.3 ?F (36.8 ?C)  ?   Temp Source 03/03/22 1128 Oral  ?   SpO2 03/03/22 1128 93 %  ?   Weight 03/03/22 1129 175 lb 0.7 oz (79.4 kg)  ?   Height 03/03/22 1129 5\' 4"  (1.626 m)  ?   Head Circumference --   ?   Peak Flow --   ?   Pain Score 03/03/22 1129 0  ?   Pain Loc --   ?   Pain Edu? --   ?   Excl. in GC? --   ? ? ?Most recent vital signs: ?Vitals:  ? 03/03/22 1128  ?Pulse: 70  ?Resp: 17  ?Temp: 98.3 ?F (36.8 ?C)  ?SpO2: 93%  ? ? ?General: Awake, no distress.  ?CV:  Good peripheral perfusion.  Regular rate and rhythm  ?Resp:  Mild tachypnea with mild rhonchi bilaterally. ?Abd:  No distention.  Soft, nontender.  No rebound or guarding. ?Other:  No lower extremity tenderness ? ? ?ED Results / Procedures / Treatments  ? ?EKG ? ?EKG viewed and interpreted by myself shows a normal sinus rhythm at 72 bpm with a narrow QRS, normal axis, normal intervals, no concerning ST changes. ? ?RADIOLOGY ? ?I personally viewed the chest x-ray images, slight haziness bilateral bases but no obvious lobar pneumonia. ?Radiology has read the x-ray as mild  interstitial edema superimposed on chronic lung findings. ? ? ?MEDICATIONS ORDERED IN ED: ?Medications  ?ipratropium-albuterol (DUONEB) 0.5-2.5 (3) MG/3ML nebulizer solution 3 mL (3 mLs Nebulization Given 03/03/22 1140)  ?ipratropium-albuterol (DUONEB) 0.5-2.5 (3) MG/3ML nebulizer solution 3 mL (3 mLs Nebulization Given 03/03/22 1140)  ?methylPREDNISolone sodium succinate (SOLU-MEDROL) 125 mg/2 mL injection 125 mg (125 mg Intravenous Given 03/03/22 1140)  ? ? ? ?IMPRESSION / MDM / ASSESSMENT AND PLAN / ED COURSE  ?I reviewed the triage vital signs and the nursing notes. ? ?Patient presents to the emergency department for 2 days of worsening shortness of breath.  Patient has a history of COPD wears 3 L of oxygen at night only per patient.  Overall patient appears well she does have some mild tachypnea with mild rhonchi bilaterally.  We will check labs, chest x-ray, dose 125 mg of Solu-Medrol, 2 DuoNebs and continue to closely monitor.  Patient agreeable.  Differential would include COPD exacerbation, ACS, pneumonia, pneumothorax. ? ?Patient's work-up is overall nonrevealing.  Chest x-ray shows possible slight interstitial edema however BNP is negative.  CBC shows no concerning  findings.  CMP is reassuring as well.  Troponin negative.  Patient did desat to around 88% placed on 3 L nasal cannula and is satting in the upper or mid 90s.  Patient continues to feel short of breath.  Patient has received Solu-Medrol and DuoNebs.  We will admit for COPD exacerbation.  COVID/flu swab pending. ? ?FINAL CLINICAL IMPRESSION(S) / ED DIAGNOSES  ? ?Dyspnea ?COPD exacerbation ? ?Note:  This document was prepared using Dragon voice recognition software and may include unintentional dictation errors. ?  ?Minna Antis, MD ?03/03/22 1355 ? ?

## 2022-03-03 NOTE — H&P (Signed)
?History and Physical  ? ? ?Caroline Coleman Bole ONG:[id number] DOB: 08/23/1956 DOA: 03/03/2022 ? ?Referring MD/NP/PA:  ? ?PCP: Caroline Coleman, [name], MD  ? ?Patient coming from:  The patient is coming from home.  At baseline, pt is independent for most of ADL.       ? ?Chief Complaint: SOB ? ?HPI: Caroline Coleman Caroline Coleman is Coleman 66 y.o. 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 ? ?Patient states that she has shortness of breath for more than 2 days, which has been progressively worsening.  Patient has cough with yellow-colored sputum production.  No chest pain, fever or chills.  No nausea, vomiting, diarrhea or abdominal pain General symptoms of UTI.  Patient normally use oxygen 3 L only at night, but was found to have oxygen desaturation to 88% on room air today.  3 L oxygen was started, with oxygen saturation 93 to 95%. ? ?Data Reviewed and ED Course: pt was found to have WBC 8.6, BNP 96.6, troponin level 5, negative COVID PCR, GFR> 60, temperature normal, blood pressure 115/71, heart rate 70, RR 17.  Chest x-ray showed chronic interstitial change.  Patient is admitted to MedSurg bed as inpatient ? ? ?EKG: I have personally reviewed.  Sinus rhythm, QTc 416, low voltage.  Nonspecific T wave change ? ? ?Review of Systems:  ? ?General: no fevers, chills, no body weight gain, has fatigue ?HEENT: no blurry vision, hearing changes or sore throat ?Respiratory: Has dyspnea, coughing, wheezing ?CV: no chest pain, no palpitations ?GI: no nausea, vomiting, abdominal pain, diarrhea, constipation ?GU: no dysuria, burning on urination, increased urinary frequency, hematuria  ?Ext: no leg edema ?Neuro: no unilateral weakness, numbness, or tingling, no vision change or hearing loss ?Skin: no rash, no skin tear. ?MSK: No muscle spasm, no deformity, no limitation of range of movement in  spin ?Heme: No easy bruising.  ?Travel history: No recent long distant travel. ? ? ?Allergy:  ?Allergies  ?Allergen Reactions  ? Penicillins Other (See Comments)  ?  Has patient had Coleman PCN reaction causing immediate rash, facial/tongue/throat swelling, SOB or lightheadedness with hypotension: no ?Has patient had Coleman PCN reaction causing severe rash involving mucus membranes or skin necrosis: no ?Has patient had Coleman PCN reaction that required hospitalization no ? ?REACTION UNKNOWN ?Has patient had Coleman PCN reaction occurring within the last 10 years: yes ?If all of the above answers are "NO", then may proceed with Cephalosporin use. ? ?  ? Sulfa Antibiotics Other (See Comments)  ?  REACTION UNKNOWN  ? ? ?Past Medical History:  ?Diagnosis Date  ? Anxiety   ? COPD (chronic obstructive pulmonary disease) (HCC)   ? Depression   ? Thyroid disease   ? ? ?Past Surgical History:  ?Procedure Laterality Date  ? EYE SURGERY    ? HIP SURGERY    ? ? ?Social History:  reports that she has been smoking cigarettes. She has Coleman 22.50 pack-year smoking history. She uses smokeless tobacco. She reports that she does not drink alcohol and does not use drugs. ? ?Family History:  ?Family History  ?Problem Relation Age of Onset  ? Breast cancer Mother   ? Heart attack Father   ? Heart attack Brother   ?  ? ?Prior to Admission medications   ?Medication Sig Start Date End Date Taking? Authorizing Provider  ?ADVAIR DISKUS 250-50 MCG/ACT  AEPB Inhale 1 puff into the lungs 2 (two) times daily. ?Patient not taking: Reported on 08/16/2021 04/06/21   [provider]  ?albuterol (VENTOLIN HFA) 108 (90 Base) MCG/ACT inhaler Inhale 2 puffs into the lungs every 6 (six) hours as needed for wheezing or shortness of breath. 07/03/19   Concha Se, MD  ?gabapentin (NEURONTIN) 400 MG capsule Take 1 capsule (400 mg total) by mouth 2 (two) times daily. ?Patient taking differently: Take 800 mg by mouth 4 (four) times daily. 06/06/17 06/25/21  Caroline Blazer, MD  ?Levothyroxine Sodium 137 MCG CAPS Take 137 mcg by mouth daily before breakfast.    [provider]  ?mirtazapine (REMERON SOL-TAB) 15 MG disintegrating tablet Take 15 mg by mouth at bedtime. 06/01/21   [provider]  ?PARoxetine (PAXIL) 40 MG tablet Take 1 tablet (40 mg total) by mouth daily. 07/27/17 06/25/21  Tommi Rumps, PA-C  ?predniSONE (DELTASONE) 10 MG tablet Take 4 tabs for 3 days, then 3 tabs for 3 days, then 2 tabs for 3 days, then 1 tab for 3 days, then 1/2 tab for 4 days. 08/19/21   Noralee Stain, DO  ?simvastatin (ZOCOR) 40 MG tablet Take 40 mg by mouth daily. 10/01/20   [provider]  ?Caroline Coleman 2.5-2.5 MCG/ACT AERS SMARTSIG:2 Puff(s) Via Inhaler Daily 04/25/21   [provider]  ?umeclidinium-vilanterol (ANORO ELLIPTA) 62.5-25 MCG/INH AEPB Inhale 1 puff into the lungs daily. 01/28/21   Almon Hercules, MD  ? ? ?Physical Exam: ?Vitals:  ? 03/03/22 1128 03/03/22 1129 03/03/22 1400 03/03/22 1430  ?BP:   108/67 115/71  ?Pulse: 70  66 72  ?Resp: 17  16 17   ?Temp: 98.3 ?F (36.8 ?C)     ?TempSrc: Oral     ?SpO2: 93%  96% 96%  ?Weight:  79.4 kg    ?Height:  5\' 4"  (1.626 m)    ? ?General: Not in acute distress ?HEENT: ?      Eyes: PERRL, EOMI, no scleral icterus. ?      ENT: No discharge from the ears and nose, no pharynx injection, no tonsillar enlargement.  ?      Neck: No JVD, no bruit, no mass felt. ?Heme: No neck lymph node enlargement. ?Cardiac: S1/S2, RRR, No murmurs, No gallops or rubs. ?Respiratory: Has wheezing bilaterally ?GI: Soft, nondistended, nontender, no rebound pain, no organomegaly, BS present. ?GU: No hematuria ?Ext: No pitting leg edema bilaterally. 1+DP/PT pulse bilaterally. ?Musculoskeletal: No joint deformities, No joint redness or warmth, no limitation of ROM in spin. ?Skin: No rashes.  ?Neuro: Alert, oriented X3, cranial nerves II-XII grossly intact, moves all extremities normally.  ?Psych: Patient is not psychotic, no suicidal  or hemocidal ideation. ? ?Labs on Admission: I have personally reviewed following labs and imaging studies ? ?CBC: ?Recent Labs  ?Lab 03/03/22 ?1132  ?WBC 8.6  ?HGB 14.2  ?HCT 47.6*  ?MCV 93.7  ?PLT 194  ? ?Basic Metabolic Panel: ?Recent Labs  ?Lab 03/03/22 ?1132  ?NA 142  ?K 4.7  ?CL 99  ?CO2 34*  ?GLUCOSE 113*  ?BUN 7*  ?CREATININE 0.83  ?CALCIUM 9.3  ? ?GFR: ?Estimated Creatinine Clearance: 68.9 mL/min (by C-G formula based on SCr of 0.83 mg/dL). ?Liver Function Tests: ?Recent Labs  ?Lab 03/03/22 ?1132  ?AST 20  ?ALT 11  ?ALKPHOS 77  ?BILITOT 0.4  ?PROT 8.4*  ?ALBUMIN 4.2  ? ?No results for input(s): LIPASE, AMYLASE in the last 168 hours. ?No results for input(s):  AMMONIA in the last 168 hours. ?Coagulation Profile: ?No results for input(s): INR, PROTIME in the last 168 hours. ?Cardiac Enzymes: ?No results for input(s): CKTOTAL, CKMB, CKMBINDEX, TROPONINI in the last 168 hours. ?BNP (last 3 results) ?No results for input(s): PROBNP in the last 8760 hours. ?HbA1C: ?No results for input(s): HGBA1C in the last 72 hours. ?CBG: ?No results for input(s): GLUCAP in the last 168 hours. ?Lipid Profile: ?No results for input(s): CHOL, HDL, LDLCALC, TRIG, CHOLHDL, LDLDIRECT in the last 72 hours. ?Thyroid Function Tests: ?No results for input(s): TSH, T4TOTAL, FREET4, T3FREE, THYROIDAB in the last 72 hours. ?Anemia Panel: ?No results for input(s): VITAMINB12, FOLATE, FERRITIN, TIBC, IRON, RETICCTPCT in the last 72 hours. ?Urine analysis: ?   ?Component Value Date/Time  ? COLORURINE YELLOW (Coleman) 06/25/2021 1140  ? APPEARANCEUR CLEAR (Coleman) 06/25/2021 1140  ? APPEARANCEUR Turbid 01/16/2013 1541  ? LABSPEC 1.016 06/25/2021 1140  ? LABSPEC 1.025 01/16/2013 1541  ? PHURINE 6.0 06/25/2021 1140  ? GLUCOSEU NEGATIVE 06/25/2021 1140  ? GLUCOSEU Negative 01/16/2013 1541  ? HGBUR MODERATE (Coleman) 06/25/2021 1140  ? BILIRUBINUR NEGATIVE 06/25/2021 1140  ? BILIRUBINUR Negative 01/16/2013 1541  ? KETONESUR NEGATIVE 06/25/2021 1140  ? PROTEINUR 100  (Coleman) 06/25/2021 1140  ? NITRITE NEGATIVE 06/25/2021 1140  ? LEUKOCYTESUR NEGATIVE 06/25/2021 1140  ? LEUKOCYTESUR 3+ 01/16/2013 1541  ? ?Sepsis Labs: ?@[email or web address] (procalcitonin:4,lacticidven:4) ?) ?Recent Res

## 2022-03-03 NOTE — ED Notes (Signed)
Pt given coffee and warm blanket 

## 2022-03-03 NOTE — ED Triage Notes (Signed)
Pt presents to ED with c/o of SOB. Pt states this started 2 days ago. HX of COPD.  ?

## 2022-03-03 NOTE — ED Notes (Signed)
Pt presents to ED with c/o of SOB that has been ongoing for 2 days. Pt states HX of COPD, pt still smokes half a pack a day. Pt has c/o of only night sweats, pt denies any actual fevers to this RN. Pt denies any chest pain at this time. Pt able to speak in full sentences with no issues.  ?  ?Pt does have significant wheezing throughout all lobes. ?  ?EMS gave 2 duonebs PTA, pt states some relief but is still wheezing. Pt states she wears 3L/min via [place] at home at nighttime only. Pt denies wearing oxygen during the day. Pt was 94% on RA on arrival. Pt asked if she thought she need supplemental oxygen and pt denies it at this time and states "I feel somewhat better". NAD noted at this time. ?

## 2022-03-04 DIAGNOSIS — J9621 Acute and chronic respiratory failure with hypoxia: Secondary | ICD-10-CM | POA: Diagnosis not present

## 2022-03-04 LAB — HIV ANTIBODY (ROUTINE TESTING W REFLEX): HIV Screen 4th Generation wRfx: NONREACTIVE

## 2022-03-04 MED ORDER — PREDNISONE 50 MG PO TABS: ORAL_TABLET | ORAL | 0 refills | Status: DC

## 2022-03-04 MED ORDER — DM-GUAIFENESIN ER 30-600 MG PO TB12: 1.0000 | ORAL_TABLET | Freq: Two times a day (BID) | ORAL | 0 refills | Status: DC | PRN

## 2022-03-04 MED ORDER — AZITHROMYCIN 250 MG PO TABS: 250.0000 mg | ORAL_TABLET | Freq: Every day | ORAL | 0 refills | Status: AC

## 2022-03-04 MED ORDER — IPRATROPIUM-ALBUTEROL 0.5-2.5 (3) MG/3ML IN SOLN: 3.0000 mL | Freq: Three times a day (TID) | RESPIRATORY_TRACT | 1 refills | Status: AC | PRN

## 2022-03-04 NOTE — Discharge Summary (Signed)
?Physician Discharge Summary ?  ?Patient: Caroline FormosaShirley A Saylor MRN: [id number] DOB: 01/25/1956  ?Admit date:     03/03/2022  ?Discharge date: 03/04/22  ?Discharge Physician: Arnetha CourserSumayya Jorje Vanatta  ? ?PCP: [name], [name], MD  ? ?Recommendations at discharge:  ?Follow-up with primary care provider in 1 week. ?Make sure she completed a course of prednisone and Zithromax. ?Make sure she uses her oxygen all the time. ? ?Discharge Diagnoses: ?Principal Problem: ?  Acute on chronic respiratory failure with hypoxia (HCC) ?Active Problems: ?  COPD exacerbation (HCC) ?  Tobacco abuse ?  Depression ?  Acquired hypothyroidism ?  HLD (hyperlipidemia) ? ?Hospital Course: ?Taken from H&P. ? ?Caroline FormosaShirley A Fancher is a 66 y.o. 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 ?  ?Patient states that she has shortness of breath for more than 2 days, which has been progressively worsening.  Patient has cough with yellow-colored sputum production.  No chest pain, fever or chills.  No nausea, vomiting, diarrhea or abdominal pain General symptoms of UTI.  Patient normally use oxygen 3 L only at night, but was found to have oxygen desaturation to 88% on room air today.  3 L oxygen was started, with oxygen saturation 93 to 95%. ? ?Per patient she do become quite dyspneic with minor exertion at home, most likely need oxygen all the time. ? ?She was started on Solu-Medrol and Zithromax along with bronchodilators for COPD exacerbation. ? ?Next morning patient was feeling much improved, although still have some scattered wheeze. ?She wants to go home as this was a holiday weekend. ?She was also requesting home health services as she lives alone and recently becoming more winded and unable to perform her daily living chores. ?Home health services were ordered. ?She was also ordered a portable oxygen so she  can use it all the time.  Most likely she required 24/7 home oxygen now. ?She was given 5 days of prednisone and 4 more days of Zithromax. ?She has a nebulizer machine at home-she was given a prescription of DuoNeb and instructed to use 3 times a day for next couple of days and then only as needed. ?She will continue her home inhalers. ? ?She will continue current management and will follow-up with her primary care provider. ? ? ?Consultants: None ?Procedures performed: None ?Disposition: Home health ?Diet recommendation:  ?Discharge Diet Orders (From admission, onward)  ? ?  Start     Ordered  ? 03/04/22 0000  Diet - low sodium heart healthy       ? 03/04/22 1335  ? ?  ?  ? ?  ? ?Regular diet ?DISCHARGE MEDICATION: ?Allergies as of 03/04/2022   ? ?   Reactions  ? Penicillins Other (See Comments)  ? Has patient had a PCN reaction causing immediate rash, facial/tongue/throat swelling, SOB or lightheadedness with hypotension: no ?Has patient had a PCN reaction causing severe rash involving mucus membranes or skin necrosis: no ?Has patient had a PCN reaction that required hospitalization no ?REACTION UNKNOWN ?Has patient had a PCN reaction occurring within the last 10 years: yes ?If all of the above answers are "NO", then may proceed with Cephalosporin use.  ? Sulfa Antibiotics Other (See Comments)  ? REACTION UNKNOWN  ? ?  ? ?  ?Medication List  ?  ? ?TAKE these medications   ? ?albuterol (2.5 MG/3ML) 0.083%  nebulizer solution ?Commonly known as: PROVENTIL ?Take 2.5 mg by nebulization every 6 (six) hours as needed for wheezing or shortness of breath. ?  ?albuterol 108 (90 Base) MCG/ACT inhaler ?Commonly known as: VENTOLIN HFA ?Inhale 2 puffs into the lungs every 6 (six) hours as needed for wheezing or shortness of breath. ?  ?azithromycin 250 MG tablet ?Commonly known as: ZITHROMAX ?Take 1 tablet (250 mg total) by mouth daily for 4 days. ?Start taking on: March 05, 2022 ?  ?dextromethorphan-guaiFENesin 30-600 MG 12hr  tablet ?Commonly known as: MUCINEX DM ?Take 1 tablet by mouth 2 (two) times daily as needed for cough. ?  ?gabapentin 400 MG capsule ?Commonly known as: Neurontin ?Take 1 capsule (400 mg total) by mouth 2 (two) times daily. ?What changed:  ?how much to take ?when to take this ?additional instructions ?  ?ipratropium-albuterol 0.5-2.5 (3) MG/3ML Soln ?Commonly known as: DUONEB ?Take 3 mLs by nebulization every 8 (eight) hours as needed. ?  ?Levothyroxine Sodium 137 MCG Caps ?Take 137 mcg by mouth daily before breakfast. ?  ?mirtazapine 15 MG tablet ?Commonly known as: REMERON ?Take 15 mg by mouth at bedtime. ?  ?naproxen sodium 220 MG tablet ?Commonly known as: ALEVE ?Take 220 mg by mouth. ?  ?PARoxetine 40 MG tablet ?Commonly known as: Paxil ?Take 1 tablet (40 mg total) by mouth daily. ?  ?predniSONE 50 MG tablet ?Commonly known as: DELTASONE ?1 tablet daily for 5 days ?  ?simvastatin 40 MG tablet ?Commonly known as: ZOCOR ?Take 40 mg by mouth daily. ?  ?Spiriva HandiHaler 18 MCG inhalation capsule ?Generic drug: tiotropium ?Place 1 capsule into inhaler and inhale daily. ?  ? ?  ? ?  ?  ? ? ?  ?Durable Medical Equipment  ?(From admission, onward)  ?  ? ? ?  ? ?  Start     Ordered  ? 03/04/22 1346  For home use only DME oxygen  Once       ?Question Answer Comment  ?Length of Need Lifetime   ?Mode or (Route) Nasal cannula   ?Liters per Minute 2   ?Frequency Continuous (stationary and portable oxygen unit needed)   ?Oxygen conserving device Yes   ?Oxygen delivery system Gas   ?  ? 03/04/22 1345  ? ?  ?  ? ?  ? ? Follow-up Information   ? ? Hillery Aldo, MD. Schedule an appointment as soon as possible for a visit in 1 week(s).   ?Specialty: Family Medicine ?Contact information: ?[address] ?[place] [address] ?[phone] ? ? ?  ?  ? ?  ?  ? ?  ? ?Discharge Exam: ?Ceasar Mons Weights  ? 03/03/22 1129  ?Weight: 79.4 kg  ? ?General.     In no acute distress. ?Pulmonary.  Scattered inspiratory wheeze  bilaterally, normal respiratory effort. ?CV.  Regular rate and rhythm, no JVD, rub or murmur. ?Abdomen.  Soft, nontender, nondistended, BS positive. ?CNS.  Alert and oriented .  No focal neurologic deficit. ?Extremities.  No edema, no cyanosis, pulses intact and symmetrical. ?Psychiatry.  Judgment and insight appears normal.  ? ?Condition at discharge: stable ? ?The results of significant diagnostics from this hospitalization (including imaging, microbiology, ancillary and laboratory) are listed below for reference.  ? ?Imaging Studies: ?DG Chest Portable 1 View ? ?Result Date: 03/03/2022 ?CLINICAL DATA:  Shortness of breath and history of COPD. EXAM: PORTABLE CHEST 1 VIEW COMPARISON:  08/16/2021 FINDINGS: The heart size and mediastinal contours are within normal limits.  Superimposed on chronic interstitial lung disease is potentially a component interstitial pulmonary edema. No focal airspace consolidation, pleural fluid or pneumothorax identified. The visualized skeletal structures are unremarkable. IMPRESSION: Potential pulmonary interstitial edema superimposed on chronic interstitial lung disease. Electronically Signed   By: Irish Lack M.D.   On: 03/03/2022 12:18   ? ?Microbiology: ?Results for orders placed or performed during the hospital encounter of 03/03/22  ?Resp Panel by RT-PCR (Flu A&B, Covid) Nasopharyngeal Swab     Status: None  ? Collection Time: 03/03/22  1:51 PM  ? Specimen: Nasopharyngeal Swab; Nasopharyngeal(NP) swabs in vial transport medium  ?Result Value Ref Range Status  ? SARS Coronavirus 2 by RT PCR NEGATIVE NEGATIVE Final  ?  Comment: (NOTE) ?SARS-CoV-2 target nucleic acids are NOT DETECTED. ? ?The SARS-CoV-2 RNA is generally detectable in upper respiratory ?specimens during the acute phase of infection. The lowest ?concentration of SARS-CoV-2 viral copies this assay can detect is ?138 copies/mL. A negative result does not preclude SARS-Cov-2 ?infection and should not be used as the sole  basis for treatment or ?other patient management decisions. A negative result may occur with  ?improper specimen collection/handling, submission of specimen other ?than nasopharyngeal swab, presence of viral mu

## 2022-03-04 NOTE — Hospital Course (Signed)
Taken from H&P. ? ?Caroline Coleman is a 66 y.o. 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 ?  ?Patient states that she has shortness of breath for more than 2 days, which has been progressively worsening.  Patient has cough with yellow-colored sputum production.  No chest pain, fever or chills.  No nausea, vomiting, diarrhea or abdominal pain General symptoms of UTI.  Patient normally use oxygen 3 L only at night, but was found to have oxygen desaturation to 88% on room air today.  3 L oxygen was started, with oxygen saturation 93 to 95%. ? ?Per patient she do become quite dyspneic with minor exertion at home, most likely need oxygen all the time. ? ?She was started on Solu-Medrol and Zithromax along with bronchodilators for COPD exacerbation. ? ?Next morning patient was feeling much improved, although still have some scattered wheeze. ?She wants to go home as this was a holiday weekend. ?She was also requesting home health services as she lives alone and recently becoming more winded and unable to perform her daily living chores. ?Home health services were ordered. ?She was also ordered a portable oxygen so she can use it all the time.  Most likely she required 24/7 home oxygen now. ?She was given 5 days of prednisone and 4 more days of Zithromax. ?She has a nebulizer machine at home-she was given a prescription of DuoNeb and instructed to use 3 times a day for next couple of days and then only as needed. ?She will continue her home inhalers. ? ?She will continue current management and will follow-up with her primary care provider. ? ? ?

## 2022-03-04 NOTE — Progress Notes (Signed)
Pt d/c home with friend. IV removed intact. VSS. Education completed. All belongings sent with pt. All questions answered.  Pt was offered afternoon dose of gabapentin and a nebulizer treatment before she left, but refused both.  ?

## 2022-03-04 NOTE — Progress Notes (Addendum)
Mobility Specialist - Progress Note ? ? 03/04/22 1000  ?Mobility  ?Activity Ambulated independently in room;Dangled on edge of bed  ?Level of Assistance Independent  ?Assistive Device None  ?Distance Ambulated (ft) 110 ft  ?Activity Response Tolerated well  ?$Mobility charge 1 Mobility  ? ? ?Pre-mobility: 68 HR, 95% SpO2 ?During mobility: 82 HR, 88-93% SpO2 ?Post-mobility: 68 HR, 98% SpO2 ? ? ?Pt lying in bed upon arrival, utilizing 2L. Pt ambulated in room, per pt request, independently. O2 weaned throughout session with sats maintaining >/= 88% on RA. PLB educated. No complaints. Pt left EOB with needs in reach and breakfast tray set up. Returned to [employer] with RN notified of pt performance.  ? ? ?Filiberto Pinks ?Mobility Specialist ?03/04/22, 10:40 AM ? ? ? ? ?

## 2022-03-06 LAB — CULTURE, RESPIRATORY W GRAM STAIN: Gram Stain: NONE SEEN

## 2022-03-14 ENCOUNTER — Ambulatory Visit: Payer: Medicaid Other | Admitting: Urology

## 2022-03-14 ENCOUNTER — Encounter: Payer: Self-pay | Admitting: Urology

## 2022-08-19 IMAGING — DX DG CHEST 1V PORT
1 series · 1 of 1 positions shown · non-contrast
Comparison: 08/16/2021

CLINICAL DATA: Shortness of breath and history of COPD.

EXAM:
PORTABLE CHEST 1 VIEW

[chest ap]
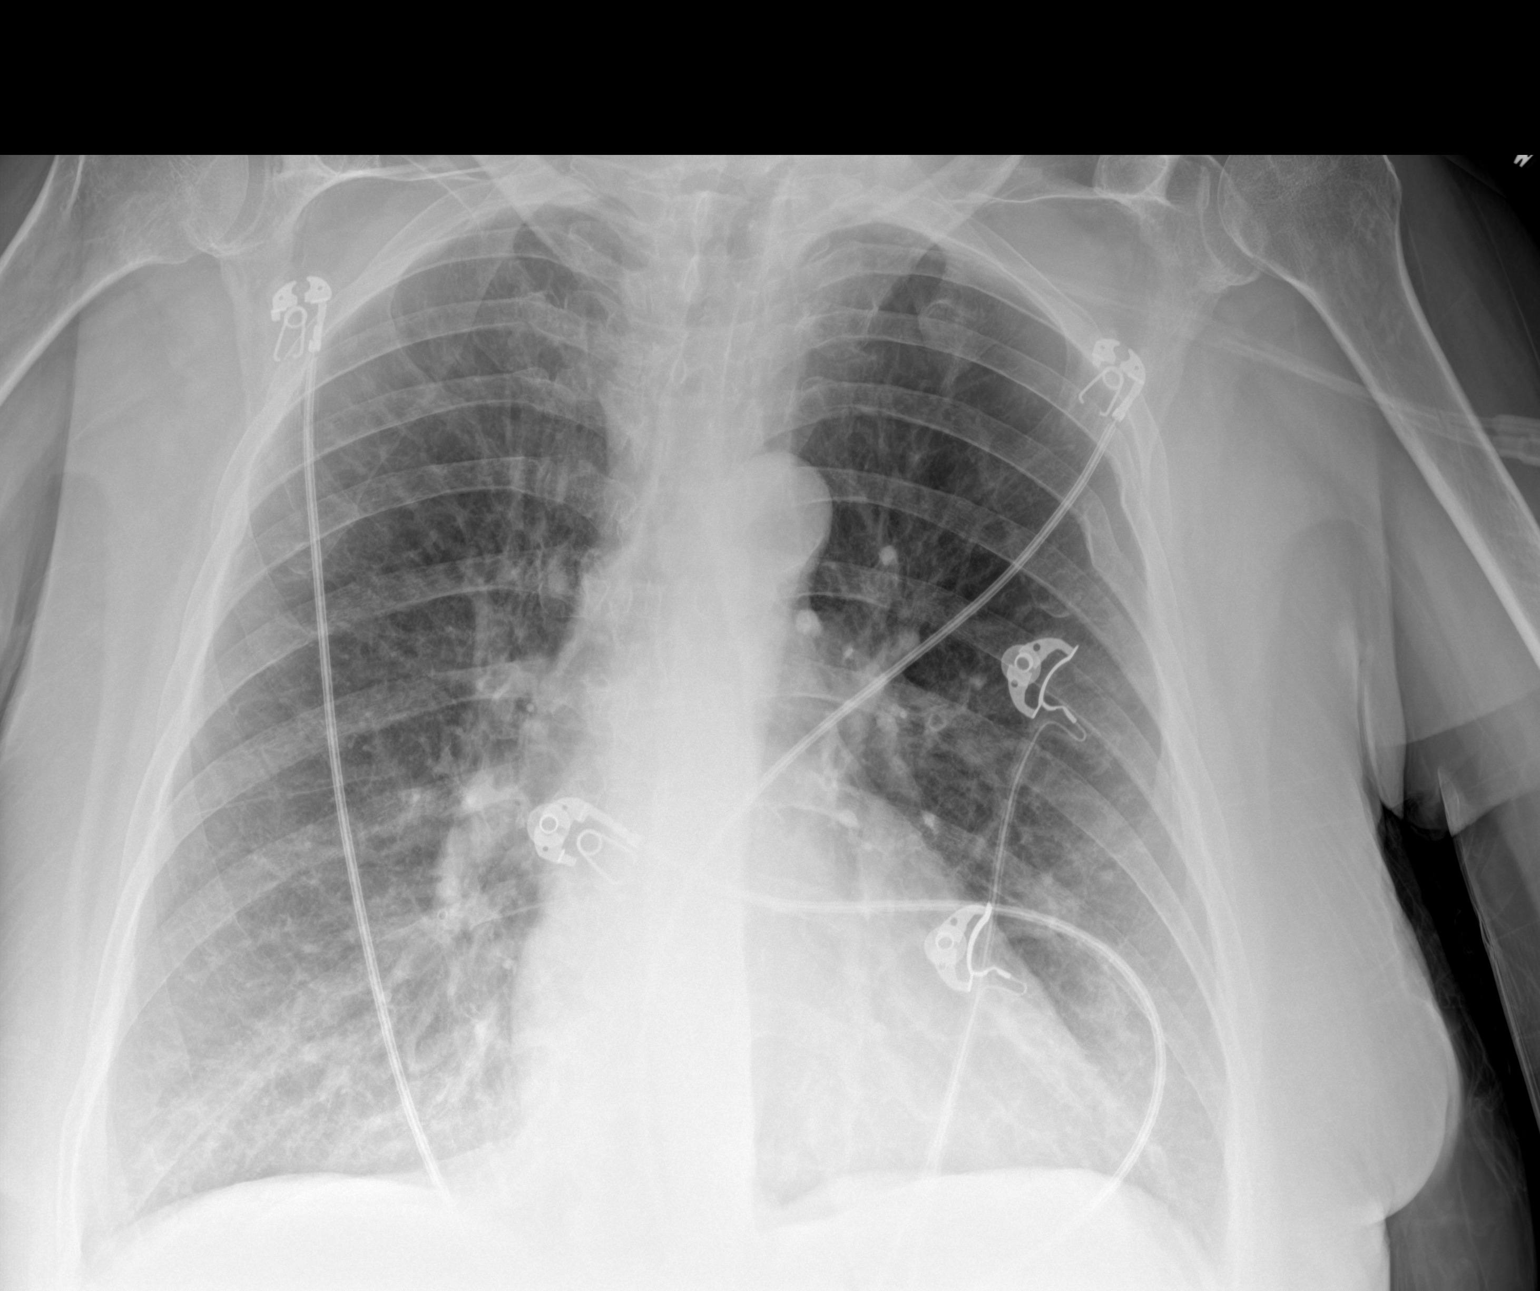

[1 of 1 positions shown; findings below may reference images not displayed]

FINDINGS: The heart size and mediastinal contours are within normal limits.
Superimposed on chronic interstitial lung disease is potentially a
component interstitial pulmonary edema. No focal airspace
consolidation, pleural fluid or pneumothorax identified. The
visualized skeletal structures are unremarkable.
IMPRESSION: Potential pulmonary interstitial edema superimposed on chronic
interstitial lung disease.

## 2022-09-27 ENCOUNTER — Other Ambulatory Visit: Payer: Self-pay | Admitting: Family Medicine

## 2022-09-27 DIAGNOSIS — Z1231 Encounter for screening mammogram for malignant neoplasm of breast: Secondary | ICD-10-CM

## 2022-12-29 ENCOUNTER — Encounter: Payer: Self-pay | Admitting: *Deleted

## 2023-03-14 ENCOUNTER — Inpatient Hospital Stay
Admission: EM | Admit: 2023-03-14 | Discharge: 2023-03-16 | DRG: 871 | Disposition: A | Discharge status: Home / Self Care | Payer: Medicare (Managed Care) | Attending: Internal Medicine | Admitting: Internal Medicine

## 2023-03-14 ENCOUNTER — Emergency Department: Payer: Medicare (Managed Care)

## 2023-03-14 DIAGNOSIS — Z803 Family history of malignant neoplasm of breast: Secondary | ICD-10-CM

## 2023-03-14 DIAGNOSIS — G9341 Metabolic encephalopathy: Secondary | ICD-10-CM | POA: Diagnosis present

## 2023-03-14 DIAGNOSIS — J189 Pneumonia, unspecified organism: Secondary | ICD-10-CM | POA: Diagnosis present

## 2023-03-14 DIAGNOSIS — F1911 Other psychoactive substance abuse, in remission: Secondary | ICD-10-CM | POA: Diagnosis not present

## 2023-03-14 DIAGNOSIS — F1994 Other psychoactive substance use, unspecified with psychoactive substance-induced mood disorder: Secondary | ICD-10-CM | POA: Diagnosis present

## 2023-03-14 DIAGNOSIS — A419 Sepsis, unspecified organism: Secondary | ICD-10-CM | POA: Diagnosis not present

## 2023-03-14 DIAGNOSIS — J440 Chronic obstructive pulmonary disease with acute lower respiratory infection: Secondary | ICD-10-CM | POA: Diagnosis present

## 2023-03-14 DIAGNOSIS — Z720 Tobacco use: Secondary | ICD-10-CM

## 2023-03-14 DIAGNOSIS — F419 Anxiety disorder, unspecified: Secondary | ICD-10-CM | POA: Diagnosis present

## 2023-03-14 DIAGNOSIS — F17210 Nicotine dependence, cigarettes, uncomplicated: Secondary | ICD-10-CM | POA: Diagnosis present

## 2023-03-14 DIAGNOSIS — Z9981 Dependence on supplemental oxygen: Secondary | ICD-10-CM | POA: Diagnosis not present

## 2023-03-14 DIAGNOSIS — J9621 Acute and chronic respiratory failure with hypoxia: Secondary | ICD-10-CM

## 2023-03-14 DIAGNOSIS — Z79899 Other long term (current) drug therapy: Secondary | ICD-10-CM

## 2023-03-14 DIAGNOSIS — Z8249 Family history of ischemic heart disease and other diseases of the circulatory system: Secondary | ICD-10-CM

## 2023-03-14 DIAGNOSIS — R6520 Severe sepsis without septic shock: Secondary | ICD-10-CM | POA: Diagnosis present

## 2023-03-14 DIAGNOSIS — Z882 Allergy status to sulfonamides status: Secondary | ICD-10-CM

## 2023-03-14 DIAGNOSIS — Z880 Allergy status to penicillin: Secondary | ICD-10-CM

## 2023-03-14 DIAGNOSIS — Z1152 Encounter for screening for COVID-19: Secondary | ICD-10-CM | POA: Diagnosis not present

## 2023-03-14 DIAGNOSIS — F1010 Alcohol abuse, uncomplicated: Secondary | ICD-10-CM | POA: Diagnosis present

## 2023-03-14 DIAGNOSIS — E039 Hypothyroidism, unspecified: Secondary | ICD-10-CM | POA: Diagnosis present

## 2023-03-14 DIAGNOSIS — J441 Chronic obstructive pulmonary disease with (acute) exacerbation: Secondary | ICD-10-CM | POA: Diagnosis present

## 2023-03-14 DIAGNOSIS — G8929 Other chronic pain: Secondary | ICD-10-CM | POA: Diagnosis present

## 2023-03-14 DIAGNOSIS — G9340 Encephalopathy, unspecified: Secondary | ICD-10-CM | POA: Insufficient documentation

## 2023-03-14 LAB — RESPIRATORY PANEL BY PCR

## 2023-03-14 LAB — AMMONIA: Ammonia: 33 umol/L (ref 9–35)

## 2023-03-14 LAB — CBC WITH DIFFERENTIAL/PLATELET
Abs Immature Granulocytes: 0.04 10*3/uL (ref 0.00–0.07)
Basophils Absolute: 0 10*3/uL (ref 0.0–0.1)
Basophils Relative: 0 %
Eosinophils Absolute: 0.2 10*3/uL (ref 0.0–0.5)
Eosinophils Relative: 1 %
HCT: 43.6 % (ref 36.0–46.0)
Hemoglobin: 13.4 g/dL (ref 12.0–15.0)
Immature Granulocytes: 0 %
Lymphocytes Relative: 7 %
Lymphs Abs: 0.9 10*3/uL (ref 0.7–4.0)
MCH: 28.2 pg (ref 26.0–34.0)
MCHC: 30.7 g/dL (ref 30.0–36.0)
MCV: 91.6 fL (ref 80.0–100.0)
Monocytes Absolute: 0.7 10*3/uL (ref 0.1–1.0)
Monocytes Relative: 6 %
Neutro Abs: 10.6 10*3/uL — ABNORMAL HIGH (ref 1.7–7.7)
Neutrophils Relative %: 86 %
Platelets: 139 10*3/uL — ABNORMAL LOW (ref 150–400)
RBC: 4.76 MIL/uL (ref 3.87–5.11)
RDW: 14 % (ref 11.5–15.5)
WBC: 12.4 10*3/uL — ABNORMAL HIGH (ref 4.0–10.5)
nRBC: 0 % (ref 0.0–0.2)

## 2023-03-14 LAB — BLOOD GAS, ARTERIAL
Acid-Base Excess: 8.3 mmol/L — ABNORMAL HIGH (ref 0.0–2.0)
Bicarbonate: 36.4 mmol/L — ABNORMAL HIGH (ref 20.0–28.0)
O2 Content: 3 L/min
O2 Saturation: 96.9 %
Patient temperature: 37
pCO2 arterial: 66 mmHg (ref 32–48)
pH, Arterial: 7.35 (ref 7.35–7.45)
pO2, Arterial: 77 mmHg — ABNORMAL LOW (ref 83–108)

## 2023-03-14 LAB — URINE DRUG SCREEN, QUALITATIVE (ARMC ONLY)
Amphetamines, Ur Screen: NOT DETECTED
Barbiturates, Ur Screen: NOT DETECTED
Benzodiazepine, Ur Scrn: POSITIVE — AB
Cannabinoid 50 Ng, Ur ~~LOC~~: NOT DETECTED
Cocaine Metabolite,Ur ~~LOC~~: NOT DETECTED
MDMA (Ecstasy)Ur Screen: NOT DETECTED
Methadone Scn, Ur: NOT DETECTED
Opiate, Ur Screen: NOT DETECTED
Phencyclidine (PCP) Ur S: NOT DETECTED
Tricyclic, Ur Screen: NOT DETECTED

## 2023-03-14 LAB — LACTIC ACID, PLASMA
Lactic Acid, Venous: 1.2 mmol/L (ref 0.5–1.9)
Lactic Acid, Venous: 0.9 mmol/L (ref 0.5–1.9)

## 2023-03-14 LAB — COMPREHENSIVE METABOLIC PANEL
ALT: 9 U/L (ref 0–44)
AST: 22 U/L (ref 15–41)
Albumin: 4.2 g/dL (ref 3.5–5.0)
Alkaline Phosphatase: 68 U/L (ref 38–126)
Anion gap: 9 (ref 5–15)
BUN: 8 mg/dL (ref 8–23)
CO2: 33 mmol/L — ABNORMAL HIGH (ref 22–32)
Calcium: 8.9 mg/dL (ref 8.9–10.3)
Chloride: 97 mmol/L — ABNORMAL LOW (ref 98–111)
Creatinine, Ser: 0.71 mg/dL (ref 0.44–1.00)
GFR, Estimated: >60 mL/min (ref >60)
Glucose, Bld: 131 mg/dL — ABNORMAL HIGH (ref 70–99)
Potassium: 4.3 mmol/L (ref 3.5–5.1)
Sodium: 139 mmol/L (ref 135–145)
Total Bilirubin: 0.7 mg/dL (ref 0.3–1.2)
Total Protein: 7.9 g/dL (ref 6.5–8.1)

## 2023-03-14 LAB — URINALYSIS, ROUTINE W REFLEX MICROSCOPIC
Bacteria, UA: NONE SEEN
Bilirubin Urine: NEGATIVE
Glucose, UA: NEGATIVE mg/dL
Ketones, ur: NEGATIVE mg/dL
Leukocytes,Ua: NEGATIVE
Nitrite: NEGATIVE
Protein, ur: 100 mg/dL — AB
Specific Gravity, Urine: 1.013 (ref 1.005–1.030)
Squamous Epithelial / HPF: NONE SEEN /HPF (ref 0–5)
pH: 6 (ref 5.0–8.0)

## 2023-03-14 LAB — CULTURE, BLOOD (ROUTINE X 2)

## 2023-03-14 LAB — TROPONIN I (HIGH SENSITIVITY)
Troponin I (High Sensitivity): 10 ng/L (ref <18)
Troponin I (High Sensitivity): 12 ng/L (ref <18)

## 2023-03-14 LAB — PROTIME-INR
INR: 1 (ref 0.8–1.2)
Prothrombin Time: 13.2 seconds (ref 11.4–15.2)

## 2023-03-14 LAB — RESP PANEL BY RT-PCR (RSV, FLU A&B, COVID)  RVPGX2
Influenza A by PCR: NEGATIVE
Influenza B by PCR: NEGATIVE
Resp Syncytial Virus by PCR: NEGATIVE
SARS Coronavirus 2 by RT PCR: NEGATIVE

## 2023-03-14 LAB — APTT: aPTT: 36 seconds (ref 24–36)

## 2023-03-14 LAB — STREP PNEUMONIAE URINARY ANTIGEN: Strep Pneumo Urinary Antigen: NEGATIVE

## 2023-03-14 LAB — HIV ANTIBODY (ROUTINE TESTING W REFLEX): HIV Screen 4th Generation wRfx: NONREACTIVE

## 2023-03-14 LAB — TSH: TSH: 0.504 u[IU]/mL (ref 0.350–4.500)

## 2023-03-14 LAB — PROCALCITONIN: Procalcitonin: 0.34 ng/mL

## 2023-03-14 LAB — ETHANOL: Alcohol, Ethyl (B): <10 mg/dL (ref <10)

## 2023-03-14 MED ORDER — BUPRENORPHINE HCL-NALOXONE HCL 8-2 MG SL SUBL
0.5000 | SUBLINGUAL_TABLET | Freq: Once | SUBLINGUAL | Status: AC
  Administered 2023-03-14: 0.5 via SUBLINGUAL

## 2023-03-14 MED ORDER — ACETAMINOPHEN 325 MG RE SUPP
975.0000 mg | Freq: Once | RECTAL | Status: AC
  Administered 2023-03-14: 975 mg via RECTAL

## 2023-03-14 MED ORDER — VANCOMYCIN HCL 1750 MG/350ML IV SOLN
1750.0000 mg | Freq: Once | INTRAVENOUS | Status: AC
  Administered 2023-03-14: 1750 mg via INTRAVENOUS
  Filled 2023-03-14: qty 350

## 2023-03-14 MED ORDER — METRONIDAZOLE 500 MG/100ML IV SOLN
500.0000 mg | Freq: Once | INTRAVENOUS | Status: AC
  Administered 2023-03-14: 500 mg via INTRAVENOUS
  Filled 2023-03-14: qty 100

## 2023-03-14 MED ORDER — ONDANSETRON HCL 4 MG/2ML IJ SOLN: 4.0000 mg | Freq: Three times a day (TID) | INTRAMUSCULAR | Status: DC | PRN

## 2023-03-14 MED ORDER — HYDROMORPHONE HCL 1 MG/ML IJ SOLN: 0.5000 mg | INTRAMUSCULAR | Status: AC | PRN

## 2023-03-14 MED ORDER — SODIUM CHLORIDE 0.9 % IV BOLUS (SEPSIS)
1000.0000 mL | Freq: Once | INTRAVENOUS | Status: AC
  Administered 2023-03-14: 1000 mL via INTRAVENOUS

## 2023-03-14 MED ORDER — SODIUM CHLORIDE 0.9 % IV SOLN
2.0000 g | Freq: Once | INTRAVENOUS | Status: AC
  Administered 2023-03-14: 2 g via INTRAVENOUS
  Filled 2023-03-14: qty 10

## 2023-03-14 MED ORDER — NICOTINE 14 MG/24HR TD PT24
MEDICATED_PATCH | TRANSDERMAL | Status: AC
  Administered 2023-03-14: 14 mg via TRANSDERMAL
  Filled 2023-03-14: qty 1

## 2023-03-14 MED ORDER — ONDANSETRON HCL 4 MG PO TABS: 4.0000 mg | ORAL_TABLET | Freq: Four times a day (QID) | ORAL | Status: DC | PRN

## 2023-03-14 MED ORDER — SODIUM CHLORIDE 0.9 % IV SOLN
500.0000 mg | INTRAVENOUS | Status: DC
  Administered 2023-03-14 – 2023-03-15 (×2): 500 mg via INTRAVENOUS
  Filled 2023-03-14 (×2): qty 5
  Filled 2023-03-14: qty 500

## 2023-03-14 MED ORDER — NICOTINE 14 MG/24HR TD PT24
14.0000 mg | MEDICATED_PATCH | Freq: Every day | TRANSDERMAL | Status: DC
  Administered 2023-03-14 – 2023-03-16 (×3): 14 mg via TRANSDERMAL
  Filled 2023-03-14 (×2): qty 1

## 2023-03-14 MED ORDER — SODIUM CHLORIDE 0.9 % IV SOLN
2.0000 g | INTRAVENOUS | Status: DC
  Administered 2023-03-14 – 2023-03-15 (×2): 2 g via INTRAVENOUS
  Filled 2023-03-14: qty 20
  Filled 2023-03-14: qty 2
  Filled 2023-03-14: qty 20

## 2023-03-14 MED ORDER — BUPRENORPHINE HCL-NALOXONE HCL 8-2 MG SL SUBL
SUBLINGUAL_TABLET | SUBLINGUAL | Status: AC
  Filled 2023-03-14: qty 1

## 2023-03-14 MED ORDER — ONDANSETRON HCL 4 MG/2ML IJ SOLN: 4.0000 mg | Freq: Four times a day (QID) | INTRAMUSCULAR | Status: DC | PRN

## 2023-03-14 MED ORDER — ACETAMINOPHEN 325 MG RE SUPP
925.0000 mg | Freq: Once | RECTAL | Status: DC
  Filled 2023-03-14: qty 5

## 2023-03-14 MED ORDER — MELATONIN 5 MG PO TABS
10.0000 mg | ORAL_TABLET | Freq: Once | ORAL | Status: AC
  Administered 2023-03-14: 10 mg via ORAL
  Filled 2023-03-14: qty 2

## 2023-03-14 MED ORDER — METHYLPREDNISOLONE SODIUM SUCC 125 MG IJ SOLR
125.0000 mg | INTRAMUSCULAR | Status: DC
  Administered 2023-03-14 – 2023-03-15 (×2): 125 mg via INTRAVENOUS
  Filled 2023-03-14 (×2): qty 2

## 2023-03-14 MED ORDER — SODIUM CHLORIDE 0.9 % IV SOLN: INTRAVENOUS | Status: DC

## 2023-03-14 MED ORDER — ALBUTEROL SULFATE (2.5 MG/3ML) 0.083% IN NEBU: 2.5000 mg | INHALATION_SOLUTION | RESPIRATORY_TRACT | Status: AC | PRN

## 2023-03-14 MED ORDER — GABAPENTIN 300 MG PO CAPS
300.0000 mg | ORAL_CAPSULE | Freq: Once | ORAL | Status: AC
  Administered 2023-03-14: 300 mg via ORAL

## 2023-03-14 MED ORDER — ENOXAPARIN SODIUM 40 MG/0.4ML IJ SOSY
40.0000 mg | PREFILLED_SYRINGE | INTRAMUSCULAR | Status: DC
  Administered 2023-03-14 – 2023-03-16 (×3): 40 mg via SUBCUTANEOUS
  Filled 2023-03-14 (×3): qty 0.4

## 2023-03-14 MED ORDER — VANCOMYCIN HCL IN DEXTROSE 1-5 GM/200ML-% IV SOLN
1000.0000 mg | Freq: Once | INTRAVENOUS | Status: DC
  Filled 2023-03-14: qty 200

## 2023-03-14 NOTE — ED Triage Notes (Signed)
To room 5 from home via [facility]. Family called out for dyspnea, pt stumbling around the house and shivering. Pt noted to have temp 102.5 oral, tachycardic and lethargic.  Upon arrival to room, pt lethargic, able to follow commands.

## 2023-03-14 NOTE — Assessment & Plan Note (Addendum)
Acute toxic metabolic encephalopathy in setting of active sepsis, pneumonia ABG pending in setting of COPD Check ammonia level UDS alcohol level pending given history of alcohol and substance abuse Treat pneumonia and COPD flare Check ammonia level Mild sedating medications Follow

## 2023-03-14 NOTE — ED Notes (Signed)
Son called for update, phone given to pt to speak with son. Son given update by this RN. No further questions to this RN. Pt back to sleep after hanging up phone.

## 2023-03-14 NOTE — Progress Notes (Signed)
CODE SEPSIS - PHARMACY COMMUNICATION  **Broad Spectrum Antibiotics should be administered within 1 hour of Sepsis diagnosis**  Time Code Sepsis Called/Page Received: 1610  Antibiotics Ordered: Aztreonam & Vancomycin  Time of 1st antibiotic administration: 0425  Otelia Sergeant, PharmD, [facility] 03/14/2023 4:28 AM

## 2023-03-14 NOTE — Assessment & Plan Note (Signed)
Meet sepsis criteria by Tmax of 102.7, heart rate 100s, white count 12.4 Lactate 1.2 Noted respiratory source with right lower lobe pneumonia on imaging Initially placed on broad-spectrum aztreonam, Flagyl and vancomycin for broad-spectrum coverage Will transition to Rocephin azithromycin for respiratory coverage Panculture Urine strep and Legionella, sputum culture Respiratory panel Follow

## 2023-03-14 NOTE — Assessment & Plan Note (Signed)
UDS pending  

## 2023-03-14 NOTE — ED Notes (Signed)
Able to wake pt up with verbal stimulation and light touch. Pt oriented to self and location but disoriented to time and situation. Pt quickly back to sleep. Provider notified.

## 2023-03-14 NOTE — ED Provider Notes (Signed)
[facility] Provider Note    Event Date/Time   First MD Initiated Contact with Patient 03/14/23 [phone]     (approximate)  History   Chief Complaint: Fatigue  HPI  Caroline Coleman is a 67 y.o. female with a past medical anxiety, COPD, presents to the emergency department for confusion and dyspnea.  According to EMS they were called to the patient's house as the patient appeared confused and was "stumbling around" the house shivering with chills.  Found to have temperature of 102.5 by EMS, seemed confused and somnolent.  Here the patient is somnolent awakens easily to voice however she does appear confused she was able to tell me she is in the hospital but was unable to tell me the year, most of her responses are mumbling and largely unintelligible.  Patient has borderline temperature 99.6 in the emergency department she is tachycardic and tachypneic they have occasional wet sounding cough.  Patient currently on 3 L nasal cannula reported baseline of 2 L nasal cannula.  Physical Exam   Triage Vital Signs: ED Triage Vitals  Enc Vitals Group     BP 03/14/23 0406 122/84     Pulse Rate 03/14/23 0406 (!) 114     Resp 03/14/23 0406 (!) 21     Temp 03/14/23 0406 99.6 F (37.6 C)     Temp Source 03/14/23 0406 Oral     SpO2 03/14/23 0406 95 %     Weight 03/14/23 0407 170 lb 13.7 oz (77.5 kg)     Height 03/14/23 0407  (1.626 m)     Head Circumference --      Peak Flow --      Pain Score 03/14/23 0407 4     Pain Loc --      Pain Edu? --      Excl. in GC? --     Most recent vital signs: Vitals:   03/14/23 0406  BP: 122/84  Pulse: (!) 114  Resp: (!) 21  Temp: 99.6 F (37.6 C)  SpO2: 95%    General: Somnolent but awakens to voice.  Appears confused. CV:  Good peripheral perfusion.  Regular rhythm rate around 120 bpm. Resp:  Mild tachypnea but no obvious wheeze rales or rhonchi.  Occasional cough. Abd:  No distention.  Soft, nontender.  No rebound or  guarding.  ED Results / Procedures / Treatments   RADIOLOGY  I have reviewed and read the x-ray images.  Patient appears to have opacities in right lower lung. Radiology confirms likely bronchopneumonia in the right lower lung.  MEDICATIONS ORDERED IN ED: Medications  sodium chloride 0.9 % bolus 1,000 mL (has no administration in time range)  aztreonam (AZACTAM) 2 g in sodium chloride 0.9 % 100 mL IVPB (2 g Intravenous New Bag/Given 03/14/23 0425)  metroNIDAZOLE (FLAGYL) IVPB 500 mg (has no administration in time range)  vancomycin (VANCOREADY) IVPB 1750 mg/350 mL (has no administration in time range)  acetaminophen (TYLENOL) suppository 975 mg (has no administration in time range)     IMPRESSION / MDM / ASSESSMENT AND PLAN / ED COURSE  I reviewed the triage vital signs and the nursing notes.  Patient's presentation is most consistent with acute presentation with potential threat to life or bodily function.  Patient presents emergency department for confusion fatigue reportedly febrile to 102.5.  Given the patient's reported fever tachycardia and tachypnea she meets sepsis criteria.  We will check labs, send cultures, start on broad-spectrum antibiotics, IV hydrate  and continue to closely monitor.  Differential is quite broad but would include infectious etiologies such as urinary tract infection, pneumonia, COVID/influenza.  No abdominal pain or tenderness.  Chest x-ray shows likely pneumonia.  White blood cell count slightly elevated at 12,000, urinalysis is normal, COVID/flu/RSV is normal, chemistry shows no significant findings.  Given the patient's fever tachycardia confusion and x-ray concerning for pneumonia we will cover with broad-spectrum antibiotics.  Patient will be admitted to the hospital service for further workup and treatment.  CRITICAL CARE Performed by: Minna Antis   Total critical care time: 30 minutes  Critical care time was exclusive of separately billable  procedures and treating other patients.  Critical care was necessary to treat or prevent imminent or life-threatening deterioration.  Critical care was time spent personally by me on the following activities: development of treatment plan with patient and/or surrogate as well as nursing, discussions with consultants, evaluation of patient's response to treatment, examination of patient, obtaining history from patient or surrogate, ordering and performing treatments and interventions, ordering and review of laboratory studies, ordering and review of radiographic studies, pulse oximetry and re-evaluation of patient's condition.   FINAL CLINICAL IMPRESSION(S) / ED DIAGNOSES   Confusion Sepsis Weakness Pneumonia   Note:  This document was prepared using Dragon voice recognition software and may include unintentional dictation errors.   Minna Antis, MD 03/14/23 0700

## 2023-03-14 NOTE — Sepsis Progress Note (Signed)
Elink monitoring for the code sepsis protocol.  

## 2023-03-14 NOTE — ED Notes (Addendum)
Pt bedding wet with sweat. Bedding changed, gown changed. Pt repositioned for comfort. Pillow and new blanket provided. Pt much easier to arouse at this time but is still drowsy, AO4/GCS15. Pt updated on status and informed that Viviann Spare is aware. VSS, NAD. WCTM.

## 2023-03-14 NOTE — Assessment & Plan Note (Signed)
Check alcohol level and UDS

## 2023-03-14 NOTE — ED Notes (Signed)
Attempted to call son Caroline Coleman to provide update, no answer at this time.

## 2023-03-14 NOTE — ED Notes (Signed)
Per son, pt wears 2L [place] at home all the time  Son steven updated on status and plan of care, no further questions to this RN.

## 2023-03-14 NOTE — ED Notes (Signed)
Oxygen titrated to 2L [place] which Is baseline for pt. Tolerating well, WCTM.

## 2023-03-14 NOTE — Assessment & Plan Note (Signed)
Baseline COPD on 3 L at home On 3 to 4 L with mild increased work of breathing in the setting of pneumonia ABG pending Mild wheezing on exam Add on IV steroids and DuoNebs to help with respiratory status Follow

## 2023-03-14 NOTE — Assessment & Plan Note (Signed)
Meet sepsis criteria by Tmax of one 2.7, heart rate 100s, white count 12.4 Lactate 1.2 Noted respiratory source with right lower lobe pneumonia on imaging Initially placed on broad-spectrum aztreonam, Flagyl and vancomycin for broad-spectrum coverage Will transition to Rocephin azithromycin for respiratory coverage Panculture Urine strep and Legionella, sputum culture Respiratory panel Follow

## 2023-03-14 NOTE — Progress Notes (Signed)
Dr. Arta Silence [phone]) who is patient's care provided with the pace program reach out to discuss patient Made aware of admission for sepsis, acute on chronic respiratory failure with hypoxia and pneumonia She is available for any questions pertaining to the patient from a management standpoint Will also coordinate with social work for discharge with pace program Follow

## 2023-03-14 NOTE — H&P (Addendum)
History and Physical    Patient: Caroline Coleman:[id number] DOB: October 09, 1956 DOA: 03/14/2023 DOS: the patient was seen and examined on 03/14/2023 PCP: Hillery Aldo, MD  Patient coming from: Home  Chief Complaint:  Chief Complaint  Patient presents with   Fatigue   HPI: Caroline Coleman is a 67 y.o. female with medical history significant of COPD on 3 L O2, anxiety, alcohol abuse, prior history of substance abuse, thyroid disorder presenting with sepsis, encephalopathy, pneumonia, COPD exacerbation.  Limited history in the setting of encephalopathy.  Per report, patient found to be lethargic with increased work of breathing at home.  Family subsequently called EMS for further evaluation.  No reports of chest pain, nausea or vomiting.  Patient noted to have been admitted April 6 through April 7 for similar issues associated with COPD.  Noted baseline tobacco abuse.  Unclear if patient is still using any tobacco alcohol or illicit drugs at present. Presented to the ER Tmax 102.7, heart rate 100s, BP stable, satting 95% on 3 to 4 L nasal cannula.  White count 12.4, hemoglobin 13.4, platelets 139, urinalysis grossly stable, COVID flu and RSV negative, creatinine 0.7, glucose 131.  Chest x-ray with developing right lung pneumonia. Review of Systems: As mentioned in the history of present illness. All other systems reviewed and are negative. Past Medical History:  Diagnosis Date   Anxiety    COPD (chronic obstructive pulmonary disease)    Depression    Thyroid disease    Past Surgical History:  Procedure Laterality Date   EYE SURGERY     HIP SURGERY     Social History:  reports that she has been smoking cigarettes. She has a 22.50 pack-year smoking history. She uses smokeless tobacco. She reports that she does not drink alcohol and does not use drugs.  Allergies  Allergen Reactions   Penicillins Other (See Comments)    Has patient had a PCN reaction causing immediate rash,  facial/tongue/throat swelling, SOB or lightheadedness with hypotension: no Has patient had a PCN reaction causing severe rash involving mucus membranes or skin necrosis: no Has patient had a PCN reaction that required hospitalization no  REACTION UNKNOWN Has patient had a PCN reaction occurring within the last 10 years: yes If all of the above answers are "NO", then may proceed with Cephalosporin use.     Sulfa Antibiotics Other (See Comments)    REACTION UNKNOWN    Family History  Problem Relation Age of Onset   Breast cancer Mother    Heart attack Father    Heart attack Brother     Prior to Admission medications   Medication Sig Start Date End Date Taking? Authorizing Provider  albuterol (PROVENTIL) (2.5 MG/3ML) 0.083% nebulizer solution Take 2.5 mg by nebulization every 6 (six) hours as needed for wheezing or shortness of breath.    [provider]  albuterol (VENTOLIN HFA) 108 (90 Base) MCG/ACT inhaler Inhale 2 puffs into the lungs every 6 (six) hours as needed for wheezing or shortness of breath. 07/03/19   Concha Se, MD  dextromethorphan-guaiFENesin [facility] DM) 30-600 MG 12hr tablet Take 1 tablet by mouth 2 (two) times daily as needed for cough. 03/04/22   Arnetha Courser, MD  gabapentin (NEURONTIN) 400 MG capsule Take 1 capsule (400 mg total) by mouth 2 (two) times daily. Patient taking differently: Take 800 mg by mouth 4 (four) times daily. Take (2) capsules by mouth 4 times daily. 06/06/17 03/03/22  Myrna Blazer, MD  ipratropium-albuterol (  DUONEB) 0.5-2.5 (3) MG/3ML SOLN Take 3 mLs by nebulization every 8 (eight) hours as needed. 03/04/22   Arnetha Courser, MD  Levothyroxine Sodium 137 MCG CAPS Take 137 mcg by mouth daily before breakfast.    [provider]  mirtazapine (REMERON) 15 MG tablet Take 15 mg by mouth at bedtime. 02/18/22   [provider]  naproxen sodium (ALEVE) 220 MG tablet Take 220 mg by mouth.    [provider]   PARoxetine (PAXIL) 40 MG tablet Take 1 tablet (40 mg total) by mouth daily. 07/27/17 03/03/22  Tommi Rumps, PA-C  predniSONE (DELTASONE) 50 MG tablet 1 tablet daily for 5 days 03/04/22   Arnetha Courser, MD  simvastatin (ZOCOR) 40 MG tablet Take 40 mg by mouth daily. 10/01/20   [provider]  SPIRIVA HANDIHALER 18 MCG inhalation capsule Place 1 capsule into inhaler and inhale daily. 12/31/21   [provider]    Physical Exam: Vitals:   03/14/23 0407 03/14/23 0437 03/14/23 0700 03/14/23 0728  BP:   (!) 107/51   Pulse:   93   Resp:   20   Temp:  (!) 102.7 F (39.3 C)  99.4 F (37.4 C)  TempSrc:  Rectal  Rectal  SpO2:   95%   Weight: 77.5 kg     Height:  (1.626 m)      Physical Exam Constitutional:      Appearance: She is obese.     Comments: Patient lethargic at the bedside  HENT:     Head: Normocephalic.     Nose: Nose normal.     Mouth/Throat:     Mouth: Mucous membranes are moist.  Eyes:     Pupils: Pupils are equal, round, and reactive to light.  Cardiovascular:     Rate and Rhythm: Normal rate and regular rhythm.  Pulmonary:     Effort: Pulmonary effort is normal.     Breath sounds: Wheezing and rales present.  Abdominal:     General: Abdomen is flat. Bowel sounds are normal.  Musculoskeletal:        General: Normal range of motion.  Skin:    General: Skin is warm.  Neurological:     General: No focal deficit present.     Comments: Positive generalized lethargy Otherwise grossly stable neuroexam  Psychiatric:        Mood and Affect: Mood normal.     Data Reviewed:  There are no new results to review at this time.  Assessment and Plan: * Sepsis Meet sepsis criteria by Tmax of 102.7, heart rate 100s, white count 12.4 Lactate 1.2 Noted respiratory source with right lower lobe pneumonia on imaging Initially placed on broad-spectrum aztreonam, Flagyl and vancomycin for broad-spectrum coverage Will transition to Rocephin azithromycin  for respiratory coverage Panculture Urine strep and Legionella, sputum culture Respiratory panel Follow  Acute on chronic respiratory failure with hypoxia Baseline COPD on 3 L at home On 3 to 4 L with mild increased work of breathing in the setting of pneumonia ABG pending Mild wheezing on exam Add on IV steroids and DuoNebs to help with respiratory status Follow  Alcohol abuse Check alcohol level and UDS  Acute encephalopathy Acute toxic metabolic encephalopathy in setting of active sepsis, pneumonia ABG pending in setting of COPD Check ammonia level UDS alcohol level pending given history of alcohol and substance abuse Treat pneumonia and COPD flare Check ammonia level Mild sedating medications Follow   Pneumonia Noted new right lower lobe  pneumonia with progressive hypoxia and lethargy COVID flu and RSV negative Will place on IV Rocephin and azithromycin for infectious coverage for now Urine strep Legionella, sputum culture, expanded respiratory panel Procalcitonin Noted recent admission for COPD flare Escalate antibiotics as appropriate Supplemental oxygen as needed Follow   Substance induced mood disorder UDS pending      Advance Care Planning:   Code Status: Full Code   Consults: None   Family Communication: No family at the bedside   Severity of Illness: The appropriate patient status for this patient is INPATIENT. Inpatient status is judged to be reasonable and necessary in order to provide the required intensity of service to ensure the patient's safety. The patient's presenting symptoms, physical exam findings, and initial radiographic and laboratory data in the context of their chronic comorbidities is felt to place them at high risk for further clinical deterioration. Furthermore, it is not anticipated that the patient will be medically stable for discharge from the hospital within 2 midnights of admission.   * I certify that at the point of  admission it is my clinical judgment that the patient will require inpatient hospital care spanning beyond 2 midnights from the point of admission due to high intensity of service, high risk for further deterioration and high frequency of surveillance required.*  Author: Floydene Flock, MD 03/14/2023 8:03 AM  For on call review [email or web address].

## 2023-03-14 NOTE — Assessment & Plan Note (Signed)
Noted new right lower lobe pneumonia with progressive hypoxia and lethargy COVID flu and RSV negative Will place on IV Rocephin and azithromycin for infectious coverage for now Urine strep Legionella, sputum culture, expanded respiratory panel Procalcitonin Noted recent admission for COPD flare Escalate antibiotics as appropriate Supplemental oxygen as needed Follow

## 2023-03-14 NOTE — Progress Notes (Signed)
PHARMACY -  BRIEF ANTIBIOTIC NOTE   Pharmacy has received consult(s) for Vancomycin from an ED provider.  The patient's profile has been reviewed for ht/wt/allergies/indication/available labs.    One time order(s) placed for Vancomycin 1750 mg per pt wt: 77.5 kg.  Further antibiotics/pharmacy consults should be ordered by admitting physician if indicated.                       Thank you, Otelia Sergeant, PharmD, [facility] 03/14/2023 4:24 AM

## 2023-03-14 NOTE — ED Notes (Signed)
Pt noted to be attempting to climb out of bed. Pt redirected verbally and assisted back on stretcher to position of comfort. Fall alarm activated with alarms audible. Non slip socks put on pt, and fall risk bracelet. Stretcher locked in lowest position with side rails up and call bell placed in pt hand.

## 2023-03-15 DIAGNOSIS — J189 Pneumonia, unspecified organism: Secondary | ICD-10-CM | POA: Diagnosis not present

## 2023-03-15 DIAGNOSIS — J9621 Acute and chronic respiratory failure with hypoxia: Secondary | ICD-10-CM | POA: Diagnosis not present

## 2023-03-15 DIAGNOSIS — F419 Anxiety disorder, unspecified: Secondary | ICD-10-CM

## 2023-03-15 DIAGNOSIS — A419 Sepsis, unspecified organism: Secondary | ICD-10-CM | POA: Diagnosis not present

## 2023-03-15 LAB — CBC
HCT: 34.4 % — ABNORMAL LOW (ref 36.0–46.0)
Hemoglobin: 10.6 g/dL — ABNORMAL LOW (ref 12.0–15.0)
MCH: 28 pg (ref 26.0–34.0)
MCHC: 30.8 g/dL (ref 30.0–36.0)
MCV: 90.8 fL (ref 80.0–100.0)
Platelets: 125 10*3/uL — ABNORMAL LOW (ref 150–400)
RBC: 3.79 MIL/uL — ABNORMAL LOW (ref 3.87–5.11)
RDW: 14.1 % (ref 11.5–15.5)
WBC: 10.2 10*3/uL (ref 4.0–10.5)
nRBC: 0 % (ref 0.0–0.2)

## 2023-03-15 LAB — URINE CULTURE: Culture: NO GROWTH

## 2023-03-15 LAB — COMPREHENSIVE METABOLIC PANEL
ALT: 9 U/L (ref 0–44)
AST: 17 U/L (ref 15–41)
Albumin: 3.1 g/dL — ABNORMAL LOW (ref 3.5–5.0)
Alkaline Phosphatase: 52 U/L (ref 38–126)
Anion gap: 7 (ref 5–15)
BUN: 12 mg/dL (ref 8–23)
CO2: 30 mmol/L (ref 22–32)
Calcium: 8.9 mg/dL (ref 8.9–10.3)
Chloride: 104 mmol/L (ref 98–111)
Creatinine, Ser: 0.51 mg/dL (ref 0.44–1.00)
GFR, Estimated: >60 mL/min (ref >60)
Glucose, Bld: 122 mg/dL — ABNORMAL HIGH (ref 70–99)
Potassium: 3.9 mmol/L (ref 3.5–5.1)
Sodium: 141 mmol/L (ref 135–145)
Total Bilirubin: 0.5 mg/dL (ref 0.3–1.2)
Total Protein: 6.3 g/dL — ABNORMAL LOW (ref 6.5–8.1)

## 2023-03-15 LAB — CULTURE, BLOOD (ROUTINE X 2): Culture: NO GROWTH

## 2023-03-15 MED ORDER — FLUTICASONE FUROATE-VILANTEROL 100-25 MCG/ACT IN AEPB
1.0000 | INHALATION_SPRAY | Freq: Every day | RESPIRATORY_TRACT | Status: DC
  Administered 2023-03-15: 1 via RESPIRATORY_TRACT
  Filled 2023-03-15: qty 28

## 2023-03-15 MED ORDER — METHYLPREDNISOLONE SODIUM SUCC 125 MG IJ SOLR
60.0000 mg | INTRAMUSCULAR | Status: DC
  Filled 2023-03-15: qty 2

## 2023-03-15 MED ORDER — UMECLIDINIUM BROMIDE 62.5 MCG/ACT IN AEPB
1.0000 | INHALATION_SPRAY | Freq: Every day | RESPIRATORY_TRACT | Status: DC
  Administered 2023-03-15: 1 via RESPIRATORY_TRACT
  Filled 2023-03-15: qty 7

## 2023-03-15 MED ORDER — BUPRENORPHINE HCL-NALOXONE HCL 8-2 MG SL SUBL
0.5000 | SUBLINGUAL_TABLET | Freq: Two times a day (BID) | SUBLINGUAL | Status: DC
  Administered 2023-03-15 – 2023-03-16 (×3): 0.5 via SUBLINGUAL
  Filled 2023-03-15 (×3): qty 1

## 2023-03-15 MED ORDER — SENNA 8.6 MG PO TABS: 2.0000 | ORAL_TABLET | Freq: Two times a day (BID) | ORAL | Status: DC | PRN

## 2023-03-15 MED ORDER — LEVOTHYROXINE SODIUM 137 MCG PO TABS
137.0000 ug | ORAL_TABLET | Freq: Every day | ORAL | Status: DC
  Administered 2023-03-16: 137 ug via ORAL
  Filled 2023-03-15: qty 1

## 2023-03-15 MED ORDER — BRIMONIDINE TARTRATE-TIMOLOL 0.2-0.5 % OP SOLN
1.0000 [drp] | Freq: Two times a day (BID) | OPHTHALMIC | Status: DC
  Filled 2023-03-15: qty 5

## 2023-03-15 MED ORDER — VITAMIN D 25 MCG (1000 UNIT) PO TABS
1000.0000 [IU] | ORAL_TABLET | Freq: Every day | ORAL | Status: DC
  Administered 2023-03-15 – 2023-03-16 (×2): 1000 [IU] via ORAL
  Filled 2023-03-15 (×2): qty 1

## 2023-03-15 MED ORDER — GABAPENTIN 300 MG PO CAPS
300.0000 mg | ORAL_CAPSULE | Freq: Two times a day (BID) | ORAL | Status: DC
  Administered 2023-03-15 – 2023-03-16 (×3): 300 mg via ORAL
  Filled 2023-03-15 (×3): qty 1

## 2023-03-15 MED ORDER — MIRTAZAPINE 15 MG PO TABS
15.0000 mg | ORAL_TABLET | Freq: Every day | ORAL | Status: DC
  Administered 2023-03-15: 15 mg via ORAL
  Filled 2023-03-15: qty 1

## 2023-03-15 MED ORDER — ACETAMINOPHEN 325 MG PO TABS
650.0000 mg | ORAL_TABLET | Freq: Four times a day (QID) | ORAL | Status: DC | PRN
  Administered 2023-03-15: 650 mg via ORAL
  Filled 2023-03-15: qty 2

## 2023-03-15 MED ORDER — BRIMONIDINE TARTRATE 0.2 % OP SOLN
1.0000 [drp] | Freq: Two times a day (BID) | OPHTHALMIC | Status: DC
  Administered 2023-03-15 (×2): 1 [drp] via OPHTHALMIC
  Filled 2023-03-15: qty 5

## 2023-03-15 MED ORDER — TIMOLOL MALEATE 0.5 % OP SOLN
1.0000 [drp] | Freq: Two times a day (BID) | OPHTHALMIC | Status: DC
  Filled 2023-03-15: qty 5

## 2023-03-15 MED ORDER — LORAZEPAM 0.5 MG PO TABS
0.5000 mg | ORAL_TABLET | Freq: Every day | ORAL | Status: DC
  Administered 2023-03-15 – 2023-03-16 (×2): 0.5 mg via ORAL
  Filled 2023-03-15 (×2): qty 1

## 2023-03-15 MED ORDER — DULOXETINE HCL 30 MG PO CPEP
60.0000 mg | ORAL_CAPSULE | Freq: Every day | ORAL | Status: DC
  Administered 2023-03-15 – 2023-03-16 (×2): 60 mg via ORAL
  Filled 2023-03-15 (×2): qty 2

## 2023-03-15 MED ORDER — POLYETHYLENE GLYCOL 3350 17 G PO PACK: 17.0000 g | PACK | Freq: Every day | ORAL | Status: DC | PRN

## 2023-03-15 MED ORDER — ROSUVASTATIN CALCIUM 10 MG PO TABS
20.0000 mg | ORAL_TABLET | Freq: Every day | ORAL | Status: DC
  Administered 2023-03-15: 20 mg via ORAL
  Filled 2023-03-15: qty 2

## 2023-03-15 NOTE — TOC Initial Note (Signed)
Transition of Care [facility]) - Initial/Assessment Note    Patient Details  Name: Caroline Coleman MRN: [id number] Date of Birth: [birth year]  [facility] CM/SW Contact:    Margarito Liner, LCSW Phone Number: 03/15/2023, 2:23 PM  Clinical Narrative:  Received call back from [place], patient's social worker from [facility]. Patient does not go to their facility every day. If patient is able, they can pick her up at discharge and bring her to their office to be assessed for any home needs.                Expected Discharge Plan: Home/Self Care Barriers to Discharge: Continued Medical Work up   Patient Goals and CMS Choice            Expected Discharge Plan and Services       Living arrangements for the past 2 months: Apartment                                      Prior Living Arrangements/Services Living arrangements for the past 2 months: Apartment   Patient language and need for interpreter reviewed:: Yes              Criminal Activity/Legal Involvement Pertinent to Current Situation/Hospitalization: No - Comment as needed  Activities of Daily Living Home Assistive Devices/Equipment: None ADL Screening (condition at time of admission) Patient's cognitive ability adequate to safely complete daily activities?: Yes Is the patient deaf or have difficulty hearing?: No Does the patient have difficulty seeing, even when wearing glasses/contacts?: No Does the patient have difficulty concentrating, remembering, or making decisions?: No Patient able to express need for assistance with ADLs?: Yes Does the patient have difficulty dressing or bathing?: No Independently performs ADLs?: Yes (appropriate for developmental age) Does the patient have difficulty walking or climbing stairs?: No Weakness of Legs: None Weakness of Arms/Hands: None  Permission Sought/Granted                  Emotional Assessment       Orientation: : Oriented to Self, Oriented to  Place, Oriented to  Time, Oriented to Situation Alcohol / Substance Use: Not Applicable Psych Involvement: No (comment)  Admission diagnosis:  Sepsis [A41.9] Community acquired pneumonia of right lower lobe of lung [J18.9] Sepsis, due to unspecified organism, unspecified whether acute organ dysfunction present [A41.9] Patient Active Problem List   Diagnosis Date Noted   Sepsis 03/14/2023   Acute encephalopathy 03/14/2023   Acute on chronic respiratory failure with hypoxia 03/03/2022   Acute exacerbation of chronic obstructive pulmonary disease (COPD) 08/16/2021   Pneumonia 06/25/2021   HLD (hyperlipidemia) 06/25/2021   Depression with anxiety 06/25/2021   Tobacco abuse 06/25/2021   Acute respiratory failure with hypoxia 06/25/2021   COPD exacerbation 01/27/2021   Nicotine dependence 01/27/2021   Acute respiratory failure 08/24/2018   Self-inflicted laceration of wrist 08/03/2016   Substance induced mood disorder 08/03/2016   Alcohol abuse 08/03/2016   Panic disorder with agoraphobia 12/14/2015   Insomnia 12/14/2015   Depression 12/14/2015   Acquired hypothyroidism 05/04/2015   Colon polyps 05/04/2015   COPD, moderate 05/04/2015   Edentulous 05/04/2015   Family history of early CAD 05/04/2015   Generalized anxiety disorder 05/04/2015   Goiter 05/04/2015   Pure hypercholesterolemia 05/04/2015   S/P lens implant 05/04/2015   PCP:  Hillery Aldo, MD Pharmacy:   [facility] - [place], [place] -  [address] [place] [address] Phone: [phone] Fax: [phone]     Social Determinants of Health (SDOH) Social History: SDOH Screenings   Food Insecurity: No Food Insecurity (03/15/2023)  Housing: Low Risk  (03/15/2023)  Transportation Needs: No Transportation Needs (03/15/2023)  Utilities: Not At Risk (03/15/2023)  Tobacco Use: High Risk (03/14/2023)   SDOH Interventions:     Readmission Risk Interventions     No data to display

## 2023-03-15 NOTE — Progress Notes (Signed)
This Mirna Mires was introduced to [place] while rounding 2A. By the Unit director and the nurse. Pt in the room and wanting someone to talk to. Chap offered reflective listening as wells as a prayer for the Pt and her family. Pt was appreciative.   03/15/23 1500  Spiritual Encounters  Type of Visit Initial  Care provided to: Patient  Conversation partners present during encounter Nurse  Referral source Patient request  Reason for visit Routine spiritual support  OnCall Visit No  Spiritual Framework  Presenting Themes Meaning/purpose/sources of inspiration;Courage hope and growth  Community/Connection Family  Family Stress Factors Health changes;Family relationships  Interventions  Spiritual Care Interventions Made Compassionate presence;Reflective listening;Prayer  Intervention Outcomes  Outcomes Reduced anxiety;Awareness of support;Connection to spiritual care  Spiritual Care Plan  Spiritual Care Issues Still Outstanding No further spiritual care needs at this time (see row info)

## 2023-03-15 NOTE — TOC CM/SW Note (Signed)
Contacted [facility]. They will have her assigned social worker call back.  Charlynn Court, CSW [phone]

## 2023-03-15 NOTE — Progress Notes (Signed)
[facility]  - [place] [facility]   PATIENT NAME: Caroline Coleman    MR#:  [id number]  DATE OF BIRTH:  09-Jun-1956  SUBJECTIVE:  patient sitting out in the chair came in with increasing shortness of breath and some cough. Feeling a bit better. Uses oxygen at home mainly at nighttime. Uses during daytime as needed. Continues to smoke. No family at bedside. No fever.    VITALS:  Blood pressure 131/71, pulse 95, temperature 98.6 F (37 C), resp. rate 18, height  (1.626 m), weight 77.3 kg, SpO2 100 %.  PHYSICAL EXAMINATION:   GENERAL:  67 y.o.-year-old patient with no acute distress.  LUNGS: Normal breath sounds bilaterally, no wheezing CARDIOVASCULAR: S1, S2 normal. No murmur   ABDOMEN: Soft, nontender, nondistended. Bowel sounds present.  EXTREMITIES: No  edema b/l.    NEUROLOGIC: nonfocal  patient is alert and awake SKIN: No obvious rash, lesion, or ulcer.   LABORATORY PANEL:  CBC Recent Labs  Lab 03/15/23 0556  WBC 10.2  HGB 10.6*  HCT 34.4*  PLT 125*    Chemistries  Recent Labs  Lab 03/15/23 0556  NA 141  K 3.9  CL 104  CO2 30  GLUCOSE 122*  BUN 12  CREATININE 0.51  CALCIUM 8.9  AST 17  ALT 9  ALKPHOS 52  BILITOT 0.5   Cardiac Enzymes No results for input(s): "TROPONINI" in the last 168 hours. RADIOLOGY:  DG Chest Portable 1 View  Result Date: 03/14/2023 CLINICAL DATA:  67 year old female with history of shortness of breath, altered mental status, fever, tachycardia and lethargy. EXAM: PORTABLE CHEST 1 VIEW COMPARISON:  Chest x-ray 03/03/2022. FINDINGS: Lung volumes are normal. Widespread interstitial prominence and patchy peribronchial cuffing, most evident in the mid to lower lungs bilaterally. Patchy ill-defined opacities in the right lung base also noted. No pleural effusions. No pneumothorax. No evidence of pulmonary edema. Heart size is upper limits of normal. The patient is rotated to the right on today's exam, resulting in  distortion of the mediastinal contours and reduced diagnostic sensitivity and specificity for mediastinal pathology. IMPRESSION: 1. The appearance of the chest is concerning for an acute bronchitis, potentially with developing bronchopneumonia in the right lung base. Followup PA and lateral chest X-ray is recommended in 3-4 weeks following trial of antibiotic therapy to ensure resolution and exclude underlying malignancy. Electronically Signed   By: Trudie Reed M.D.   On: 03/14/2023 05:26    Assessment and Plan  Caroline Coleman is a 67 y.o. female with medical history significant of COPD on 3 L O2, anxiety, alcohol abuse, prior history of substance abuse, thyroid disorder presenting with sepsis, encephalopathy, pneumonia, COPD exacerbation.  Limited history in the setting of encephalopathy.  Per report, patient found to be lethargic with increased work of breathing at home.  Family subsequently called EMS for further evaluation.  COVID flu and RSV negative   Sepsis due to right lower lobe pneumonia acute on chronic COPD exacerbation/chronic home oxygen -- came in with altered mental status, tachycardia, shortness of breath, elevated white count and chest x-ray suggest pneumonia right lower lobe -- continue broad-spectrum antibiotic, IV Solu-Medrol,  nebulizer, bronchodilator -- continue oxygen -- white count normalized to 10.2 no fever overall improving  Acute metabolic encephalopathy in the setting of sepsis and hypoxia -- mentation improved  Chronic pain -- patient on Suboxone  Hypothyroidism -- continue Synthroid  Chronic anxiety -- resume home meds  Tobacco use -- patient advised smoking cessation  Patient tolerating PO diet well. Continues to feel better. If remains stable will discharge to home tomorrow. Patient in agreement.    Procedures: Family communication : none Consults : none CODE STATUS: full DVT Prophylaxis : Lovenox Level of care: Progressive Status is:  Inpatient Remains inpatient appropriate because: sepsis with pneumonia and COPD exacerbation    TOTAL TIME TAKING CARE OF THIS PATIENT: 35 minutes.  >50% time spent on counselling and coordination of care  Note: This dictation was prepared with Dragon dictation along with smaller phrase technology. Any transcriptional errors that result from this process are unintentional.  Enedina Finner M.D    [facility]   CC: Primary care physician; Hillery Aldo, MD

## 2023-03-16 DIAGNOSIS — J189 Pneumonia, unspecified organism: Secondary | ICD-10-CM | POA: Diagnosis not present

## 2023-03-16 DIAGNOSIS — J9621 Acute and chronic respiratory failure with hypoxia: Secondary | ICD-10-CM | POA: Diagnosis not present

## 2023-03-16 DIAGNOSIS — A419 Sepsis, unspecified organism: Secondary | ICD-10-CM | POA: Diagnosis not present

## 2023-03-16 DIAGNOSIS — F1994 Other psychoactive substance use, unspecified with psychoactive substance-induced mood disorder: Secondary | ICD-10-CM

## 2023-03-16 LAB — LEGIONELLA PNEUMOPHILA SEROGP 1 UR AG: L. pneumophila Serogp 1 Ur Ag: NEGATIVE

## 2023-03-16 MED ORDER — CEFDINIR 300 MG PO CAPS
300.0000 mg | ORAL_CAPSULE | Freq: Two times a day (BID) | ORAL | Status: DC
  Administered 2023-03-16: 300 mg via ORAL
  Filled 2023-03-16: qty 1

## 2023-03-16 MED ORDER — CEFDINIR 300 MG PO CAPS: 300.0000 mg | ORAL_CAPSULE | Freq: Two times a day (BID) | ORAL | 0 refills | Status: AC

## 2023-03-16 MED ORDER — NICOTINE 14 MG/24HR TD PT24: 14.0000 mg | MEDICATED_PATCH | Freq: Every day | TRANSDERMAL | 0 refills | Status: AC

## 2023-03-16 MED ORDER — PREDNISONE 20 MG PO TABS: 20.0000 mg | ORAL_TABLET | Freq: Every day | ORAL | Status: DC

## 2023-03-16 MED ORDER — AZITHROMYCIN 250 MG PO TABS: ORAL_TABLET | ORAL | 0 refills | Status: DC

## 2023-03-16 MED ORDER — AZITHROMYCIN 250 MG PO TABS
500.0000 mg | ORAL_TABLET | Freq: Every day | ORAL | Status: DC
  Administered 2023-03-16: 500 mg via ORAL
  Filled 2023-03-16: qty 2

## 2023-03-16 MED ORDER — PREDNISONE 20 MG PO TABS: 20.0000 mg | ORAL_TABLET | Freq: Every day | ORAL | 0 refills | Status: AC

## 2023-03-16 NOTE — Discharge Instructions (Signed)
Smoking cessation advised.

## 2023-03-16 NOTE — Plan of Care (Signed)

## 2023-03-16 NOTE — TOC Transition Note (Signed)
Transition of Care [facility]) - CM/SW Discharge Note   Patient Details  Name: Caroline Coleman MRN: [id number] Date of Birth: 24-Oct-1956  Transition of Care [facility]) CM/SW Contact:  Margarito Liner, LCSW Phone Number: 03/16/2023, 10:53 AM   Clinical Narrative:   Patient has orders to discharge home today. [facility] will be here around 12:00 to pick her up. No further concerns. CSW signing off.  Final next level of care: Home/Self Care Barriers to Discharge: Barriers Resolved   Patient Goals and CMS Choice      Discharge Placement                  Patient to be transferred to facility by: [facility]   Patient and family notified of of transfer: 03/16/23  Discharge Plan and Services Additional resources added to the After Visit Summary for                                       Social Determinants of Health (SDOH) Interventions SDOH Screenings   Food Insecurity: No Food Insecurity (03/15/2023)  Housing: Low Risk  (03/15/2023)  Transportation Needs: No Transportation Needs (03/15/2023)  Utilities: Not At Risk (03/15/2023)  Tobacco Use: High Risk (03/14/2023)     Readmission Risk Interventions     No data to display

## 2023-03-16 NOTE — Discharge Summary (Signed)
Physician Discharge Summary   Patient: Caroline Coleman MRN: [id number] DOB: Nov 29, 1955  Admit date:     03/14/2023  Discharge date: 03/16/23  Discharge Physician: Enedina Finner   PCP: Hillery Aldo, MD   Recommendations at discharge:    F/u [facility] PCP in 1-2 weeks Pt advised smoking cessation  Discharge Diagnoses: Principal Problem:   Sepsis Active Problems:   Acute on chronic respiratory failure with hypoxia   Alcohol abuse   Substance induced mood disorder   Community acquired pneumonia of right lower lobe of lung   Acute encephalopathy   Anxiety  Caroline Coleman is a 67 y.o. female with medical history significant of COPD on 3 L O2, anxiety, alcohol abuse, prior history of substance abuse, thyroid disorder presenting with sepsis, encephalopathy, pneumonia, COPD exacerbation.  Limited history in the setting of encephalopathy.  Per report, patient found to be lethargic with increased work of breathing at home.  Family subsequently called EMS for further evaluation.  COVID flu and RSV negative    Sepsis due to right lower lobe pneumonia acute on chronic COPD exacerbation/chronic home oxygen @ night -- came in with altered mental status, tachycardia, shortness of breath, elevated white count and chest x-ray suggest          pneumonia right lower lobe -- continue broad-spectrum antibiotic, IV Solu-Medrol,  nebulizer, bronchodilator -- continue oxygen -- white count normalized to 10.2 no fever overall improving --sepsis resovled --pt feels a lot better --advised to use her oxygen at night and prn daytime   Acute metabolic encephalopathy in the setting of sepsis and hypoxia -- mentation improved   Chronic pain -- patient on Suboxone   Hypothyroidism -- continue Synthroid   Chronic anxiety -- resume home meds   Tobacco use -- patient advised smoking cessation   Patient tolerating PO diet well.  D/c home today. Pt is agreeable    Procedures:none Family communication :  none Consults : none CODE STATUS: full DVT Prophylaxis : Lovenox     DISCHARGE MEDICATION: Allergies as of 03/16/2023       Reactions   Sulfa Antibiotics Other (See Comments), Swelling   REACTION UNKNOWN Per patient - both hands swell. Hands draw up    REACTION UNKNOWN   Penicillins Other (See Comments)   Has patient had a PCN reaction causing immediate rash, facial/tongue/throat swelling, SOB or lightheadedness with hypotension: no Has patient had a PCN reaction causing severe rash involving mucus membranes or skin necrosis: no Has patient had a PCN reaction that required hospitalization no REACTION UNKNOWN Has patient had a PCN reaction occurring within the last 10 years: yes If all of the above answers are "NO", then may proceed with Cephalosporin use.        Medication List     STOP taking these medications    naproxen sodium 220 MG tablet Commonly known as: ALEVE       TAKE these medications    acetaminophen 650 MG CR tablet Commonly known as: TYLENOL Take 1,300 mg by mouth 2 (two) times daily.   albuterol 108 (90 Base) MCG/ACT inhaler Commonly known as: VENTOLIN HFA Inhale 2 puffs into the lungs every 6 (six) hours as needed for wheezing or shortness of breath. What changed: Another medication with the same name was removed. Continue taking this medication, and follow the directions you see here.   azithromycin 250 MG tablet Commonly known as: ZITHROMAX Take daily  for 3 days   cefdinir 300 MG capsule Commonly known  as: OMNICEF Take 1 capsule (300 mg total) by mouth every 12 (twelve) hours for 5 days.   cholecalciferol 25 MCG (1000 UNIT) tablet Commonly known as: VITAMIN D3 Take 1,000 Units by mouth daily.   Combigan 0.2-0.5 % ophthalmic solution Generic drug: brimonidine-timolol Place 1 drop into the left eye every 12 (twelve) hours.   dextromethorphan-guaiFENesin 30-600 MG 12hr tablet Commonly known as: MUCINEX DM Take 1 tablet by mouth 2  (two) times daily as needed for cough.   DULoxetine 60 MG capsule Commonly known as: CYMBALTA Take 60 mg by mouth daily.   Flonase Allergy Relief 50 MCG/ACT nasal spray Generic drug: fluticasone Place 2 sprays into both nostrils daily.   gabapentin 300 MG capsule Commonly known as: NEURONTIN Take 300 mg by mouth 2 (two) times daily.   ipratropium-albuterol 0.5-2.5 (3) MG/3ML Soln Commonly known as: DUONEB Take 3 mLs by nebulization every 8 (eight) hours as needed.   levothyroxine 137 MCG tablet Commonly known as: SYNTHROID Take 137 mcg by mouth daily.   LORazepam 0.5 MG tablet Commonly known as: ATIVAN Take 0.5 mg by mouth daily.   mirtazapine 15 MG tablet Commonly known as: REMERON Take 15 mg by mouth at bedtime.   Narcan 4 MG/0.1ML Liqd nasal spray kit Generic drug: naloxone Place 0.4 mg into the nose once.   nicotine 14 mg/24hr patch Commonly known as: NICODERM CQ - dosed in mg/24 hours Place 1 patch (14 mg total) onto the skin daily. Start taking on: March 17, 2023   polyethylene glycol powder 17 GM/SCOOP powder Commonly known as: GLYCOLAX/MIRALAX Take 17 g by mouth daily.   predniSONE 20 MG tablet Commonly known as: DELTASONE Take 1 tablet (20 mg total) by mouth daily with breakfast for 3 days. Start taking on: March 17, 2023   rosuvastatin 20 MG tablet Commonly known as: CRESTOR Take 20 mg by mouth at bedtime.   senna 8.6 MG Tabs tablet Commonly known as: SENOKOT Take 2 tablets by mouth 2 (two) times daily.   Suboxone 8-2 MG Film Generic drug: Buprenorphine HCl-Naloxone HCl Place 0.5 Film under the tongue 2 (two) times daily.   Trelegy Ellipta 100-62.5-25 MCG/ACT Aepb Generic drug: Fluticasone-Umeclidin-Vilant Inhale 1 puff into the lungs daily.        Follow-up Information     [facility] Go in 1 week(s).   Why: hospital f/u Contact information: [address] [place] [address] [phone]                  Filed Weights   03/14/23 0407 03/15/23 0406 03/16/23 0417  Weight: 77.5 kg 77.3 kg 79.6 kg     Condition at discharge: fair  The results of significant diagnostics from this hospitalization (including imaging, microbiology, ancillary and laboratory) are listed below for reference.   Imaging Studies: DG Chest Portable 1 View  Result Date: 03/14/2023 CLINICAL DATA:  67 year old female with history of shortness of breath, altered mental status, fever, tachycardia and lethargy. EXAM: PORTABLE CHEST 1 VIEW COMPARISON:  Chest x-ray 03/03/2022. FINDINGS: Lung volumes are normal. Widespread interstitial prominence and patchy peribronchial cuffing, most evident in the mid to lower lungs bilaterally. Patchy ill-defined opacities in the right lung base also noted. No pleural effusions. No pneumothorax. No evidence of pulmonary edema. Heart size is upper limits of normal. The patient is rotated to the right on today's exam, resulting in distortion of the mediastinal contours and reduced diagnostic sensitivity and specificity for mediastinal pathology. IMPRESSION: 1. The appearance of  the chest is concerning for an acute bronchitis, potentially with developing bronchopneumonia in the right lung base. Followup PA and lateral chest X-ray is recommended in 3-4 weeks following trial of antibiotic therapy to ensure resolution and exclude underlying malignancy. Electronically Signed   By: Trudie Reed M.D.   On: 03/14/2023 05:26    Microbiology: Results for orders placed or performed during the hospital encounter of 03/14/23  Blood culture (routine x 2)     Status: None (Preliminary result)   Collection Time: 03/14/23  4:11 AM   Specimen: BLOOD  Result Value Ref Range Status   Specimen Description BLOOD RIGHT FA  Final   Special Requests   Final    BOTTLES DRAWN AEROBIC AND ANAEROBIC Blood Culture adequate volume   Culture   Final    NO GROWTH 1 DAY Performed at [facility], [address], [place], [place] [address]    Report Status PENDING  Incomplete  Resp panel by RT-PCR (RSV, Flu A&B, Covid) Anterior Nasal Swab     Status: None   Collection Time: 03/14/23  4:12 AM   Specimen: Anterior Nasal Swab  Result Value Ref Range Status   SARS Coronavirus 2 by RT PCR NEGATIVE NEGATIVE Final    Comment: (NOTE) SARS-CoV-2 target nucleic acids are NOT DETECTED.  The SARS-CoV-2 RNA is generally detectable in upper respiratory specimens during the acute phase of infection. The lowest concentration of SARS-CoV-2 viral copies this assay can detect is 138 copies/mL. A negative result does not preclude SARS-Cov-2 infection and should not be used as the sole basis for treatment or other patient management decisions. A negative result may occur with  improper specimen collection/handling, submission of specimen other than nasopharyngeal swab, presence of viral mutation(s) within the areas targeted by this assay, and inadequate number of viral copies(<138 copies/mL). A negative result must be combined with clinical observations, patient history, and epidemiological information. The expected result is Negative.  Fact Sheet for Patients:  BloggerCourse.com  Fact Sheet for Healthcare Providers:  SeriousBroker.it  This test is no t yet approved or cleared by the [place] FDA and  has been authorized for detection and/or diagnosis of SARS-CoV-2 by FDA under an Emergency Use Authorization (EUA). This EUA will remain  in effect (meaning this test can be used) for the duration of the COVID-19 declaration under Section 564(b)(1) of the Act, 21 U.S.C.section 360bbb-3(b)(1), unless the authorization is terminated  or revoked sooner.       Influenza A by PCR NEGATIVE NEGATIVE Final   Influenza B by PCR NEGATIVE NEGATIVE Final    Comment: (NOTE) The Xpert Xpress SARS-CoV-2/FLU/RSV plus assay is intended as an aid in the diagnosis of  influenza from Nasopharyngeal swab specimens and should not be used as a sole basis for treatment. Nasal washings and aspirates are unacceptable for Xpert Xpress SARS-CoV-2/FLU/RSV testing.  Fact Sheet for Patients: BloggerCourse.com  Fact Sheet for Healthcare Providers: SeriousBroker.it  This test is not yet approved or cleared by the [place] FDA and has been authorized for detection and/or diagnosis of SARS-CoV-2 by FDA under an Emergency Use Authorization (EUA). This EUA will remain in effect (meaning this test can be used) for the duration of the COVID-19 declaration under Section 564(b)(1) of the Act, 21 U.S.C. section 360bbb-3(b)(1), unless the authorization is terminated or revoked.     Resp Syncytial Virus by PCR NEGATIVE NEGATIVE Final    Comment: (NOTE) Fact Sheet for Patients: BloggerCourse.com  Fact Sheet for Healthcare Providers:  SeriousBroker.it  This test is not yet approved or cleared by the [place] and has been authorized for detection and/or diagnosis of SARS-CoV-2 by FDA under an Emergency Use Authorization (EUA). This EUA will remain in effect (meaning this test can be used) for the duration of the COVID-19 declaration under Section 564(b)(1) of the Act, 21 U.S.C. section 360bbb-3(b)(1), unless the authorization is terminated or revoked.  Performed at [facility], [address] [place], [place] [address]   Blood culture (routine x 2)     Status: None (Preliminary result)   Collection Time: 03/14/23  4:12 AM   Specimen: BLOOD  Result Value Ref Range Status   Specimen Description BLOOD LEFT FA  Final   Special Requests   Final    BOTTLES DRAWN AEROBIC AND ANAEROBIC Blood Culture adequate volume   Culture   Final    NO GROWTH 1 DAY Performed at [facility], [address] [place], [place] [address]    Report  Status PENDING  Incomplete  Urine Culture (for pregnant, neutropenic or urologic patients or patients with an indwelling urinary catheter)     Status: None   Collection Time: 03/14/23  4:19 AM   Specimen: Urine, Clean Catch  Result Value Ref Range Status   Specimen Description   Final    URINE, CLEAN CATCH Performed at [facility], [address], [place], [place] [address]    Special Requests   Final    NONE Performed at [facility], [address], [place], [place] [address]    Culture   Final    NO GROWTH Performed at [facility], [address], [place], [place] [address]    Report Status 03/15/2023 FINAL  Final  Respiratory (~20 pathogens) panel by PCR     Status: None   Collection Time: 03/14/23  8:28 AM   Specimen: Nasopharyngeal Swab; Respiratory  Result Value Ref Range Status   Adenovirus NOT DETECTED NOT DETECTED Final   Coronavirus 229E NOT DETECTED NOT DETECTED Final    Comment: (NOTE) The Coronavirus on the Respiratory Panel, DOES NOT test for the novel  Coronavirus (2019 nCoV)    Coronavirus HKU1 NOT DETECTED NOT DETECTED Final   Coronavirus NL63 NOT DETECTED NOT DETECTED Final   Coronavirus OC43 NOT DETECTED NOT DETECTED Final   Metapneumovirus NOT DETECTED NOT DETECTED Final   Rhinovirus / Enterovirus NOT DETECTED NOT DETECTED Final   Influenza A NOT DETECTED NOT DETECTED Final   Influenza B NOT DETECTED NOT DETECTED Final   Parainfluenza Virus 1 NOT DETECTED NOT DETECTED Final   Parainfluenza Virus 2 NOT DETECTED NOT DETECTED Final   Parainfluenza Virus 3 NOT DETECTED NOT DETECTED Final   Parainfluenza Virus 4 NOT DETECTED NOT DETECTED Final   Respiratory Syncytial Virus NOT DETECTED NOT DETECTED Final   Bordetella pertussis NOT DETECTED NOT DETECTED Final   Bordetella Parapertussis NOT DETECTED NOT DETECTED Final   Chlamydophila pneumoniae NOT DETECTED NOT DETECTED Final   Mycoplasma pneumoniae NOT DETECTED NOT DETECTED Final     Comment: Performed at [facility], [address], [place], [place] [address]    Labs: CBC: Recent Labs  Lab 03/14/23 0411 03/15/23 0556  WBC 12.4* 10.2  NEUTROABS 10.6*  --   HGB 13.4 10.6*  HCT 43.6 34.4*  MCV 91.6 90.8  PLT 139* 125*   Basic Metabolic Panel: Recent Labs  Lab 03/14/23 0411 03/15/23 0556  NA 139 141  K 4.3  3.9  CL 97* 104  CO2 33* 30  GLUCOSE 131* 122*  BUN 8 12  CREATININE 0.71 0.51  CALCIUM 8.9 8.9   Liver Function Tests: Recent Labs  Lab 03/14/23 0411 03/15/23 0556  AST 22 17  ALT 9 9  ALKPHOS 68 52  BILITOT 0.7 0.5  PROT 7.9 6.3*  ALBUMIN 4.2 3.1*    Discharge time spent: greater than 30 minutes.  Signed: Enedina Finner, MD [facility] 03/16/2023

## 2023-03-17 LAB — CULTURE, BLOOD (ROUTINE X 2)

## 2023-03-18 LAB — CULTURE, BLOOD (ROUTINE X 2)

## 2023-03-19 LAB — CULTURE, BLOOD (ROUTINE X 2)
Culture: NO GROWTH
Special Requests: ADEQUATE
Special Requests: ADEQUATE

## 2023-07-04 ENCOUNTER — Ambulatory Visit
Admission: RE | Admit: 2023-07-04 | Discharge: 2023-07-04 | Disposition: A | Discharge status: Home / Self Care | Payer: Medicare (Managed Care) | Source: Ambulatory Visit | Attending: Family Medicine | Admitting: Family Medicine

## 2023-07-04 DIAGNOSIS — Z1231 Encounter for screening mammogram for malignant neoplasm of breast: Secondary | ICD-10-CM | POA: Insufficient documentation

## 2023-07-11 ENCOUNTER — Other Ambulatory Visit: Payer: Self-pay | Admitting: Family Medicine

## 2023-07-11 DIAGNOSIS — R928 Other abnormal and inconclusive findings on diagnostic imaging of breast: Secondary | ICD-10-CM

## 2023-07-12 ENCOUNTER — Encounter: Payer: Self-pay | Admitting: *Deleted

## 2023-07-20 ENCOUNTER — Ambulatory Visit
Admission: RE | Admit: 2023-07-20 | Discharge: 2023-07-20 | Disposition: A | Discharge status: Home / Self Care | Payer: Medicare (Managed Care) | Source: Ambulatory Visit | Attending: Family Medicine | Admitting: Family Medicine

## 2023-07-20 DIAGNOSIS — R928 Other abnormal and inconclusive findings on diagnostic imaging of breast: Secondary | ICD-10-CM | POA: Diagnosis present

## 2023-09-03 ENCOUNTER — Other Ambulatory Visit: Payer: Self-pay

## 2023-09-03 ENCOUNTER — Inpatient Hospital Stay
Admission: EM | Admit: 2023-09-03 | Discharge: 2023-09-06 | DRG: 189 | Disposition: A | Discharge status: Home Health | Payer: Medicare (Managed Care) | Attending: Internal Medicine | Admitting: Internal Medicine

## 2023-09-03 ENCOUNTER — Emergency Department: Payer: Medicare (Managed Care)

## 2023-09-03 DIAGNOSIS — J9621 Acute and chronic respiratory failure with hypoxia: Principal | ICD-10-CM | POA: Diagnosis present

## 2023-09-03 DIAGNOSIS — Z8249 Family history of ischemic heart disease and other diseases of the circulatory system: Secondary | ICD-10-CM | POA: Diagnosis not present

## 2023-09-03 DIAGNOSIS — F419 Anxiety disorder, unspecified: Secondary | ICD-10-CM | POA: Diagnosis present

## 2023-09-03 DIAGNOSIS — J441 Chronic obstructive pulmonary disease with (acute) exacerbation: Principal | ICD-10-CM | POA: Diagnosis present

## 2023-09-03 DIAGNOSIS — Z79899 Other long term (current) drug therapy: Secondary | ICD-10-CM | POA: Diagnosis not present

## 2023-09-03 DIAGNOSIS — Z880 Allergy status to penicillin: Secondary | ICD-10-CM | POA: Diagnosis not present

## 2023-09-03 DIAGNOSIS — F32A Depression, unspecified: Secondary | ICD-10-CM | POA: Diagnosis present

## 2023-09-03 DIAGNOSIS — Z79890 Hormone replacement therapy (postmenopausal): Secondary | ICD-10-CM | POA: Diagnosis not present

## 2023-09-03 DIAGNOSIS — R4182 Altered mental status, unspecified: Secondary | ICD-10-CM | POA: Diagnosis not present

## 2023-09-03 DIAGNOSIS — J9612 Chronic respiratory failure with hypercapnia: Secondary | ICD-10-CM | POA: Diagnosis not present

## 2023-09-03 DIAGNOSIS — Z9981 Dependence on supplemental oxygen: Secondary | ICD-10-CM | POA: Diagnosis not present

## 2023-09-03 DIAGNOSIS — R400 Somnolence: Secondary | ICD-10-CM | POA: Diagnosis present

## 2023-09-03 DIAGNOSIS — Z882 Allergy status to sulfonamides status: Secondary | ICD-10-CM | POA: Diagnosis not present

## 2023-09-03 DIAGNOSIS — J9601 Acute respiratory failure with hypoxia: Secondary | ICD-10-CM | POA: Diagnosis not present

## 2023-09-03 DIAGNOSIS — Z803 Family history of malignant neoplasm of breast: Secondary | ICD-10-CM

## 2023-09-03 DIAGNOSIS — Z7951 Long term (current) use of inhaled steroids: Secondary | ICD-10-CM | POA: Diagnosis not present

## 2023-09-03 DIAGNOSIS — G9341 Metabolic encephalopathy: Secondary | ICD-10-CM | POA: Diagnosis present

## 2023-09-03 DIAGNOSIS — J9622 Acute and chronic respiratory failure with hypercapnia: Secondary | ICD-10-CM | POA: Diagnosis present

## 2023-09-03 DIAGNOSIS — E039 Hypothyroidism, unspecified: Secondary | ICD-10-CM | POA: Diagnosis present

## 2023-09-03 DIAGNOSIS — J439 Emphysema, unspecified: Secondary | ICD-10-CM | POA: Diagnosis present

## 2023-09-03 DIAGNOSIS — F1011 Alcohol abuse, in remission: Secondary | ICD-10-CM | POA: Diagnosis present

## 2023-09-03 DIAGNOSIS — Z1152 Encounter for screening for COVID-19: Secondary | ICD-10-CM | POA: Diagnosis not present

## 2023-09-03 DIAGNOSIS — F17210 Nicotine dependence, cigarettes, uncomplicated: Secondary | ICD-10-CM | POA: Diagnosis present

## 2023-09-03 LAB — CBC WITH DIFFERENTIAL/PLATELET
Abs Immature Granulocytes: 0.02 10*3/uL (ref 0.00–0.07)
Basophils Absolute: 0 10*3/uL (ref 0.0–0.1)
Basophils Relative: 0 %
Eosinophils Absolute: 0.2 10*3/uL (ref 0.0–0.5)
Eosinophils Relative: 3 %
HCT: 43.3 % (ref 36.0–46.0)
Hemoglobin: 12.4 g/dL (ref 12.0–15.0)
Immature Granulocytes: 0 %
Lymphocytes Relative: 14 %
Lymphs Abs: 1.1 10*3/uL (ref 0.7–4.0)
MCH: 26.5 pg (ref 26.0–34.0)
MCHC: 28.6 g/dL — ABNORMAL LOW (ref 30.0–36.0)
MCV: 92.5 fL (ref 80.0–100.0)
Monocytes Absolute: 0.5 10*3/uL (ref 0.1–1.0)
Monocytes Relative: 7 %
Neutro Abs: 6.2 10*3/uL (ref 1.7–7.7)
Neutrophils Relative %: 76 %
Platelets: 118 10*3/uL — ABNORMAL LOW (ref 150–400)
RBC: 4.68 MIL/uL (ref 3.87–5.11)
RDW: 13.7 % (ref 11.5–15.5)
WBC: 8.1 10*3/uL (ref 4.0–10.5)
nRBC: 0 % (ref 0.0–0.2)

## 2023-09-03 LAB — RESPIRATORY PANEL BY PCR

## 2023-09-03 LAB — COMPREHENSIVE METABOLIC PANEL
ALT: 6 U/L (ref 0–44)
AST: 13 U/L — ABNORMAL LOW (ref 15–41)
Albumin: 4 g/dL (ref 3.5–5.0)
Alkaline Phosphatase: 74 U/L (ref 38–126)
Anion gap: 12 (ref 5–15)
BUN: 10 mg/dL (ref 8–23)
CO2: 38 mmol/L — ABNORMAL HIGH (ref 22–32)
Calcium: 9.1 mg/dL (ref 8.9–10.3)
Chloride: 88 mmol/L — ABNORMAL LOW (ref 98–111)
Creatinine, Ser: 0.63 mg/dL (ref 0.44–1.00)
GFR, Estimated: >60 mL/min (ref >60)
Glucose, Bld: 122 mg/dL — ABNORMAL HIGH (ref 70–99)
Potassium: 4 mmol/L (ref 3.5–5.1)
Sodium: 138 mmol/L (ref 135–145)
Total Bilirubin: 0.6 mg/dL (ref 0.3–1.2)
Total Protein: 7.6 g/dL (ref 6.5–8.1)

## 2023-09-03 LAB — URINALYSIS, ROUTINE W REFLEX MICROSCOPIC
Bilirubin Urine: NEGATIVE
Glucose, UA: NEGATIVE mg/dL
Hgb urine dipstick: NEGATIVE
Ketones, ur: NEGATIVE mg/dL
Leukocytes,Ua: NEGATIVE
Nitrite: NEGATIVE
Protein, ur: 30 mg/dL — AB
Specific Gravity, Urine: 1.029 (ref 1.005–1.030)
Squamous Epithelial / HPF: 0 /[HPF] (ref 0–5)
pH: 6 (ref 5.0–8.0)

## 2023-09-03 LAB — BLOOD GAS, ARTERIAL
Acid-Base Excess: 17.9 mmol/L — ABNORMAL HIGH (ref 0.0–2.0)
Bicarbonate: 48 mmol/L — ABNORMAL HIGH (ref 20.0–28.0)
O2 Content: 3 L/min
O2 Saturation: 94.2 %
Patient temperature: 37
pCO2 arterial: 85 mm[Hg] (ref 32–48)
pH, Arterial: 7.36 (ref 7.35–7.45)
pO2, Arterial: 61 mm[Hg] — ABNORMAL LOW (ref 83–108)

## 2023-09-03 LAB — MRSA NEXT GEN BY PCR, NASAL: MRSA by PCR Next Gen: DETECTED — AB

## 2023-09-03 LAB — TSH: TSH: 1.08 u[IU]/mL (ref 0.350–4.500)

## 2023-09-03 LAB — BRAIN NATRIURETIC PEPTIDE: B Natriuretic Peptide: 22.6 pg/mL (ref 0.0–100.0)

## 2023-09-03 LAB — URINE DRUG SCREEN, QUALITATIVE (ARMC ONLY)
Amphetamines, Ur Screen: NOT DETECTED
Barbiturates, Ur Screen: NOT DETECTED
Benzodiazepine, Ur Scrn: POSITIVE — AB
Cannabinoid 50 Ng, Ur ~~LOC~~: NOT DETECTED
Cocaine Metabolite,Ur ~~LOC~~: NOT DETECTED
MDMA (Ecstasy)Ur Screen: NOT DETECTED
Methadone Scn, Ur: NOT DETECTED
Opiate, Ur Screen: NOT DETECTED
Phencyclidine (PCP) Ur S: NOT DETECTED
Tricyclic, Ur Screen: NOT DETECTED

## 2023-09-03 LAB — TROPONIN I (HIGH SENSITIVITY)
Troponin I (High Sensitivity): 3 ng/L (ref <18)
Troponin I (High Sensitivity): 4 ng/L (ref <18)

## 2023-09-03 LAB — SARS CORONAVIRUS 2 BY RT PCR: SARS Coronavirus 2 by RT PCR: NEGATIVE

## 2023-09-03 LAB — ETHANOL: Alcohol, Ethyl (B): <10 mg/dL (ref <10)

## 2023-09-03 LAB — PROCALCITONIN: Procalcitonin: <0.1 ng/mL

## 2023-09-03 LAB — STREP PNEUMONIAE URINARY ANTIGEN: Strep Pneumo Urinary Antigen: NEGATIVE

## 2023-09-03 MED ORDER — ACETAMINOPHEN 10 MG/ML IV SOLN
1000.0000 mg | Freq: Four times a day (QID) | INTRAVENOUS | Status: DC
  Administered 2023-09-03: 1000 mg via INTRAVENOUS
  Filled 2023-09-03 (×3): qty 100

## 2023-09-03 MED ORDER — IPRATROPIUM-ALBUTEROL 0.5-2.5 (3) MG/3ML IN SOLN
3.0000 mL | Freq: Once | RESPIRATORY_TRACT | Status: AC
  Administered 2023-09-03: 3 mL via RESPIRATORY_TRACT
  Filled 2023-09-03: qty 3

## 2023-09-03 MED ORDER — ACETAMINOPHEN 10 MG/ML IV SOLN
1000.0000 mg | Freq: Four times a day (QID) | INTRAVENOUS | Status: AC
  Filled 2023-09-03 (×3): qty 100

## 2023-09-03 MED ORDER — ALBUTEROL SULFATE (2.5 MG/3ML) 0.083% IN NEBU
2.5000 mg | INHALATION_SOLUTION | Freq: Four times a day (QID) | RESPIRATORY_TRACT | Status: DC | PRN
  Administered 2023-09-05: 2.5 mg via RESPIRATORY_TRACT

## 2023-09-03 MED ORDER — ORAL CARE MOUTH RINSE: 15.0000 mL | OROMUCOSAL | Status: DC | PRN

## 2023-09-03 MED ORDER — ARFORMOTEROL TARTRATE 15 MCG/2ML IN NEBU
15.0000 ug | INHALATION_SOLUTION | Freq: Two times a day (BID) | RESPIRATORY_TRACT | Status: DC
  Administered 2023-09-03 – 2023-09-06 (×7): 15 ug via RESPIRATORY_TRACT
  Filled 2023-09-03 (×8): qty 2

## 2023-09-03 MED ORDER — IPRATROPIUM-ALBUTEROL 0.5-2.5 (3) MG/3ML IN SOLN
3.0000 mL | RESPIRATORY_TRACT | Status: DC
  Administered 2023-09-03 – 2023-09-04 (×10): 3 mL via RESPIRATORY_TRACT
  Filled 2023-09-03 (×10): qty 3

## 2023-09-03 MED ORDER — IPRATROPIUM-ALBUTEROL 0.5-2.5 (3) MG/3ML IN SOLN
3.0000 mL | Freq: Four times a day (QID) | RESPIRATORY_TRACT | Status: DC
  Administered 2023-09-03: 3 mL via RESPIRATORY_TRACT
  Filled 2023-09-03: qty 3

## 2023-09-03 MED ORDER — DOCUSATE SODIUM 100 MG PO CAPS: 100.0000 mg | ORAL_CAPSULE | Freq: Two times a day (BID) | ORAL | Status: DC | PRN

## 2023-09-03 MED ORDER — NALOXONE HCL 2 MG/2ML IJ SOSY
0.4000 mg | PREFILLED_SYRINGE | Freq: Once | INTRAMUSCULAR | Status: AC
  Administered 2023-09-03: 0.4 mg via INTRAVENOUS
  Filled 2023-09-03: qty 2

## 2023-09-03 MED ORDER — POLYETHYLENE GLYCOL 3350 17 G PO PACK: 17.0000 g | PACK | Freq: Every day | ORAL | Status: DC | PRN

## 2023-09-03 MED ORDER — LEVOTHYROXINE SODIUM 137 MCG PO TABS
137.0000 ug | ORAL_TABLET | Freq: Every day | ORAL | Status: DC
  Administered 2023-09-04 – 2023-09-06 (×3): 137 ug via ORAL
  Filled 2023-09-03 (×4): qty 1

## 2023-09-03 MED ORDER — SODIUM CHLORIDE 0.9 % IV SOLN
1.0000 g | Freq: Every day | INTRAVENOUS | Status: DC
  Administered 2023-09-03 – 2023-09-06 (×4): 1 g via INTRAVENOUS
  Filled 2023-09-03 (×4): qty 10

## 2023-09-03 MED ORDER — ACETAMINOPHEN 10 MG/ML IV SOLN: 1000.0000 mg | Freq: Four times a day (QID) | INTRAVENOUS | Status: DC

## 2023-09-03 MED ORDER — NALOXONE HCL 2 MG/2ML IJ SOSY
1.0000 mg | PREFILLED_SYRINGE | Freq: Once | INTRAMUSCULAR | Status: AC
  Administered 2023-09-03: 1 mg via INTRAVENOUS

## 2023-09-03 MED ORDER — PREDNISONE 20 MG PO TABS
40.0000 mg | ORAL_TABLET | Freq: Every day | ORAL | Status: DC
  Administered 2023-09-04 – 2023-09-05 (×2): 40 mg via ORAL
  Filled 2023-09-03 (×2): qty 2

## 2023-09-03 MED ORDER — IPRATROPIUM-ALBUTEROL 0.5-2.5 (3) MG/3ML IN SOLN: 3.0000 mL | Freq: Once | RESPIRATORY_TRACT | Status: DC

## 2023-09-03 MED ORDER — METHYLPREDNISOLONE SODIUM SUCC 125 MG IJ SOLR
125.0000 mg | Freq: Once | INTRAMUSCULAR | Status: AC
  Administered 2023-09-03: 125 mg via INTRAVENOUS
  Filled 2023-09-03: qty 2

## 2023-09-03 MED ORDER — MIRTAZAPINE 15 MG PO TABS
7.5000 mg | ORAL_TABLET | Freq: Every day | ORAL | Status: DC
  Administered 2023-09-03 – 2023-09-05 (×3): 7.5 mg via ORAL
  Filled 2023-09-03 (×3): qty 1

## 2023-09-03 MED ORDER — ORAL CARE MOUTH RINSE
15.0000 mL | OROMUCOSAL | Status: DC
  Administered 2023-09-03 – 2023-09-04 (×6): 15 mL via OROMUCOSAL

## 2023-09-03 MED ORDER — ENOXAPARIN SODIUM 40 MG/0.4ML IJ SOSY
40.0000 mg | PREFILLED_SYRINGE | INTRAMUSCULAR | Status: DC
  Administered 2023-09-03 – 2023-09-06 (×4): 40 mg via SUBCUTANEOUS
  Filled 2023-09-03 (×4): qty 0.4

## 2023-09-03 MED ORDER — SODIUM CHLORIDE 0.9 % IV SOLN
500.0000 mg | INTRAVENOUS | Status: DC
  Administered 2023-09-03 – 2023-09-05 (×3): 500 mg via INTRAVENOUS
  Filled 2023-09-03 (×3): qty 5

## 2023-09-03 MED ORDER — CHLORHEXIDINE GLUCONATE CLOTH 2 % EX PADS
6.0000 | MEDICATED_PAD | Freq: Every day | CUTANEOUS | Status: DC
  Administered 2023-09-03 – 2023-09-06 (×4): 6 via TOPICAL

## 2023-09-03 MED ORDER — METHYLPREDNISOLONE SODIUM SUCC 40 MG IJ SOLR
40.0000 mg | Freq: Two times a day (BID) | INTRAMUSCULAR | Status: AC
  Administered 2023-09-03: 40 mg via INTRAVENOUS
  Filled 2023-09-03: qty 1

## 2023-09-03 NOTE — H&P (Addendum)
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  Per ED reports, EMS was called by patient's son due to concern that she was going to fall.  On EMS arrival, patient was hypoxic with sats in the 70s with no oxygen.  Patient received albuterol treatment due to wheezing with increased in sats to 95% patient was complaining of shortness of breath, intermittent chest pain and headache.   ED Course: Initial vital signs showed HR of 85 beats/minute, BP 115/72 mm Hg, the RR 14 breaths/minute, and the oxygen saturation 90% on 3L and a temperature of 98.53F (37C).  Pertinent Labs/Diagnostics Findings: Glucose:122, CO2:38 Plts:118 PCT: negative <0.10  ABG: pO2 61; pCO2 85; pH 7.36;  HCO3 48 %O2 Sat 94.2.  CXR> CTH> negative  Due to patient's somnolence she received 2 rounds of Narcan without any improvement.  She was only arousable to vigorous sternal rub and to voice intermittently but remained drowsy.  Blood gas shows hypercapnia therefore she was placed on BiPAP.  Due to high risk for decompensation, PCCM consulted for admission.  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  10/6: Noncontrast CT head> IMPRESSION: 1. No acute  intracranial abnormality. 2. No acute displaced fracture or traumatic listhesis of the cervical spine. 3.  Emphysema (ICD10-J43.9).  Interim History / Subjective:      Micro Data:  10/6: SARS-CoV-2 PCR>  10/6: Influenza PCR>  10/6: Blood culture x2> 10/6: MRSA PCR>>  10/6: Strep pneumo urinary antigen> 10/6: Legionella urinary antigen>  Antimicrobials:  10/6:Azithromycin> 10/6: Ceftriaxone>  OBJECTIVE  Blood pressure 106/60, pulse 74, temperature 98.6 F (37 C), resp. rate 14, SpO2 92%.    FiO2 (%):  [35 %] 35 %  No intake or output data in the 24 hours ending 09/03/23 0546 There were no vitals filed for this visit.   Physical Examination  GENERAL: 67 year-old critically ill patient lying in the bed on BiPAP EYES: PEERLA. No scleral icterus. Extraocular muscles intact.  HEENT: Head atraumatic, normocephalic. Oropharynx and nasopharynx clear.  NECK:  No JVD, supple  LUNGS: Decreased breath sounds bilaterally.mild wheezing bilaterally  No use of accessory muscles of respiration.  CARDIOVASCULAR: S1, S2 normal. No murmurs, rubs, or gallops.  ABDOMEN: Soft, NTND EXTREMITIES: No swelling or erythema.  Capillary refill < 3 seconds in all extremities. Pulses palpable distally. NEUROLOGIC: The patient is on BiPAP . No focal neurological deficit appreciated. Cranial nerves are intact.  SKIN: No obvious rash, lesion, or ulcer. Warm to touch Labs/imaging that I havepersonally reviewed  (right click and "Reselect all SmartList Selections" daily)     Labs   CBC: Recent Labs  Lab 09/03/23 0045  WBC 8.1  NEUTROABS 6.2  HGB 12.4  HCT 43.3  MCV  92.5  PLT 118*    Basic Metabolic Panel: Recent Labs  Lab 09/03/23 0045  NA 138  K 4.0  CL 88*  CO2 38*  GLUCOSE 122*  BUN 10  CREATININE 0.63  CALCIUM 9.1   GFR: CrCl cannot be calculated (Unknown ideal weight.). Recent Labs  Lab 09/03/23 0045  PROCALCITON <0.10  WBC 8.1    Liver Function Tests: Recent Labs   Lab 09/03/23 0045  AST 13*  ALT 6  ALKPHOS 74  BILITOT 0.6  PROT 7.6  ALBUMIN 4.0   No results for input(s): "LIPASE", "AMYLASE" in the last 168 hours. No results for input(s): "AMMONIA" in the last 168 hours.  ABG    Component Value Date/Time   PHART 7.36 09/03/2023 0203   PCO2ART 85 (HH) 09/03/2023 0203   PO2ART 61 (L) 09/03/2023 0203   HCO3 48.0 (H) 09/03/2023 0203   O2SAT 94.2 09/03/2023 0203     Coagulation Profile: No results for input(s): "INR", "PROTIME" in the last 168 hours.  Cardiac Enzymes: No results for input(s): "CKTOTAL", "CKMB", "CKMBINDEX", "TROPONINI" in the last 168 hours.  HbA1C: No results found for: "HGBA1C"  CBG: No results for input(s): "GLUCAP" in the last 168 hours.  Review of Systems:   Unable to be obtained secondary to the patient's altered mental status.    Past Medical History  She,  has a past medical history of Anxiety, COPD (chronic obstructive pulmonary disease) (HCC), Depression, and Thyroid disease.   Surgical History    Past Surgical History:  Procedure Laterality Date   EYE SURGERY     HIP SURGERY       Social History   reports that she has been smoking cigarettes. She has a 22.5 pack-year smoking history. She uses smokeless tobacco. She reports that she does not drink alcohol and does not use drugs.   Family History   Her family history includes Breast cancer in her mother; Heart attack in her brother and father.   Allergies Allergies  Allergen Reactions   Sulfa Antibiotics Other (See Comments) and Swelling    REACTION UNKNOWN  Per patient - both hands swell.  Hands draw up    REACTION UNKNOWN   Penicillins Other (See Comments)    Has patient had a PCN reaction causing immediate rash, facial/tongue/throat swelling, SOB or lightheadedness with hypotension: no Has patient had a PCN reaction causing severe rash involving mucus membranes or skin necrosis: no Has patient had a PCN reaction that required  hospitalization no  REACTION UNKNOWN Has patient had a PCN reaction occurring within the last 10 years: yes If all of the above answers are "NO", then may proceed with Cephalosporin use.       Home Medications  Prior to Admission medications   Medication Sig Start Date End Date Taking? Authorizing Provider  acetaminophen (TYLENOL) 650 MG CR tablet Take 1,300 mg by mouth 2 (two) times daily. 02/27/23   [provider]  albuterol (VENTOLIN HFA) 108 (90 Base) MCG/ACT inhaler Inhale 2 puffs into the lungs every 6 (six) hours as needed for wheezing or shortness of breath. 07/03/19   Concha Se, MD  azithromycin (ZITHROMAX) 250 MG tablet Take daily  for 3 days 03/16/23   Enedina Finner, MD  cholecalciferol (VITAMIN D3) 25 MCG (1000 UNIT) tablet Take 1,000 Units by mouth daily. 02/27/23   [provider]  COMBIGAN 0.2-0.5 % ophthalmic solution Place 1 drop into the left eye every 12 (twelve) hours. 02/20/23   [provider]  dextromethorphan-guaiFENesin (MUCINEX DM) 30-600 MG 12hr tablet Take 1 tablet by mouth 2 (two) times daily as needed for cough. 03/04/22   Arnetha Courser, MD  DULoxetine (CYMBALTA) 60 MG capsule Take 60 mg by mouth daily. 02/27/23   [provider]  FLONASE ALLERGY RELIEF 50 MCG/ACT nasal spray Place 2 sprays into both nostrils daily. 11/04/22   [provider]  gabapentin (NEURONTIN) 300 MG capsule Take 300 mg by mouth 2 (two) times daily. 02/27/23   [provider]  ipratropium-albuterol (DUONEB) 0.5-2.5 (3) MG/3ML SOLN Take 3 mLs by nebulization every 8 (eight) hours as needed. 03/04/22   Arnetha Courser, MD  levothyroxine (SYNTHROID) 137 MCG tablet Take 137 mcg by mouth daily.    [provider]  LORazepam (ATIVAN) 0.5 MG tablet Take 0.5 mg by mouth daily. 02/27/23   [provider]  mirtazapine (REMERON) 15 MG tablet Take 15 mg by mouth at bedtime. 02/18/22   [provider]  NARCAN 4 MG/0.1ML LIQD nasal spray kit  Place 0.4 mg into the nose once. 10/12/22   [provider]  nicotine (NICODERM CQ - DOSED IN MG/24 HOURS) 14 mg/24hr patch Place 1 patch (14 mg total) onto the skin daily. 03/17/23   Enedina Finner, MD  polyethylene glycol powder (GLYCOLAX/MIRALAX) 17 GM/SCOOP powder Take 17 g by mouth daily. 12/28/22   [provider]  rosuvastatin (CRESTOR) 20 MG tablet Take 20 mg by mouth at bedtime.    [provider]  senna (SENOKOT) 8.6 MG TABS tablet Take 2 tablets by mouth 2 (two) times daily. 02/27/23   [provider]  SUBOXONE 8-2 MG FILM Place 0.5 Film under the tongue 2 (two) times daily. 02/23/23   [provider]  TRELEGY ELLIPTA 100-62.5-25 MCG/ACT AEPB Inhale 1 puff into the lungs daily. 02/22/23   [provider]  Scheduled Meds:  arformoterol  15 mcg Nebulization BID   enoxaparin (LOVENOX) injection  40 mg Subcutaneous Q24H   ipratropium-albuterol  3 mL Nebulization Q6H   methylPREDNISolone (SOLU-MEDROL) injection  40 mg Intravenous Q12H   Followed by   Melene Muller ON 09/04/2023] predniSONE  40 mg Oral Q breakfast   Continuous Infusions:  azithromycin     cefTRIAXone (ROCEPHIN)  IV 1 g (09/03/23 0449)   PRN Meds:.albuterol, docusate sodium, polyethylene glycol   Active Hospital Problem list   See systems below  Assessment & Plan:  #Acute on Chronic Hypoxic and Hypercapnic Respiratory Failure #Acute Exacerbation of COPD  Background emphysema and COPD, former smoker on oxygen therapy at home. Now with hypoxic respiratory failure requiring BiPAP therapy.    -Supplemental O2 as needed to maintain O2 saturations 88 to 92% -BiPAP, wean as tolerated -High risk for intubation -Follow intermittent Chest X-ray & ABG as needed -Bronchodilators and Pulmicort nebs -IV Solu-Medrol 40 mg daily -Antibiotics   #Metabolic encephalopathy likely in the setting of hypercapnia and possible benzo overdose -CTH negative -BiPAP as above -Avoid sedatives as  able   #Sepsis due to Suspected CAP -F/u cultures, trend lactic/ PCT -Monitor WBC/ fever curve -IV antibiotics Ceftriaxone AND Azithromycin, ne leukocytosis, procal negative, de-escalate abx as needed -Gentle IVF hydration as needed -Strict I/O's  #Hypothyroidism -TSH, Free T4 -Continue home Synthroid  #Anxiety and depression -Hold home lorazepam -Hold mirtazapine, Cymbalta and Neurontin  Best practice:  Diet:  NPO Pain/Anxiety/Delirium protocol (if indicated): No VAP protocol (if indicated): Not indicated DVT prophylaxis: LMWH GI prophylaxis: N/A Glucose control:  SSI No Central venous access:  N/A  Arterial line:  N/A Foley:  N/A Mobility:  bed rest  PT consulted: N/A Last date of multidisciplinary goals of care discussion [uta] Code Status:  full code Disposition: stepdown   = Goals of Care = Code Status Order: FULL  Primary Emergency Contact: Stopka,Steven L Wishes to pursue full aggressive treatment and intervention options, including CPR and intubation, but goals of care will be addressed on going with family if that should become necessary.  Critical care time: 45 minutes        Webb Silversmith DNP, CCRN, FNP-C, AGACNP-BC Acute Care & Family Nurse Practitioner [facility] PCCM on call pager [phone]

## 2023-09-03 NOTE — Evaluation (Signed)
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 Admitted with acute on chronic hypoxic hypercapnic respiratory failure requiring BiPAP.   Clinical Impression  Upon PT arrival, pt without bipap due to mouth swab, but RN and pt agreeable to PT when bipap was donned. Pt a bit hard to understand due to bipap but endorsed being independent at home, lives with her son, 1 fall two weeks ago.  Supine <> sit with supervision and assistance with lines/leads. Good sitting balance observed. Pt eager to attempt standing, able to stand with 1 person handheld assist, CGA. She was able to take a few steps to the L towards the [facility] as well as forwards/backwards without LOB, but true ambulation limited due to bipap. Returned to supine with needs in reach.  Overall the patient demonstrated deficits (see "PT Problem List") that impede the patient's functional abilities, safety, and mobility and would benefit from skilled PT intervention.   Of note on bipap the pt's oxygen 88%-94% and pt denied any SOB/dizziness.        If plan is discharge home, recommend the following: A little help with walking and/or transfers;A little help with bathing/dressing/bathroom;Assistance with cooking/housework;Assist for transportation;Help with stairs or ramp for entrance   Can travel by private vehicle        Equipment Recommendations Rolling walker (2 wheels);BSC/3in1  Recommendations for Other Services       Functional Status Assessment Patient has had a recent decline in their functional status and demonstrates the ability to make significant  improvements in function in a reasonable and predictable amount of time.     Precautions / Restrictions Precautions Precautions: Fall Precaution Comments: bipap Restrictions Weight Bearing Restrictions: No      Mobility  Bed Mobility Overal bed mobility: Needs Assistance Bed Mobility: Supine to Sit, Sit to Supine     Supine to sit: Supervision, HOB elevated, Used rails Sit to supine: Supervision        Transfers Overall transfer level: Needs assistance Equipment used: 1 person hand held assist Transfers: Sit to/from Stand Sit to Stand: Contact guard assist                Ambulation/Gait Ambulation/Gait assistance: Contact guard assist Gait Distance (Feet): 2 Feet           General Gait Details: pt able to take two steps towards [facility] but true ambulation deferred due to bipap  Stairs            Wheelchair Mobility     Tilt Bed    Modified Rankin (Stroke Patients Only)       Balance Overall balance assessment: Needs assistance Sitting-balance support: Feet supported Sitting balance-Leahy Scale: Good     Standing balance support: Single extremity supported Standing balance-Leahy Scale: Good                               Pertinent Vitals/Pain Pain Assessment Pain Assessment: No/denies pain    Home Living Family/patient expects to be discharged to:: Private residence Living Arrangements: Children Available Help at Discharge: Family Type of Home: Apartment Home  Access: Stairs to enter   [employer] of Steps: 2   Home Layout: One level   Additional Comments: difficulty understanding pt due to bipap    Prior Function Prior Level of Function : Independent/Modified Independent             Mobility Comments: pt reported no AD at home, mentioned O2 at night and 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( difficulty hearing pt clearly)       Extremity/Trunk Assessment   Upper Extremity Assessment Upper Extremity  Assessment: Defer to OT evaluation    Lower Extremity Assessment Lower Extremity Assessment: Generalized weakness       Communication      Cognition Arousal: Alert Behavior During Therapy: WFL for tasks assessed/performed Overall Cognitive Status: Within Functional Limits for tasks assessed                                          General Comments      Exercises     Assessment/Plan    PT Assessment Patient needs continued PT services  PT Problem List Decreased strength;Decreased range of motion;Decreased activity tolerance;Decreased balance;Decreased mobility;Decreased knowledge of precautions;Cardiopulmonary status limiting activity       PT Treatment Interventions DME instruction;Balance training;Gait training;Neuromuscular re-education;Stair training;Functional mobility training;Patient/family education;Therapeutic activities;Therapeutic exercise    PT Goals (Current goals can be found in the Care Plan section)  Acute Rehab PT Goals Patient Stated Goal: to eat to get her strength back PT Goal Formulation: With patient Time For Goal Achievement: 09/17/23 Potential to Achieve Goals: Good    Frequency Min 1X/week     Co-evaluation               AM-PAC PT "6 Clicks" Mobility  Outcome Measure Help needed turning from your back to your side while in a flat bed without using bedrails?: None Help needed moving from lying on your back to sitting on the side of a flat bed without using bedrails?: None Help needed moving to and from a bed to a chair (including a wheelchair)?: None Help needed standing up from a chair using your arms (e.g., wheelchair or bedside chair)?: None Help needed to walk in hospital room?: A Little Help needed climbing 3-5 steps with a railing? : A Little 6 Click Score: 22    End of Session   Activity Tolerance: Patient tolerated treatment well Patient left: with call bell/phone within reach;with bed alarm set;in  bed Nurse Communication: Mobility status PT Visit Diagnosis: Other abnormalities of gait and mobility (R26.89);Difficulty in walking, not elsewhere classified (R26.2);Muscle weakness (generalized) (M62.81)    Time: 1340-1350 PT Time Calculation (min) (ACUTE ONLY): 10 min   Charges:   PT Evaluation $PT Eval Low Complexity: 1 Low   PT General Charges $$ ACUTE PT VISIT: 1 Visit        Olga Coaster PT, DPT 4:46 PM,09/03/23

## 2023-09-03 NOTE — Progress Notes (Signed)
Initial Nutrition Assessment  DOCUMENTATION CODES:   Not applicable  INTERVENTION:  Ensure BID when medically applicable. Advance diet as medically applicable   NUTRITION DIAGNOSIS:   Increased nutrient needs related to chronic illness as evidenced by estimated needs.    GOAL:   Patient will meet greater than or equal to 90% of their needs    MONITOR:   PO intake, Supplement acceptance, Labs, Weight trends  REASON FOR ASSESSMENT:   Consult Assessment of nutrition requirement/status  ASSESSMENT:  67 y.o. F, Pmhx; COPD, Anxiety, Depression, thyroid disease. Admitted for acute on chronic hypoxic and hypercapnic respiratory failure requiring initiation of BiPAP. Pt noted to have a non significant down trend in weight with 8% decline since April. Noted to have smoked and history of ETOH abuse 8 yr remission. RDN attempted to contact family and call pt room with no answer noted.  Will recommend ensure when medically applicable.  RD will continue to monitor for diet advancement.   Admit weight: 73.2 kg Current weight: 73.2 kg  Weight history; 09/03/23 73.2 kg  03/16/23 79.6 kg  03/03/22 79.4 kg    Average Meal Intake: NPO  Nutritionally Relevant Medications: Reviewed   Labs Reviewed   NUTRITION - FOCUSED PHYSICAL EXAM:    Diet Order:   Diet Order             Diet NPO time specified  Diet effective now                   EDUCATION NEEDS:      Skin:  Skin Integrity Issues:: Other (Comment) Other: Non pressure breast  Last BM:     Height:   Ht Readings from Last 1 Encounters:  03/14/23 5\' 4"  (1.626 m)    Weight:   Wt Readings from Last 1 Encounters:  09/03/23 73.2 kg    Ideal Body Weight:     BMI:  Body mass index is 27.7 kg/m.  Estimated Nutritional Needs:   Kcal:  1900-2200 kcal  Protein:  95-110 g  Fluid:  73ml/kcal    Jamelle Haring RDN, LDN Clinical Dietitian  RDN pager # available on Amion

## 2023-09-03 NOTE — ED Notes (Signed)
RT is at the bedside.

## 2023-09-03 NOTE — ED Provider Notes (Signed)
[facility] Provider Note    Event Date/Time   First MD Initiated Contact with Patient 09/03/23 0030     (approximate)   History   Dizziness (Per ems they was called out d/t dizziness, weakness, shaky when she stands for several weeks.EMS sts when [employer] the pt is usually on 2 liter of O2 but was in the 70's with no oxygen on her. After pt received a albuterol tx d/t wheezing in lung fields, EMS sts her sat increased to 95%-98. Pt sts she has chest pain and headache. )   HPI  Caroline Coleman is a 67 y.o. female    with history of COPD on 3 L nasal cannula, hypothyroidism, depression who presents to the emergency department complaints of 5 months of intermittent dizziness, generalized weakness and shaking.  Patient states she got up tonight and was shaking so hard that she needed help to get to the bathroom.  Her son called 911 because he was concerned she was going to fall.  When EMS arrived patient was hypoxic but not wearing her oxygen.  She was wheezing and EMS gave her albuterol.  She denies any chest pain to me but complained of chest pain to the triage nurse.  Also complaining of shortness of breath.  No fever or cough.  Complaining of headache.  Did have a fall 2 days ago.  Not on blood thinners.  No new numbness, tingling or focal weakness.  Patient is difficult to understand at times and is mumbling and loses her train of thought.  She denies any drug or alcohol use per chart lists history of substance abuse and alcohol abuse.  Patient is on Suboxone, gabapentin and Ativan per controlled substance reporting system.                        History provided by patient, EMS.    Past Medical History:  Diagnosis Date   Anxiety    COPD (chronic obstructive pulmonary disease) (HCC)    Depression    Thyroid disease     Past Surgical History:  Procedure Laterality Date   EYE SURGERY     HIP SURGERY      MEDICATIONS:  Prior to Admission  medications   Medication Sig Start Date End Date Taking? Authorizing Provider  acetaminophen (TYLENOL) 650 MG CR tablet Take 1,300 mg by mouth 2 (two) times daily. 02/27/23   [provider]  albuterol (VENTOLIN HFA) 108 (90 Base) MCG/ACT inhaler Inhale 2 puffs into the lungs every 6 (six) hours as needed for wheezing or shortness of breath. 07/03/19   Concha Se, MD  azithromycin (ZITHROMAX) 250 MG tablet Take daily  for 3 days 03/16/23   Enedina Finner, MD  cholecalciferol (VITAMIN D3) 25 MCG (1000 UNIT) tablet Take 1,000 Units by mouth daily. 02/27/23   [provider]  COMBIGAN 0.2-0.5 % ophthalmic solution Place 1 drop into the left eye every 12 (twelve) hours. 02/20/23   [provider]  dextromethorphan-guaiFENesin (MUCINEX DM) 30-600 MG 12hr tablet Take 1 tablet by mouth 2 (two) times daily as needed for cough. 03/04/22   Arnetha Courser, MD  DULoxetine (CYMBALTA) 60 MG capsule Take 60 mg by mouth daily. 02/27/23   [provider]  FLONASE ALLERGY RELIEF 50 MCG/ACT nasal spray Place 2 sprays into both nostrils daily. 11/04/22   [provider]  gabapentin (NEURONTIN) 300 MG capsule Take 300 mg by mouth 2 (two) times  daily. 02/27/23   [provider]  ipratropium-albuterol (DUONEB) 0.5-2.5 (3) MG/3ML SOLN Take 3 mLs by nebulization every 8 (eight) hours as needed. 03/04/22   Arnetha Courser, MD  levothyroxine (SYNTHROID) 137 MCG tablet Take 137 mcg by mouth daily.    [provider]  LORazepam (ATIVAN) 0.5 MG tablet Take 0.5 mg by mouth daily. 02/27/23   [provider]  mirtazapine (REMERON) 15 MG tablet Take 15 mg by mouth at bedtime. 02/18/22   [provider]  NARCAN 4 MG/0.1ML LIQD nasal spray kit Place 0.4 mg into the nose once. 10/12/22   [provider]  nicotine (NICODERM CQ - DOSED IN MG/24 HOURS) 14 mg/24hr patch Place 1 patch (14 mg total) onto the skin daily. 03/17/23   Enedina Finner, MD  polyethylene glycol powder  (GLYCOLAX/MIRALAX) 17 GM/SCOOP powder Take 17 g by mouth daily. 12/28/22   [provider]  rosuvastatin (CRESTOR) 20 MG tablet Take 20 mg by mouth at bedtime.    [provider]  senna (SENOKOT) 8.6 MG TABS tablet Take 2 tablets by mouth 2 (two) times daily. 02/27/23   [provider]  SUBOXONE 8-2 MG FILM Place 0.5 Film under the tongue 2 (two) times daily. 02/23/23   [provider]  TRELEGY ELLIPTA 100-62.5-25 MCG/ACT AEPB Inhale 1 puff into the lungs daily. 02/22/23   [provider]    Physical Exam   Triage Vital Signs: ED Triage Vitals [09/03/23 0038]  Encounter Vitals Group     BP 115/72     Systolic BP Percentile      Diastolic BP Percentile      Pulse Rate 85     Resp 14     Temp 98.6 F (37 C)     Temp src      SpO2 90 %     Weight      Height      Head Circumference      Peak Flow      Pain Score      Pain Loc      Pain Education      Exclude from Growth Chart     Most recent vital signs: Vitals:   09/03/23 0400 09/03/23 0430  BP: (!) 97/59 106/60  Pulse: 73 74  Resp:    Temp:    SpO2: 93% 92%    CONSTITUTIONAL: Alert, oriented to person and place but not year.  Has a hard time answering questions and will get off topic easily.  Mumbling intermittently and hard to understand. HEAD: Normocephalic, atraumatic EYES: Conjunctivae clear, pupils appear equal, sclera nonicteric ENT: normal nose; moist mucous membranes NECK: Supple, normal ROM, no midline step-off or deformity CARD: RRR; S1 and S2 appreciated RESP: Normal chest excursion without splinting or tachypnea; patient has inspiratory and expiratory wheezing, diminished aeration, no rhonchi or rales ABD/GI: Non-distended; soft, non-tender, no rebound, no guarding, no peritoneal signs BACK: The back appears normal EXT: Normal ROM in all joints; no deformity noted, no edema, no calf tenderness or calf swelling SKIN: Normal color for age and race; warm; no rash on  exposed skin NEURO: Moves all extremities equally, mumbling speech but no dysarthria, no drift, reports normal sensation, cranial nerves II through XII intact, has a hard time following commands, no tremor PSYCH: The patient's mood and manner are appropriate.   ED Results / Procedures / Treatments   LABS: (all labs ordered are listed, but only abnormal results are displayed) Labs Reviewed  CBC  WITH DIFFERENTIAL/PLATELET - Abnormal; Notable for the following components:      Result Value   MCHC 28.6 (*)    Platelets 118 (*)    All other components within normal limits  COMPREHENSIVE METABOLIC PANEL - Abnormal; Notable for the following components:   Chloride 88 (*)    CO2 38 (*)    Glucose, Bld 122 (*)    AST 13 (*)    All other components within normal limits  URINALYSIS, ROUTINE W REFLEX MICROSCOPIC - Abnormal; Notable for the following components:   Color, Urine YELLOW (*)    APPearance CLEAR (*)    Protein, ur 30 (*)    Bacteria, UA RARE (*)    All other components within normal limits  URINE DRUG SCREEN, QUALITATIVE ([facility] ONLY) - Abnormal; Notable for the following components:   Benzodiazepine, Ur Scrn POSITIVE (*)    All other components within normal limits  BLOOD GAS, VENOUS - Abnormal; Notable for the following components:   pCO2, Ven 103 (*)    pO2, Ven <31 (*)    Bicarbonate 51.9 (*)    Acid-Base Excess 20.0 (*)    All other components within normal limits  BLOOD GAS, ARTERIAL - Abnormal; Notable for the following components:   pCO2 arterial 85 (*)    pO2, Arterial 61 (*)    Bicarbonate 48.0 (*)    Acid-Base Excess 17.9 (*)    All other components within normal limits  ETHANOL  TSH  PROCALCITONIN  BRAIN NATRIURETIC PEPTIDE  LEGIONELLA PNEUMOPHILA SEROGP 1 UR AG  STREP PNEUMONIAE URINARY ANTIGEN  CBG MONITORING, ED  TROPONIN I (HIGH SENSITIVITY)  TROPONIN I (HIGH SENSITIVITY)     EKG:  EKG Interpretation Date/Time:  Sunday September 03 2023 00:34:36  EDT Ventricular Rate:  87 PR Interval:  96 QRS Duration:  78 QT Interval:  342 QTC Calculation: 412 R Axis:   43  Text Interpretation: Sinus or ectopic atrial rhythm Atrial premature complexes Short PR interval Confirmed by Rochele Raring [phone]) on 09/03/2023 12:59:58 AM         RADIOLOGY: My personal review and interpretation of imaging: Chest x-ray clear.  CT head and cervical spine show no traumatic injury.  I have personally reviewed all radiology reports.   DG Chest Portable 1 View  Result Date: 09/03/2023 CLINICAL DATA:  Chest pain, dyspnea EXAM: PORTABLE CHEST 1 VIEW COMPARISON:  None Available. FINDINGS: Lungs are clear. No pneumothorax or pleural effusion. Cardiac size within normal limits. Healed left rib fractures. No acute bone abnormality. IMPRESSION: No active disease. Electronically Signed   By: Helyn Numbers M.D.   On: 09/03/2023 01:49   CT HEAD WO CONTRAST ( )  Result Date: 09/03/2023 CLINICAL DATA:  Mental status change, unknown cause; Neck trauma (Age >= 65y) EXAM: CT HEAD WITHOUT CONTRAST CT CERVICAL SPINE WITHOUT CONTRAST TECHNIQUE: Multidetector CT imaging of the head and cervical spine was performed following the standard protocol without intravenous contrast. Multiplanar CT image reconstructions of the cervical spine were also generated. RADIATION DOSE REDUCTION: This exam was performed according to the departmental dose-optimization program which includes automated exposure control, adjustment of the mA and/or kV according to patient size and/or use of iterative reconstruction technique. COMPARISON:  None Available. FINDINGS: CT HEAD FINDINGS Brain: No evidence of large-territorial acute infarction. No parenchymal hemorrhage. No mass lesion. No extra-axial collection. No mass effect or midline shift. No hydrocephalus. Basilar cisterns are patent. Vascular: Atherosclerotic calcifications are present within the cavernous internal carotid arteries. No hyperdense  vessel. Skull: No acute fracture or focal lesion. Sinuses/Orbits: Paranasal sinuses and mastoid air cells are clear. Bilateral lens replacement. Otherwise the orbits are unremarkable. Other: None. CT CERVICAL SPINE FINDINGS Alignment: Straightening of the normal cervical lordosis likely due to positioning and degenerative changes. Grade 1 anterolisthesis C2 on C3 and C3 on C4. Skull base and vertebrae: Multilevel mild-to-moderate degenerative changes of the spine. No associated severe osseous neural foraminal or central canal stenosis. No acute fracture. No aggressive appearing focal osseous lesion or focal pathologic process. Soft tissues and spinal canal: No prevertebral fluid or swelling. No visible canal hematoma. Upper chest: Biapical paraseptal emphysematous changes. Other: None. IMPRESSION: 1. No acute intracranial abnormality. 2. No acute displaced fracture or traumatic listhesis of the cervical spine. 3.  Emphysema (ICD10-J43.9). Electronically Signed   By: Tish Frederickson M.D.   On: 09/03/2023 01:40   CT Cervical Spine Wo Contrast  Result Date: 09/03/2023 CLINICAL DATA:  Mental status change, unknown cause; Neck trauma (Age >= 65y) EXAM: CT HEAD WITHOUT CONTRAST CT CERVICAL SPINE WITHOUT CONTRAST TECHNIQUE: Multidetector CT imaging of the head and cervical spine was performed following the standard protocol without intravenous contrast. Multiplanar CT image reconstructions of the cervical spine were also generated. RADIATION DOSE REDUCTION: This exam was performed according to the departmental dose-optimization program which includes automated exposure control, adjustment of the mA and/or kV according to patient size and/or use of iterative reconstruction technique. COMPARISON:  None Available. FINDINGS: CT HEAD FINDINGS Brain: No evidence of large-territorial acute infarction. No parenchymal hemorrhage. No mass lesion. No extra-axial collection. No mass effect or midline shift. No hydrocephalus.  Basilar cisterns are patent. Vascular: Atherosclerotic calcifications are present within the cavernous internal carotid arteries. No hyperdense vessel. Skull: No acute fracture or focal lesion. Sinuses/Orbits: Paranasal sinuses and mastoid air cells are clear. Bilateral lens replacement. Otherwise the orbits are unremarkable. Other: None. CT CERVICAL SPINE FINDINGS Alignment: Straightening of the normal cervical lordosis likely due to positioning and degenerative changes. Grade 1 anterolisthesis C2 on C3 and C3 on C4. Skull base and vertebrae: Multilevel mild-to-moderate degenerative changes of the spine. No associated severe osseous neural foraminal or central canal stenosis. No acute fracture. No aggressive appearing focal osseous lesion or focal pathologic process. Soft tissues and spinal canal: No prevertebral fluid or swelling. No visible canal hematoma. Upper chest: Biapical paraseptal emphysematous changes. Other: None. IMPRESSION: 1. No acute intracranial abnormality. 2. No acute displaced fracture or traumatic listhesis of the cervical spine. 3.  Emphysema (ICD10-J43.9). Electronically Signed   By: Tish Frederickson M.D.   On: 09/03/2023 01:40     PROCEDURES:  Critical Care performed: Yes, see critical care procedure note(s)   CRITICAL CARE Performed by: Baxter Hire Rhylan Kagel   Total critical care time: 40 minutes  Critical care time was exclusive of separately billable procedures and treating other patients.  Critical care was necessary to treat or prevent imminent or life-threatening deterioration.  Critical care was time spent personally by me on the following activities: development of treatment plan with patient and/or surrogate as well as nursing, discussions with consultants, evaluation of patient's response to treatment, examination of patient, obtaining history from patient or surrogate, ordering and performing treatments and interventions, ordering and review of laboratory studies, ordering  and review of radiographic studies, pulse oximetry and re-evaluation of patient's condition.   Marland Kitchen1-3 Lead EKG Interpretation  Performed by: Dody Smartt, Layla Maw, DO Authorized by: [employer], Layla Maw, DO     Interpretation: normal     ECG rate:  74   ECG rate assessment: normal     Rhythm: sinus rhythm     Ectopy: none     Conduction: normal       IMPRESSION / MDM / ASSESSMENT AND PLAN / ED COURSE  I reviewed the triage vital signs and the nursing notes.    Patient here with complaints of generalized weakness, dizziness, shakiness that has been ongoing intermittently for 5 months.  Also short of breath, wheezing and appears to be having a COPD exacerbation.  The patient is on the cardiac monitor to evaluate for evidence of arrhythmia and/or significant heart rate changes.   DIFFERENTIAL DIAGNOSIS (includes but not limited to):   CVA, intoxication, anemia, electrolyte derangement, thyroid dysfunction, polypharmacy, hypercarbia, COPD exacerbation, UTI   Patient's presentation is most consistent with acute presentation with potential threat to life or bodily function.   PLAN: Will obtain labs, urine, chest x-ray, CT head and cervical spine given recent fall and altered mental status.  Will give breathing treatments, steroids.   MEDICATIONS GIVEN IN ED: Medications  docusate sodium (COLACE) capsule 100 mg (has no administration in time range)  polyethylene glycol (MIRALAX / GLYCOLAX) packet 17 g (has no administration in time range)  enoxaparin (LOVENOX) injection 40 mg (has no administration in time range)  cefTRIAXone (ROCEPHIN) 1 g in sodium chloride 0.9 % 100 mL IVPB (1 g Intravenous New Bag/Given 09/03/23 0449)  methylPREDNISolone sodium succinate (SOLU-MEDROL) 40 mg/mL injection 40 mg (has no administration in time range)    Followed by  predniSONE (DELTASONE) tablet 40 mg (has no administration in time range)  ipratropium-albuterol (DUONEB) 0.5-2.5 (3) MG/3ML nebulizer solution 3  mL (3 mLs Nebulization Given 09/03/23 0448)  albuterol (PROVENTIL) (2.5 MG/3ML) 0.083% nebulizer solution 2.5 mg (has no administration in time range)  arformoterol (BROVANA) nebulizer solution 15 mcg (has no administration in time range)  azithromycin (ZITHROMAX) 500 mg in sodium chloride 0.9 % 250 mL IVPB (has no administration in time range)  ipratropium-albuterol (DUONEB) 0.5-2.5 (3) MG/3ML nebulizer solution 3 mL (3 mLs Nebulization Given 09/03/23 0117)  methylPREDNISolone sodium succinate (SOLU-MEDROL) 125 mg/2 mL injection 125 mg (125 mg Intravenous Given 09/03/23 0117)  naloxone [facility]) injection 0.4 mg (0.4 mg Intravenous Given 09/03/23 0159)  naloxone (NARCAN) injection 1 mg (1 mg Intravenous Given 09/03/23 0255)     ED COURSE: Patient's gas shows hypercapnia with appropriate metabolic compensation.  Normal pH.  No leukocytosis.  Normal hemoglobin.  Normal electrolytes, glucose.  Troponin negative.  Urine does not appear grossly infected other than some white blood cells but rare bacteria.  Drug screen positive for benzodiazepines which she is prescribed.  Ethanol level negative.  TSH normal.  CT head and cervical spine reviewed and interpreted by myself and the radiologist and show no acute injury.  Chest x-ray clear.  Due to patient's somnolence he was given 2 rounds of Narcan without any improvement.  She is arousable at times to sternal rub and voice intermittently but continues to be very drowsy and I am worried that her hypercapnia is going to worsen likely due to intoxication, polypharmacy and could lead to the need for intubation.  She is currently on BiPAP.  Will discuss with critical care.   CONSULTS: Discussed with Webb Silversmith with critical care who agrees to admit patient to the ICU for close monitoring and agrees patient may need intubation at some point if mental status not improving on BiPAP.   OUTSIDE RECORDS REVIEWED: Reviewed last admission in April 2024 for  sepsis from  right lower lobe pneumonia.       FINAL CLINICAL IMPRESSION(S) / ED DIAGNOSES   Final diagnoses:  COPD with acute exacerbation (HCC)  Altered mental status, unspecified altered mental status type  Polypharmacy     Rx / DC Orders   ED Discharge Orders     None        Note:  This document was prepared using Dragon voice recognition software and may include unintentional dictation errors.   Elicia Lui, Layla Maw, DO 09/03/23 [phone]

## 2023-09-03 NOTE — Progress Notes (Signed)
OT Cancellation Note  Patient Details Name: Caroline Coleman MRN: [id number] DOB: 07/15/1956   Cancelled Treatment:    Reason Eval/Treat Not Completed: Other (comment). OT orders received, pt to be prioritized for OT eval Monday, 10/07. Pt on Bi-PAP per flowsheet, will attempt when able.  Kaile Bixler L. Emmogene Simson, OTR/L  09/03/23, 4:19 PM

## 2023-09-03 NOTE — ED Notes (Signed)
Advised nurse that patient has ready bed 

## 2023-09-03 NOTE — Progress Notes (Signed)
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 Pharmacy is asked to follow and replace electrolytes while in CCU   Goal of Therapy:  Electrolytes WNL  Plan:  ---no electrolyte replacement warranted for today ---recheck electrolytes in am  Lowella Bandy ,PharmD Clinical Pharmacist 09/03/2023 7:45 AM

## 2023-09-03 NOTE — ED Notes (Signed)
Report given to Hannah,RN

## 2023-09-04 DIAGNOSIS — J9601 Acute respiratory failure with hypoxia: Secondary | ICD-10-CM | POA: Diagnosis not present

## 2023-09-04 DIAGNOSIS — J9612 Chronic respiratory failure with hypercapnia: Secondary | ICD-10-CM | POA: Diagnosis not present

## 2023-09-04 LAB — BASIC METABOLIC PANEL
Anion gap: 13 (ref 5–15)
BUN: 17 mg/dL (ref 8–23)
CO2: 34 mmol/L — ABNORMAL HIGH (ref 22–32)
Calcium: 9.1 mg/dL (ref 8.9–10.3)
Chloride: 92 mmol/L — ABNORMAL LOW (ref 98–111)
Creatinine, Ser: 0.61 mg/dL (ref 0.44–1.00)
GFR, Estimated: >60 mL/min (ref >60)
Glucose, Bld: 141 mg/dL — ABNORMAL HIGH (ref 70–99)
Potassium: 3.8 mmol/L (ref 3.5–5.1)
Sodium: 139 mmol/L (ref 135–145)

## 2023-09-04 LAB — CBC
HCT: 36.7 % (ref 36.0–46.0)
Hemoglobin: 11.3 g/dL — ABNORMAL LOW (ref 12.0–15.0)
MCH: 26.7 pg (ref 26.0–34.0)
MCHC: 30.8 g/dL (ref 30.0–36.0)
MCV: 86.6 fL (ref 80.0–100.0)
Platelets: 125 10*3/uL — ABNORMAL LOW (ref 150–400)
RBC: 4.24 MIL/uL (ref 3.87–5.11)
RDW: 13.7 % (ref 11.5–15.5)
WBC: 7.1 10*3/uL (ref 4.0–10.5)
nRBC: 0 % (ref 0.0–0.2)

## 2023-09-04 LAB — MAGNESIUM: Magnesium: 2.3 mg/dL (ref 1.7–2.4)

## 2023-09-04 LAB — PHOSPHORUS: Phosphorus: 4 mg/dL (ref 2.5–4.6)

## 2023-09-04 LAB — GLUCOSE, CAPILLARY: Glucose-Capillary: 169 mg/dL — ABNORMAL HIGH (ref 70–99)

## 2023-09-04 MED ORDER — LACTATED RINGERS IV BOLUS
500.0000 mL | Freq: Once | INTRAVENOUS | Status: AC
  Administered 2023-09-04: 500 mL via INTRAVENOUS

## 2023-09-04 MED ORDER — ORAL CARE MOUTH RINSE: 15.0000 mL | OROMUCOSAL | Status: DC | PRN

## 2023-09-04 MED ORDER — PAROXETINE HCL 20 MG PO TABS
20.0000 mg | ORAL_TABLET | Freq: Every day | ORAL | Status: DC
  Administered 2023-09-04 – 2023-09-06 (×3): 20 mg via ORAL
  Filled 2023-09-04 (×3): qty 1

## 2023-09-04 MED ORDER — BUPRENORPHINE HCL-NALOXONE HCL 8-2 MG SL SUBL
0.5000 | SUBLINGUAL_TABLET | Freq: Every day | SUBLINGUAL | Status: DC
  Administered 2023-09-04 – 2023-09-06 (×3): 0.5 via SUBLINGUAL
  Filled 2023-09-04 (×3): qty 1

## 2023-09-04 MED ORDER — VITAMIN C 500 MG PO TABS
500.0000 mg | ORAL_TABLET | Freq: Two times a day (BID) | ORAL | Status: DC
  Administered 2023-09-04 – 2023-09-06 (×4): 500 mg via ORAL
  Filled 2023-09-04 (×4): qty 1

## 2023-09-04 MED ORDER — ENSURE ENLIVE PO LIQD
237.0000 mL | Freq: Two times a day (BID) | ORAL | Status: DC
  Administered 2023-09-04 – 2023-09-06 (×4): 237 mL via ORAL

## 2023-09-04 MED ORDER — ADULT MULTIVITAMIN W/MINERALS CH
1.0000 | ORAL_TABLET | Freq: Every day | ORAL | Status: DC
  Administered 2023-09-05 – 2023-09-06 (×2): 1 via ORAL
  Filled 2023-09-04 (×2): qty 1

## 2023-09-04 MED ORDER — MIDODRINE HCL 5 MG PO TABS
10.0000 mg | ORAL_TABLET | Freq: Three times a day (TID) | ORAL | Status: DC
  Administered 2023-09-04 – 2023-09-06 (×5): 10 mg via ORAL
  Filled 2023-09-04 (×5): qty 2

## 2023-09-04 NOTE — Progress Notes (Signed)
Nutrition Follow-up  DOCUMENTATION CODES:   Not applicable  INTERVENTION:   Ensure Enlive po BID, each supplement provides 350 kcal and 20 grams of protein.  MVI po daily  Vitamin C 500mg  po BID  Daily weights   NUTRITION DIAGNOSIS:   Increased nutrient needs related to chronic illness as evidenced by estimated needs.  GOAL:   Patient will meet greater than or equal to 90% of their needs  MONITOR:   PO intake, Supplement acceptance, Labs, Weight trends  REASON FOR ASSESSMENT:   Consult Assessment of nutrition requirement/status  ASSESSMENT:   67 y/o female with h/o COPD, depression, anxiety, SI, etoh abuse, thyroid disease and HLD who is admitted with COPD exacerbation, CAP and sepsis.  Met with pt in room today. Pt reports fair appetite and oral intake at baseline. Pt reports that she ate a pancake for breakfast this morning. RD discussed with pt the importance of adequate nutrition needed to preserve lean muscle and to support wound healing. Pt is willing to drink strawberry Ensure. RD will add supplements and vitamins to help pt meet her estimated needs. No new weight since admission.   Medications reviewed and include: suboxone, lovenox, synthroid, remeron, MVI, paxil, azithromycin, ceftriaxone   Labs reviewed: K 3.8 wnl, P 4.0 wnl, Mg 2.3 wnl  NUTRITION - FOCUSED PHYSICAL EXAM:  Flowsheet Row Most Recent Value  Orbital Region No depletion  Upper Arm Region No depletion  Thoracic and Lumbar Region No depletion  Buccal Region No depletion  Temple Region No depletion  Clavicle Bone Region No depletion  Clavicle and Acromion Bone Region No depletion  Scapular Bone Region No depletion  Dorsal Hand No depletion  Patellar Region No depletion  Anterior Thigh Region No depletion  Posterior Calf Region No depletion  Edema (RD Assessment) None  Hair Reviewed  Eyes Reviewed  Mouth Reviewed  Skin Reviewed  Nails Reviewed   Diet Order:   Diet Order              Diet regular Room service appropriate? Yes; Fluid consistency: Thin  Diet effective now                  EDUCATION NEEDS:   Education needs have been addressed  Skin:  Skin Assessment: Reviewed RN Assessment (NPI breast) Skin Integrity Issues:: Other (Comment) Other: Non pressure breast  Last BM:  10/6- type 4  Height:   Ht Readings from Last 1 Encounters:  03/14/23 5\' 4"  (1.626 m)    Weight:   Wt Readings from Last 1 Encounters:  09/03/23 73.2 kg   BMI:  Body mass index is 27.7 kg/m.  Estimated Nutritional Needs:   Kcal:  1700-1900kcal/day  Protein:  85-95g/day  Fluid:  1.4-1.6L/day  Betsey Holiday MS, RD, LDN Please refer to [facility] for RD and/or RD on-call/weekend/after hours pager

## 2023-09-04 NOTE — Plan of Care (Signed)

## 2023-09-04 NOTE — Progress Notes (Signed)
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  Per ED reports, EMS was called by patient's son due to concern that she was going to fall.  On EMS arrival, patient was hypoxic with sats in the 70s with no oxygen.  Patient received albuterol treatment due to wheezing with increased in sats to 95% patient was complaining of shortness of breath, intermittent chest pain and headache.   ED Course: Initial vital signs showed HR of 85 beats/minute, BP 115/72 mm Hg, the RR 14 breaths/minute, and the oxygen saturation 90% on 3L and a temperature of 98.37F (37C).  Pertinent Labs/Diagnostics Findings: Glucose:122, CO2:38 Plts:118 PCT: negative <0.10  ABG: pO2 61; pCO2 85; pH 7.36;  HCO3 48 %O2 Sat 94.2.  CXR> CTH> negative  Due to patient's somnolence she received 2 rounds of Narcan without any improvement.  She was only arousable to vigorous sternal rub and to voice intermittently but remained drowsy.  Blood gas shows hypercapnia therefore she was placed on BiPAP.  Due to high risk for decompensation, PCCM consulted for admission.  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  10/6: Noncontrast CT  head> IMPRESSION: 1. No acute intracranial abnormality. 2. No acute displaced fracture or traumatic listhesis of the cervical spine. 3.  Emphysema (ICD10-J43.9).  Interim History / Subjective:    Alert and awake Off biPAP Resp failure improving  Micro Data:  10/6: SARS-CoV-2 PCR>  10/6: Influenza PCR>  10/6: Blood culture x2> 10/6: MRSA PCR>>  10/6: Strep pneumo urinary antigen> 10/6: Legionella urinary antigen>  Antimicrobials:  10/6:Azithromycin> 10/6: Ceftriaxone>  OBJECTIVE  Blood pressure 91/61, pulse 65, temperature 98.6 F (37 C), temperature source Oral, resp. rate 16, weight 73.2 kg, SpO2 92%.    FiO2 (%):  [35 %-50 %] 45 %   Intake/Output Summary (Last 24 hours) at 09/04/2023 0735 Last data filed at 09/03/2023 1600 Gross per 24 hour  Intake 338.34 ml  Output 60 ml  Net 278.34 ml   Filed Weights   09/03/23 0818  Weight: 73.2 kg       Review of Systems: Gen:  Denies  fever, sweats, chills weight loss  Cardiac:  No dizziness, chest pain or heaviness, chest tightness,edema, No JVD Resp:   No cough, -sputum production, +shortness of breath,+wheezing, -hemoptysis,  Other:  All other systems negative   Physical Examination:   General Appearance: minimal  distress  EYES PERRLA, EOM intact.   NECK Supple, No JVD Pulmonary: normal breath sounds, + wheezing.  CardiovascularNormal S1,S2.  No m/r/g.   Abdomen: Benign, Soft, non-tender. Neurology UE/LE 5/5 strength, no focal deficits Ext pulses intact, cap refill intact ALL OTHER ROS ARE NEGATIVE    Labs   CBC: Recent  Labs  Lab 09/03/23 0045 09/04/23 0340  WBC 8.1 7.1  NEUTROABS 6.2  --   HGB 12.4 11.3*  HCT 43.3 36.7  MCV 92.5 86.6  PLT 118* 125*    Basic Metabolic Panel: Recent Labs  Lab 09/03/23 0045 09/04/23 0340  NA 138 139  K 4.0 3.8  CL 88* 92*  CO2 38* 34*  GLUCOSE 122* 141*  BUN 10 17  CREATININE 0.63 0.61  CALCIUM 9.1 9.1  MG  --  2.3  PHOS  --  4.0   GFR: Estimated  Creatinine Clearance: 67.8 mL/min (by C-G formula based on SCr of 0.61 mg/dL). Recent Labs  Lab 09/03/23 0045 09/04/23 0340  PROCALCITON <0.10  --   WBC 8.1 7.1    Liver Function Tests: Recent Labs  Lab 09/03/23 0045  AST 13*  ALT 6  ALKPHOS 74  BILITOT 0.6  PROT 7.6  ALBUMIN 4.0   No results for input(s): "LIPASE", "AMYLASE" in the last 168 hours. No results for input(s): "AMMONIA" in the last 168 hours.  ABG    Component Value Date/Time   PHART 7.36 09/03/2023 0203   PCO2ART 85 (HH) 09/03/2023 0203   PO2ART 61 (L) 09/03/2023 0203   HCO3 48.0 (H) 09/03/2023 0203   O2SAT 94.2 09/03/2023 0203     Active Hospital Problem list   See systems below  Assessment & Plan:  #Acute on Chronic Hypoxic and Hypercapnic Respiratory Failure #Acute Exacerbation of COPD   Severe ACUTE Hypoxic and Hypercapnic Respiratory Failure biPAP as needed Fio2 as needed - Intermittent chest x-ray & ABG PRN - Ensure adequate pulmonary hygiene   SEVERE COPD EXACERBATION -continue NEB THERAPY as prescribed -morphine as needed -wean fio2 as needed and tolerated -Bronchodilators and Pulmicort nebs -IV Solu-Medrol 40 mg daily -Antibiotics   Metabolic encephalopathy-resolving    ID Sepsis due to Suspected CAP -F/u cultures, trend lactic/ PCT -Monitor WBC/ fever curve -IV antibiotics Ceftriaxone AND Azithromycin, ne leukocytosis, procal negative, de-escalate abx as needed  Hypothyroidism -TSH, Free T4 -Continue home Synthroid  Anxiety and depression Re-assess meds   ENDO - ICU hypoglycemic\Hyperglycemia protocol -check FSBS per protocol   GI GI PROPHYLAXIS as indicated  NUTRITIONAL STATUS DIET-->as tolerated Constipation protocol as indicated   ELECTROLYTES -follow labs as needed -replace as needed -pharmacy consultation and following   Best practice:  Diet:  NPO Pain/Anxiety/Delirium protocol (if indicated): No VAP protocol (if indicated): Not indicated DVT  prophylaxis: LMWH GI prophylaxis: N/A Glucose control:  SSI No Central venous access:  N/A Arterial line:  N/A Foley:  N/A Mobility:  bed rest  PT consulted: N/A Last date of multidisciplinary goals of care discussion [uta] Code Status:  full code Disposition: stepdown     DVT/GI PRX  assessed I Assessed the need for Labs I Assessed the need for Foley I Assessed the need for Central Venous Line Family Discussion when available I Assessed the need for Mobilization I made an Assessment of medications to be adjusted accordingly Safety Risk assessment completed  CASE DISCUSSED IN MULTIDISCIPLINARY ROUNDS WITH ICU TEAM     Time devoted to patient care services described in this note is 37 mins   Ivy Puryear Santiago Glad, M.D.  [facility]  Medical Director [facility] Medical Director [facility] Cardio-Pulmonary Department

## 2023-09-04 NOTE — Progress Notes (Signed)
PHARMACY CONSULT NOTE  Pharmacy Consult for Electrolyte Monitoring and Replacement   Recent Labs: Potassium (mmol/L)  Date Value  09/04/2023 3.8  10/01/2014 3.7   Magnesium (mg/dL)  Date Value  94/85/4627 2.3   Calcium (mg/dL)  Date Value  03/50/0938 9.1   Calcium, Total (mg/dL)  Date Value  18/29/9371 8.6   Albumin (g/dL)  Date Value  69/67/8938 4.0  01/16/2013 3.7   Phosphorus (mg/dL)  Date Value  09/13/5101 4.0   Sodium (mmol/L)  Date Value  09/04/2023 139  10/01/2014 139     Assessment: 67 y.o with female with significant PMH of COPD on 3 L O2 at night, hypothyroidism, anxiety and depression, tobacco abuse, and 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 Pharmacy is asked to follow and replace electrolytes while in CCU  Goal of Therapy:  Electrolytes WNL  Plan:  ---no electrolyte replacement warranted for today ---recheck electrolytes in am  Elliot Gurney, PharmD, BCPS Clinical Pharmacist  09/04/2023 7:37 AM

## 2023-09-04 NOTE — Evaluation (Signed)
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 Admitted with acute on chronic hypoxic hypercapnic respiratory failure requiring BiPAP   Clinical Impression   Pt was seen for OT evaluation this date. Prior to hospital admission, pt was generally independent with ADL, light meal prep, med mgt, and mobility. Pt notes she is "supposed" to use her rollator more but has not been. Pt lives at home with her son. Pt at baseline required 3L O2 at night only per pt. Currently on 4L O2 at time of evaluation. SpO2 remained in low to mid 90's. Questionable pleth at times providing mid to high 80's reading but unsure of accuracy. Pt denied SOB throughout. Pt presents to acute OT demonstrating impaired ADL performance and functional mobility 2/2 decreased activity tolerance, balance, strength, and cardiopulmonary status (See OT problem list for additional functional deficits). Pt currently requires PRN MIN A for LB ADL tasks and supv-CGA for ADL mobility. Pt instructed in energy conservation strategies including AE/DME for ADL, activity pacing, work simplification, and PLB. Pt verbalized understanding and would benefit from skilled OT services to address noted impairments and functional limitations (see below for any additional details) in order to maximize safety and independence while minimizing falls risk and caregiver burden. Follow up therapy per [facility].    If plan is discharge home, recommend the following: Assistance with cooking/housework;Assist for transportation    Functional Status Assessment  Patient has had a recent  decline in their functional status and demonstrates the ability to make significant improvements in function in a reasonable and predictable amount of time.  Equipment Recommendations  None recommended by OT    Recommendations for Other Services       Precautions / Restrictions Precautions Precautions: Fall Restrictions Weight Bearing Restrictions: No      Mobility Bed Mobility Overal bed mobility: Needs Assistance Bed Mobility: Supine to Sit     Supine to sit: Supervision, HOB elevated, Used rails     General bed mobility comments: increased time/effort    Transfers Overall transfer level: Needs assistance Equipment used: Rolling walker (2 wheels) Transfers: Sit to/from Stand Sit to Stand: Supervision                  Balance Overall balance assessment: Needs assistance Sitting-balance support: No upper extremity supported, Feet unsupported Sitting balance-Leahy Scale: Normal     Standing balance support: During functional activity, No upper extremity supported Standing balance-Leahy Scale: Good                             ADL either performed or assessed with clinical judgement   ADL Overall ADL's : Needs assistance/impaired                     Lower Body Dressing: Minimal assistance;Sit to/from stand Lower Body Dressing Details (indicate cue type and reason): PRN Toilet Transfer: Supervision/safety;Rolling walker (2 wheels);BSC/3in1           Functional mobility during ADLs: Supervision/safety General ADL Comments: Pt currently requires PRN MIN A for LB ADL tasks, Supv-CGA for ADL mobility wiht 2WW.  PRN VC for utilizing PLB to support breath recovery.     Vision         Perception         Praxis         Pertinent Vitals/Pain Pain Assessment Pain Assessment: No/denies pain     Extremity/Trunk Assessment Upper Extremity Assessment Upper Extremity Assessment: Generalized weakness   Lower Extremity  Assessment Lower Extremity Assessment: Generalized weakness       Communication Communication Communication: No apparent difficulties Cueing Techniques: Verbal cues   Cognition Arousal: Alert Behavior During Therapy: WFL for tasks assessed/performed Overall Cognitive Status: Within Functional Limits for tasks assessed                                       General Comments  VSS monitored throughout session; desat of SpO2 79% on 4L [place] in pre-mobility but able to correct to SpO2 98% on 4L [place] with breathing cuing; slight desat of SpO2 86% on 4L [place] during amb activity which improved with standing break to SpO2 95% on 4L [place]    Exercises Other Exercises Other Exercises: Pt instructed in energy conservation strategies including AE/DME for ADL, activity pacing, work simplification, and PLB.   Shoulder Instructions      Home Living Family/patient expects to be discharged to:: Private residence Living Arrangements: Children (son) Available Help at Discharge: Family;Available PRN/intermittently Type of Home: Apartment Home Access: Stairs to enter Entrance Stairs-Number of Steps: 2   Home Layout: One level     Bathroom Shower/Tub: Chief Strategy Officer: Standard     Home Equipment: Rollator (4 wheels);Shower seat;Grab bars - tub/shower;Hand held shower head          Prior Functioning/Environment Prior Level of Function : Independent/Modified Independent             Mobility Comments: pt reported no AD at home, mentioned O2 at night and 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 blister packs for meds, light meal prep, 1 fall in 40mo, doesn't drive [facility] provides transportation, [facility] and Wed. Pt and daughter do housekeeping tasks        OT Problem List: Decreased strength;Decreased activity tolerance;Cardiopulmonary  status limiting activity;Decreased knowledge of use of DME or AE;Impaired balance (sitting and/or standing)      OT Treatment/Interventions: Self-care/ADL training;Therapeutic exercise;Therapeutic activities;Energy conservation;DME and/or AE instruction;Patient/family education;Balance training    OT Goals(Current goals can be found in the care plan section) Acute Rehab OT Goals Patient Stated Goal: go home OT Goal Formulation: With patient Time For Goal Achievement: 09/18/23 Potential to Achieve Goals: Good ADL Goals Additional ADL Goal #1: Pt will complete all aspects of showering primarily from seated position with modified independence, SpO2 >90% throughout, 2/2 opportunities. Additional ADL Goal #2: Pt will verbalize plan to implement at least 2 learned ECS into daily ADL/IADL routines to improve safety and independence.  OT Frequency: Min 1X/week    Co-evaluation              AM-PAC OT "6 Clicks" Daily Activity     Outcome Measure Help from another person eating meals?: None Help from another person taking care of personal grooming?: None Help from another person toileting, which includes using toliet, bedpan, or urinal?: A Little Help from another person bathing (including  washing, rinsing, drying)?: A Little Help from another person to put on and taking off regular upper body clothing?: None Help from another person to put on and taking off regular lower body clothing?: A Little 6 Click Score: 21   End of Session Equipment Utilized During Treatment: Rolling walker (2 wheels);Oxygen  Activity Tolerance: Patient tolerated treatment well Patient left: in chair;with call bell/phone within reach  OT Visit Diagnosis: Other abnormalities of gait and mobility (R26.89);Muscle weakness (generalized) (M62.81)                Time: 6295-2841 OT Time Calculation (min): 29 min Charges:  OT General Charges $OT Visit: 1 Visit OT Evaluation $OT Eval Low Complexity: 1 Low OT  Treatments $Self Care/Home Management : 8-22 mins  Arman Filter., MPH, MS, OTR/L ascom [phone] 09/04/23, 4:22 PM

## 2023-09-04 NOTE — Progress Notes (Signed)
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 Admitted with acute on chronic hypoxic hypercapnic respiratory failure requiring BiPAP    PT Comments  Pt is received in recliner, she is agreeable to PT session. Pt endorses no SOB prior to mobility activities and reports MD notified her of using 2L [place] throughout the day as new baseline. VSS monitoring throughout session with Pt desat to SpO2 79% on 4L [place] prior to standing activities but able to correct to SpO2 98% on 4L [place] following breathing cues, and slight desat of SpO2 86% on 4L [place] during amb which improved to SpO2 95% on 4L [place] following brief standing break towards end of amb activity. Pt able to amb approx 95 ft using 1 hand held assist for first 45 ft and progressing to no BUE support for remainder distance. Overall, Pt demonstrates progressions towards PT goals in today's session. Pt would benefit from cont skilled PT to address above deficits and promote optimal return to PLOF.    If plan is discharge home, recommend the following: A little help with walking and/or transfers;A little help with bathing/dressing/bathroom;Assistance with cooking/housework;Assist for transportation;Help with stairs or ramp for entrance   Can travel by private vehicle        Equipment Recommendations  Rolling walker (2 wheels);BSC/3in1    Recommendations for Other Services       Precautions / Restrictions Precautions Precautions: Fall Restrictions Weight Bearing Restrictions: No     Mobility  Bed Mobility               General bed mobility comments: Deferred due to  Pt in recliner pre/post session    Transfers Overall transfer level: Needs assistance Equipment used: None Transfers: Sit to/from Stand Sit to Stand: Supervision           General transfer comment: Able to perform STS with no cuing or AD; no LOB noted    Ambulation/Gait Ambulation/Gait assistance: Contact guard assist Gait Distance (Feet): 95 Feet Assistive device: 1 person hand held assist, None Gait Pattern/deviations: WFL(Within Functional Limits), Step-through pattern, Decreased stride length Gait velocity: slight decreased     General Gait Details: Able to amb approx 45 ft with 1 HHA with no LOB noted and the remainder of the distance without AD   Stairs             Wheelchair Mobility     Tilt Bed    Modified Rankin (Stroke Patients Only)       Balance Overall balance assessment: Needs assistance Sitting-balance support: No upper extremity supported, Feet unsupported Sitting balance-Leahy Scale: Normal Sitting balance - Comments: Able to maintain static and dynamic seated balance at edge of recliner without LOB   Standing balance support: During functional activity, No upper extremity supported, Single extremity supported Standing balance-Leahy Scale: Good Standing balance comment: able to maintain static and dynamic standing balance with 1 UE support and progressed to no BUE support                            Cognition Arousal: Alert Behavior During Therapy: [facility] for  tasks assessed/performed Overall Cognitive Status: Within Functional Limits for tasks assessed                                 General Comments: AO x4; Pleasant and motivated to work with PT        Exercises Other Exercises Other Exercises: Educated Pt on breathing technique to improve oxygenation during amb activity    General Comments General comments (skin integrity, edema, etc.): VSS monitored throughout session; desat of SpO2 79% on 4L [place] in  pre-mobility but able to correct to SpO2 98% on 4L [place] with breathing cuing; slight desat of SpO2 86% on 4L [place] during amb activity which improved with standing break to SpO2 95% on 4L [place]      Pertinent Vitals/Pain Pain Assessment Pain Assessment: No/denies pain    Home Living Family/patient expects to be discharged to:: Private residence Living Arrangements: Children (son) Available Help at Discharge: Family;Available PRN/intermittently Type of Home: Apartment Home Access: Stairs to enter   Entrance Stairs-Number of Steps: 2   Home Layout: One level Home Equipment: Rollator (4 wheels);Shower seat;Grab bars - tub/shower;Hand held shower head      Prior Function            PT Goals (current goals can now be found in the care plan section) Acute Rehab PT Goals Patient Stated Goal: to eat to get her strength back PT Goal Formulation: With patient Time For Goal Achievement: 09/17/23 Potential to Achieve Goals: Good Progress towards PT goals: Progressing toward goals    Frequency    Min 1X/week      PT Plan      Co-evaluation              AM-PAC PT "6 Clicks" Mobility   Outcome Measure  Help needed turning from your back to your side while in a flat bed without using bedrails?: None Help needed moving from lying on your back to sitting on the side of a flat bed without using bedrails?: None Help needed moving to and from a bed to a chair (including a wheelchair)?: None   Help needed to walk in hospital room?: None Help needed climbing 3-5 steps with a railing? : A Little 6 Click Score: 19    End of Session Equipment Utilized During Treatment: Oxygen Activity Tolerance: Patient tolerated treatment well Patient left: with call bell/phone within reach;in chair Nurse Communication: Mobility status PT Visit Diagnosis: Other abnormalities of gait and mobility (R26.89);Difficulty in walking, not elsewhere classified (R26.2);Muscle weakness (generalized) (M62.81)      Time: 1610-9604 PT Time Calculation (min) (ACUTE ONLY): 15 min  Charges:                            Elmon Else, SPT    Gene Glazebrook 09/04/2023, 3:05 PM

## 2023-09-04 NOTE — TOC Initial Note (Signed)
Transition of Care [facility]) - Initial/Assessment Note    Patient Details  Name: Caroline Coleman MRN: [id number] Date of Birth: [birth year]  Transition of Care [facility]) CM/SW Contact:    Chapman Fitch, RN Phone Number: 09/04/2023, 4:24 PM  Clinical Narrative:                  Admitted for: Respiratory Failure.  Admitted from: From home.  Son lives with her PCP [facility] with [facility]  Current home health/prior home health/DME: Rollator, O2 Nebulizer  Patient states that she goes to the [facility] 2 days a week.  Mondays and Wednesday.  Therapy is recommending home health, RW, and BSC.  Patient states she is agreeable to therapy.  Declines RW and BSC.  States that her rollator is from [facility] and is old, and would like to see if she can get a new one.  TOC to follow up with [facility]  Expected Discharge Plan: Home w Home Health Services ([facility])     Patient Goals and CMS Choice            Expected Discharge Plan and Services                                              Prior Living Arrangements/Services                       Activities of Daily Living   ADL Screening (condition at time of admission) Independently performs ADLs?: Yes (appropriate for developmental age) Is the patient deaf or have difficulty hearing?: No Does the patient have difficulty seeing, even when wearing glasses/contacts?: No Does the patient have difficulty concentrating, remembering, or making decisions?: No  Permission Sought/Granted                  Emotional Assessment              Admission diagnosis:  Somnolence [R40.0] Polypharmacy [Z79.899] COPD with acute exacerbation (HCC) [J44.1] Altered mental status, unspecified altered mental status type [R41.82] 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) [J96.01, J96.12] Patient Active Problem List   Diagnosis Date Noted   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) 09/03/2023    Somnolence 09/03/2023   Altered mental status 09/03/2023   Anxiety 03/15/2023   Sepsis (HCC) 03/14/2023   Acute encephalopathy 03/14/2023   Acute on chronic respiratory failure with hypoxia (HCC) 03/03/2022   COPD with acute exacerbation (HCC) 08/16/2021   Community acquired pneumonia of right lower lobe of lung 06/25/2021   HLD (hyperlipidemia) 06/25/2021   Depression with anxiety 06/25/2021   Tobacco abuse 06/25/2021   Acute respiratory failure with hypoxia (HCC) 06/25/2021   COPD exacerbation (HCC) 01/27/2021   Nicotine dependence 01/27/2021   Acute respiratory failure (HCC) 08/24/2018   Self-inflicted laceration of wrist (HCC) 08/03/2016   Substance induced mood disorder (HCC) 08/03/2016   Alcohol abuse 08/03/2016   Panic disorder with agoraphobia 12/14/2015   Insomnia 12/14/2015   Depression 12/14/2015   Acquired hypothyroidism 05/04/2015   Colon polyps 05/04/2015   COPD, moderate (HCC) 05/04/2015   Edentulous 05/04/2015   Family history of early CAD 05/04/2015   Generalized anxiety disorder 05/04/2015   Goiter 05/04/2015   Pure hypercholesterolemia 05/04/2015   S/P lens implant 05/04/2015   PCP:  Gwendlyn Deutscher, MD Pharmacy:   [facility] -  [place], [place] - [address] [place] [address] Phone: [phone] Fax: [phone]     Social Determinants of Health (SDOH) Social History: SDOH Screenings   Food Insecurity: No Food Insecurity (09/03/2023)  Housing: Low Risk  (09/03/2023)  Transportation Needs: No Transportation Needs (09/03/2023)  Utilities: Not At Risk (09/03/2023)  Tobacco Use: High Risk (09/03/2023)   SDOH Interventions:     Readmission Risk Interventions    09/04/2023    4:24 PM  Readmission Risk Prevention Plan  Transportation Screening Complete  PCP or Specialist Appt within 3-5 Days Complete  HRI or Home Care Consult Complete  Medication Review (RN Care Manager) Complete

## 2023-09-05 DIAGNOSIS — J9612 Chronic respiratory failure with hypercapnia: Secondary | ICD-10-CM | POA: Diagnosis not present

## 2023-09-05 DIAGNOSIS — J9601 Acute respiratory failure with hypoxia: Secondary | ICD-10-CM | POA: Diagnosis not present

## 2023-09-05 DIAGNOSIS — J441 Chronic obstructive pulmonary disease with (acute) exacerbation: Secondary | ICD-10-CM

## 2023-09-05 LAB — MAGNESIUM: Magnesium: 2.5 mg/dL — ABNORMAL HIGH (ref 1.7–2.4)

## 2023-09-05 LAB — BASIC METABOLIC PANEL
Anion gap: 9 (ref 5–15)
BUN: 23 mg/dL (ref 8–23)
CO2: 34 mmol/L — ABNORMAL HIGH (ref 22–32)
Calcium: 8.6 mg/dL — ABNORMAL LOW (ref 8.9–10.3)
Chloride: 96 mmol/L — ABNORMAL LOW (ref 98–111)
Creatinine, Ser: 0.66 mg/dL (ref 0.44–1.00)
GFR, Estimated: >60 mL/min (ref >60)
Glucose, Bld: 102 mg/dL — ABNORMAL HIGH (ref 70–99)
Potassium: 3.7 mmol/L (ref 3.5–5.1)
Sodium: 139 mmol/L (ref 135–145)

## 2023-09-05 LAB — BLOOD GAS, VENOUS
Bicarbonate: 51.9 mmol/L — ABNORMAL HIGH (ref 20.0–28.0)
O2 Saturation: 32.7 mmol/L — ABNORMAL HIGH (ref 0.0–2.0)
Patient temperature: 37
Patient temperature: 37 %
pCO2, Ven: 103 mm[Hg] (ref 44–60)
pH, Ven: 7.31 (ref 7.25–7.43)
pO2, Ven: <51.9 mmol/L — CL (ref 32–45)

## 2023-09-05 LAB — CBC
HCT: 33.2 % — ABNORMAL LOW (ref 36.0–46.0)
Hemoglobin: 10.4 g/dL — ABNORMAL LOW (ref 12.0–15.0)
MCH: 26.8 pg (ref 26.0–34.0)
MCHC: 31.3 g/dL (ref 30.0–36.0)
MCV: 85.6 fL (ref 80.0–100.0)
Platelets: 127 10*3/uL — ABNORMAL LOW (ref 150–400)
RBC: 3.88 MIL/uL (ref 3.87–5.11)
RDW: 14.3 % (ref 11.5–15.5)
WBC: 8.1 10*3/uL (ref 4.0–10.5)
nRBC: 0 % (ref 0.0–0.2)

## 2023-09-05 LAB — PHOSPHORUS: Phosphorus: 4.2 mg/dL (ref 2.5–4.6)

## 2023-09-05 MED ORDER — SIMETHICONE 80 MG PO CHEW
80.0000 mg | CHEWABLE_TABLET | Freq: Four times a day (QID) | ORAL | Status: DC
  Administered 2023-09-05 – 2023-09-06 (×3): 80 mg via ORAL
  Filled 2023-09-05 (×6): qty 1

## 2023-09-05 MED ORDER — AZITHROMYCIN 250 MG PO TABS
500.0000 mg | ORAL_TABLET | Freq: Every day | ORAL | Status: DC
  Administered 2023-09-06: 500 mg via ORAL
  Filled 2023-09-05: qty 2

## 2023-09-05 MED ORDER — LACTATED RINGERS IV BOLUS
500.0000 mL | Freq: Once | INTRAVENOUS | Status: AC
  Administered 2023-09-05: 500 mL via INTRAVENOUS

## 2023-09-05 MED ORDER — IPRATROPIUM-ALBUTEROL 0.5-2.5 (3) MG/3ML IN SOLN
3.0000 mL | Freq: Two times a day (BID) | RESPIRATORY_TRACT | Status: DC
  Administered 2023-09-05 – 2023-09-06 (×2): 3 mL via RESPIRATORY_TRACT
  Filled 2023-09-05 (×2): qty 3

## 2023-09-05 MED ORDER — METHYLPREDNISOLONE SODIUM SUCC 40 MG IJ SOLR
40.0000 mg | Freq: Two times a day (BID) | INTRAMUSCULAR | Status: DC
  Administered 2023-09-05 – 2023-09-06 (×2): 40 mg via INTRAVENOUS
  Filled 2023-09-05 (×2): qty 1

## 2023-09-05 NOTE — Progress Notes (Signed)
PHARMACY CONSULT NOTE  Pharmacy Consult for Electrolyte Monitoring and Replacement   Recent Labs: Potassium (mmol/L)  Date Value  09/05/2023 3.7  10/01/2014 3.7   Magnesium (mg/dL)  Date Value  16/08/9603 2.5 (H)   Calcium (mg/dL)  Date Value  54/07/8118 8.6 (L)   Calcium, Total (mg/dL)  Date Value  14/78/2956 8.6   Albumin (g/dL)  Date Value  21/30/8657 4.0  01/16/2013 3.7   Phosphorus (mg/dL)  Date Value  84/69/6295 4.2   Sodium (mmol/L)  Date Value  09/05/2023 139  10/01/2014 139     Assessment: 67 y.o with female with significant PMH of COPD on 3 L O2 at night, hypothyroidism, anxiety and depression, tobacco abuse, and 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 Pharmacy is asked to follow and replace electrolytes while in CCU  Goal of Therapy:  Electrolytes WNL  Plan:  ---no electrolyte replacement warranted for today ---recheck electrolytes in am  Elliot Gurney, PharmD, BCPS Clinical Pharmacist  09/05/2023 7:45 AM

## 2023-09-05 NOTE — TOC Progression Note (Signed)
Transition of Care [facility]) - Progression Note    Patient Details  Name: Caroline Coleman MRN: [id number] Date of Birth: [birth year]  Transition of Care [facility]) CM/SW Contact  Chapman Fitch, RN Phone Number: 09/05/2023, 3:46 PM  Clinical Narrative:     Charisse March with [facility] aware of admission and to notify [facility] interdisciplinary team   Expected Discharge Plan: Home w Home Health Services ([facility])    Expected Discharge Plan and Services                                               Social Determinants of Health (SDOH) Interventions SDOH Screenings   Food Insecurity: No Food Insecurity (09/03/2023)  Housing: Low Risk  (09/03/2023)  Transportation Needs: No Transportation Needs (09/03/2023)  Utilities: Not At Risk (09/03/2023)  Tobacco Use: High Risk (09/03/2023)    Readmission Risk Interventions    09/04/2023    4:24 PM  Readmission Risk Prevention Plan  Transportation Screening Complete  PCP or Specialist Appt within 3-5 Days Complete  HRI or Home Care Consult Complete  Medication Review (RN Care Manager) Complete

## 2023-09-05 NOTE — Progress Notes (Signed)
PHARMACIST - PHYSICIAN COMMUNICATION DR:   Mayford Knife CONCERNING: Antibiotic IV to Oral Route Change Policy  RECOMMENDATION: This patient is receiving azithromycin by the intravenous route.  Based on criteria approved by the Pharmacy and Therapeutics Committee, the antibiotic(s) is/are being converted to the equivalent oral dose form(s).   DESCRIPTION: These criteria include: Patient being treated for a respiratory tract infection, urinary tract infection, cellulitis or clostridium difficile associated diarrhea if on metronidazole The patient is not neutropenic and does not exhibit a GI malabsorption state The patient is eating (either orally or via tube) and/or has been taking other orally administered medications for a least 24 hours The patient is improving clinically and has a Tmax < 100.5    Elliot Gurney, PharmD, BCPS Clinical Pharmacist  09/05/2023 2:10 PM

## 2023-09-05 NOTE — Plan of Care (Signed)

## 2023-09-05 NOTE — Progress Notes (Signed)
PROGRESS NOTE   HPI was taken from Dr. 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   Per ED reports, EMS was called by patient's son due to concern that she was going to fall.  On EMS arrival, patient was hypoxic with sats in the 70s with no oxygen.  Patient received albuterol treatment due to wheezing with increased in sats to 95% patient was complaining of shortness of breath, intermittent chest pain and headache.   ED Course: Initial vital signs showed HR of 85 beats/minute, BP 115/72 mm Hg, the RR 14 breaths/minute, and the oxygen saturation 90% on 3L and a temperature of 98.77F (37C).  Pertinent Labs/Diagnostics Findings: Glucose:122, CO2:38 Plts:118 PCT: negative <0.10  ABG: pO2 61; pCO2 85; pH 7.36;  HCO3 48 %O2 Sat 94.2.  CXR> CTH> negative   Due to patient's somnolence she received 2 rounds of Narcan without any improvement.  She was only arousable to vigorous sternal rub and to voice intermittently but remained drowsy.  Blood gas shows hypercapnia therefore she was placed on BiPAP.  Due to high risk for decompensation, PCCM consulted for admission.   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 Initially required BiPAP but as since been weaned off. Continue on supplemental oxygen and wean as tolerated  Severe COPD exacerbation: continue on steroids, bronchodilators & encourage incentive  spirometry  Metabolic encephalopathy: improving  CAP: continue on IV rocephin, azithromycin, steroids, bronchodilators & encourage incentive spirometry   Depression: severity unknown. Continue on home dose of paroxetine         DVT prophylaxis: lovenox Code Status: full  Family Communication:  Disposition Plan: likely  d/c back home   Level of care: med surg  Consultants:  ICU  Procedures:  Antimicrobials: rocephin, azithromycin    Subjective: Pt c/o shortness of breath   Objective: Vitals:   09/05/23 0600 09/05/23 0700 09/05/23 0701 09/05/23 0800  BP: 96/63 (!) 95/51  (!) 92/51  Pulse: (!) 57 (!) 59  65  Resp: 14 16  17   Temp:   97.9 F (36.6 C)   TempSrc:   Oral   SpO2: 97% (!) 86%  94%  Weight:        Intake/Output Summary (Last 24 hours) at 09/05/2023 0923 Last data filed at 09/05/2023 0600 Gross per 24 hour  Intake 2197.7 ml  Output 500 ml  Net 1697.7 ml   Filed Weights   09/03/23 0818 09/05/23 0414  Weight: 73.2 kg 75.6 kg    Examination:  General exam: Appears calm and comfortable  Respiratory system: diminished breath sounds b/l. Wheezes b/l  Cardiovascular system: S1 & S2 +. No  rubs, gallops or clicks Gastrointestinal system: Abdomen is nondistended, soft and nontender. Normal bowel sounds heard. Central nervous system: Alert and oriented. Moves all extremities  Psychiatry: Judgement and insight appear normal. Mood & affect appropriate.     Data Reviewed: I have personally reviewed following labs and  imaging studies  CBC: Recent Labs  Lab 09/03/23 0045 09/04/23 0340 09/05/23 0335  WBC 8.1 7.1 8.1  NEUTROABS 6.2  --   --   HGB 12.4 11.3* 10.4*  HCT 43.3 36.7 33.2*  MCV 92.5 86.6 85.6  PLT 118* 125* 127*   Basic Metabolic Panel: Recent Labs  Lab 09/03/23 0045 09/04/23 0340 09/05/23 0335  NA 138 139 139  K 4.0 3.8 3.7  CL 88* 92* 96*  CO2 38* 34* 34*  GLUCOSE 122* 141* 102*  BUN 10 17 23   CREATININE 0.63 0.61 0.66   CALCIUM 9.1 9.1 8.6*  MG  --  2.3 2.5*  PHOS  --  4.0 4.2   GFR: Estimated Creatinine Clearance: 68.9 mL/min (by C-G formula based on SCr of 0.66 mg/dL). Liver Function Tests: Recent Labs  Lab 09/03/23 0045  AST 13*  ALT 6  ALKPHOS 74  BILITOT 0.6  PROT 7.6  ALBUMIN 4.0   No results for input(s): "LIPASE", "AMYLASE" in the last 168 hours. No results for input(s): "AMMONIA" in the last 168 hours. Coagulation Profile: No results for input(s): "INR", "PROTIME" in the last 168 hours. Cardiac Enzymes: No results for input(s): "CKTOTAL", "CKMB", "CKMBINDEX", "TROPONINI" in the last 168 hours. BNP (last 3 results) No results for input(s): "PROBNP" in the last 8760 hours. HbA1C: No results for input(s): "HGBA1C" in the last 72 hours. CBG: Recent Labs  Lab 09/03/23 0820  GLUCAP 169*   Lipid Profile: No results for input(s): "CHOL", "HDL", "LDLCALC", "TRIG", "CHOLHDL", "LDLDIRECT" in the last 72 hours. Thyroid Function Tests: Recent Labs    09/03/23 0045  TSH 1.080   Anemia Panel: No results for input(s): "VITAMINB12", "FOLATE", "FERRITIN", "TIBC", "IRON", "RETICCTPCT" in the last 72 hours. Sepsis Labs: Recent Labs  Lab 09/03/23 0045  PROCALCITON <0.10    Recent Results (from the past 240 hour(s))  Blood culture (routine x 2)     Status: None (Preliminary result)   Collection Time: 09/03/23  6:59 AM   Specimen: BLOOD  Result Value Ref Range Status   Specimen Description BLOOD BLOOD RIGHT ARM  Final   Special Requests   Final    BOTTLES DRAWN AEROBIC AND ANAEROBIC Blood Culture results may not be optimal due to an inadequate volume of blood received in culture bottles   Culture   Final    NO GROWTH 2 DAYS Performed at [facility], [address], [place], [place] [address]    Report Status PENDING  Incomplete  MRSA Next Gen by PCR, Nasal     Status: Abnormal   Collection Time: 09/03/23  8:21 AM   Specimen: Nasal Mucosa; Nasal Swab  Result Value Ref  Range Status   MRSA by PCR Next Gen DETECTED (A) NOT DETECTED Final    Comment: RESULT CALLED TO, READ BACK BY AND VERIFIED WITH: HANNA SPROUT AT 7846 09/03/23.PMF (NOTE) The GeneXpert MRSA Assay (FDA approved for NASAL specimens only), is one component of a comprehensive MRSA colonization surveillance program. It is not intended to diagnose MRSA infection nor to guide or monitor treatment for MRSA infections. Test performance is not FDA approved in patients less than 1 years old. Performed at [facility], [address] [place], [place] [address]   Blood culture (routine x 2)     Status: None (Preliminary result)   Collection Time: 09/03/23  8:50 AM   Specimen: BLOOD  Result Value Ref Range Status   Specimen Description BLOOD BLOOD RIGHT HAND  Final  Special Requests   Final    BOTTLES DRAWN AEROBIC AND ANAEROBIC Blood Culture adequate volume   Culture   Final    NO GROWTH 2 DAYS Performed at [facility], [address] [place], [place] [address]    Report Status PENDING  Incomplete  Respiratory (~20 pathogens) panel by PCR     Status: None   Collection Time: 09/03/23  9:42 AM   Specimen: Nasopharyngeal Swab; Respiratory  Result Value Ref Range Status   Adenovirus NOT DETECTED NOT DETECTED Final   Coronavirus 229E NOT DETECTED NOT DETECTED Final    Comment: (NOTE) The Coronavirus on the Respiratory Panel, DOES NOT test for the novel  Coronavirus (2019 nCoV)    Coronavirus HKU1 NOT DETECTED NOT DETECTED Final   Coronavirus NL63 NOT DETECTED NOT DETECTED Final   Coronavirus OC43 NOT DETECTED NOT DETECTED Final   Metapneumovirus NOT DETECTED NOT DETECTED Final   Rhinovirus / Enterovirus NOT DETECTED NOT DETECTED Final   Influenza A NOT DETECTED NOT DETECTED Final   Influenza B NOT DETECTED NOT DETECTED Final   Parainfluenza Virus 1 NOT DETECTED NOT DETECTED Final   Parainfluenza Virus 2 NOT DETECTED NOT DETECTED Final   Parainfluenza Virus 3 NOT  DETECTED NOT DETECTED Final   Parainfluenza Virus 4 NOT DETECTED NOT DETECTED Final   Respiratory Syncytial Virus NOT DETECTED NOT DETECTED Final   Bordetella pertussis NOT DETECTED NOT DETECTED Final   Bordetella Parapertussis NOT DETECTED NOT DETECTED Final   Chlamydophila pneumoniae NOT DETECTED NOT DETECTED Final   Mycoplasma pneumoniae NOT DETECTED NOT DETECTED Final    Comment: Performed at [facility], [address], [place], [place] [address]  SARS Coronavirus 2 by RT PCR (hospital order, performed in [facility] hospital lab) *cepheid single result test* Anterior Nasal Swab     Status: None   Collection Time: 09/03/23  9:42 AM   Specimen: Anterior Nasal Swab  Result Value Ref Range Status   SARS Coronavirus 2 by RT PCR NEGATIVE NEGATIVE Final    Comment: (NOTE) SARS-CoV-2 target nucleic acids are NOT DETECTED.  The SARS-CoV-2 RNA is generally detectable in upper and lower respiratory specimens during the acute phase of infection. The lowest concentration of SARS-CoV-2 viral copies this assay can detect is 250 copies / mL. A negative result does not preclude SARS-CoV-2 infection and should not be used as the sole basis for treatment or other patient management decisions.  A negative result may occur with improper specimen collection / handling, submission of specimen other than nasopharyngeal swab, presence of viral mutation(s) within the areas targeted by this assay, and inadequate number of viral copies (<250 copies / mL). A negative result must be combined with clinical observations, patient history, and epidemiological information.  Fact Sheet for Patients:   RoadLapTop.co.za  Fact Sheet for Healthcare Providers: http://kim-miller.com/  This test is not yet approved or  cleared by the [place] FDA and has been authorized for detection and/or diagnosis of SARS-CoV-2 by FDA under an Emergency Use Authorization  (EUA).  This EUA will remain in effect (meaning this test can be used) for the duration of the COVID-19 declaration under Section 564(b)(1) of the Act, 21 U.S.C. section 360bbb-3(b)(1), unless the authorization is terminated or revoked sooner.  Performed at [facility], [address], [place], [place] [address]          Radiology Studies: No results found.      Scheduled Meds:  arformoterol  15 mcg Nebulization  BID   vitamin C  500 mg Oral BID   buprenorphine-naloxone  0.5 tablet Sublingual Daily   Chlorhexidine Gluconate Cloth  6 each Topical Q0600   enoxaparin (LOVENOX) injection  40 mg Subcutaneous Q24H   feeding supplement  237 mL Oral BID BM   ipratropium-albuterol  3 mL Nebulization BID   levothyroxine  137 mcg Oral Q0600   midodrine  10 mg Oral TID WC   mirtazapine  7.5 mg Oral QHS   multivitamin with minerals  1 tablet Oral Daily   PARoxetine  20 mg Oral Daily   predniSONE  40 mg Oral Q breakfast   Continuous Infusions:  azithromycin Stopped (09/04/23 0823)   cefTRIAXone (ROCEPHIN)  IV 200 mL/hr at 09/05/23 0600     LOS: 2 days        Charise Killian, MD [facility] Pager [phone]  If 7PM-7AM, please contact night-coverage [email or web address]  09/05/2023, 9:23 AM

## 2023-09-05 NOTE — Progress Notes (Signed)
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 Admitted with acute on chronic hypoxic hypercapnic respiratory failure requiring BiPAP    PT Comments  Pt received in bed and agreed to PT session. Pt is on 4L [place] at rest. Pt performed bed mobility to sit EOB with Min A. Pt reported that she would feel comfortable amb with hand held assist. Pt performed STS with supervision. While amb, hand held assist was not used, pt then amb CGA. Pt amb in the hallway 268ft and back to room where session ended with pt in recliner MinA. During amb, pt presented with a "wobbly" gait which pt recognized as well. Pt did not experience any dizziness or light headedness, however felt that they could not walk in a straight line due to it being the first time they amb today. 4L of O2 was used throughout the session. Pt tolerated Tx well and will continue to benefit from skilled PT sessions following d/c in order to improve functional mobility, balance, and strength.     If plan is discharge home, recommend the following: A little help with walking and/or transfers;A little help with bathing/dressing/bathroom;Assist for transportation;Help with stairs or ramp for entrance   Can travel by private vehicle        Equipment Recommendations       Recommendations for Other Services       Precautions / Restrictions Precautions Precautions: Fall Precaution Comments: watch SpO2 Restrictions Weight Bearing Restrictions: No     Mobility  Bed Mobility Overal bed mobility: Needs Assistance Bed Mobility: Supine to Sit     Supine to sit: Min  assist     General bed mobility comments: Pt performed bed mobility MinA.    Transfers Overall transfer level: Needs assistance Equipment used: 1 person hand held assist Transfers: Sit to/from Stand Sit to Stand: Supervision           General transfer comment: Pt performed STS with supervision. Once pt began to take their steps, they used 1 person hand held assist.    Ambulation/Gait Ambulation/Gait assistance: Contact guard assist Gait Distance (Feet): 200 Feet Assistive device: 1 person hand held assist Gait Pattern/deviations: Step-through pattern Gait velocity: Performed at slow pace.     General Gait Details: Pt stated that they initially wanted to perform amb with hand held assist. Due to comfortability with amb, pt did not need hand held assist, however clinician performed CGA during amb. While amb, pt presented with a "sway" gait which pt also recognized. Pt reports that they did not feel dizzy or light headed, however it was challenging to amb in a straight line after not getting up to walk all day. Vitals staed WNL while on 4L of O2 [place].   Stairs             Wheelchair Mobility     Tilt Bed    Modified Rankin (Stroke Patients Only)       Balance Overall balance assessment: Modified Independent Sitting-balance support: Feet supported, No upper extremity supported Sitting balance-Leahy Scale: Good     Standing balance support: No upper extremity supported Standing balance-Leahy Scale: Good  Cognition Arousal: Alert Behavior During Therapy: WFL for tasks assessed/performed Overall Cognitive Status: Within Functional Limits for tasks assessed                                 General Comments: Pt pleasant and willing to participate in PT session        Exercises      General Comments General comments (skin integrity, edema, etc.): Pt amb 200 ft well today while on 4L of O2.       Pertinent Vitals/Pain Pain Assessment Pain Assessment: No/denies pain    Home Living                          Prior Function            PT Goals (current goals can now be found in the care plan section) Acute Rehab PT Goals Patient Stated Goal: to go home PT Goal Formulation: With patient Time For Goal Achievement: 09/17/23 Potential to Achieve Goals: Good Progress towards PT goals: Progressing toward goals    Frequency    Min 1X/week      PT Plan      Co-evaluation              AM-PAC PT "6 Clicks" Mobility   Outcome Measure  Help needed turning from your back to your side while in a flat bed without using bedrails?: A Little Help needed moving from lying on your back to sitting on the side of a flat bed without using bedrails?: A Little Help needed moving to and from a bed to a chair (including a wheelchair)?: A Little Help needed standing up from a chair using your arms (e.g., wheelchair or bedside chair)?: A Little Help needed to walk in hospital room?: A Little Help needed climbing 3-5 steps with a railing? : A Lot 6 Click Score: 17    End of Session Equipment Utilized During Treatment: Oxygen Activity Tolerance: Patient tolerated treatment well Patient left: in chair Nurse Communication: Mobility status PT Visit Diagnosis: Other abnormalities of gait and mobility (R26.89);Muscle weakness (generalized) (M62.81);Difficulty in walking, not elsewhere classified (R26.2)     Time: 2956-2130 PT Time Calculation (min) (ACUTE ONLY): 12 min  Charges:    $Gait Training: 8-22 mins PT General Charges $$ ACUTE PT VISIT: 1 Visit                     Tayonna Bacha [place] SPT, LAT, ATC    Andrej Spagnoli [place] 09/05/2023, 4:38 PM

## 2023-09-05 NOTE — Progress Notes (Signed)
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 Admitted with acute on chronic hypoxic hypercapnic respiratory failure requiring BiPAP   OT comments  Pt seen for OT tx. Pt endorses improving today. Pt denies complaints. Pt reports she is eager to get back home and states, "I just need to stop smoking cigarettes and start doing more to get stronger and live longer." Pt instructed in how to incorporate learned ECS into daily ADL and IADL routines as well as AE for LB ADL.Pt educated in use of pulse oximeter to better monitor SpO2 and HR during exertional activity to minimize risk of over exertion and SOB. Pt verbalized understanding, at times requiring VC to redirect. Pt progressing towards goals. Continues to benefit from skilled OT services to maximize safety and return to PLOF.       If plan is discharge home, recommend the following:  Assistance with cooking/housework;Assist for transportation   Equipment Recommendations  Other (comment) (reacher, LH sponge, pulse oximeter)    Recommendations for Other Services      Precautions / Restrictions Precautions Precautions: Fall Precaution Comments: watch SpO2 Restrictions Weight Bearing Restrictions: No       Mobility Bed Mobility               General bed mobility comments: NT in recliner    Transfers Overall transfer level: Needs assistance Equipment used: None Transfers: Sit to/from Stand Sit to Stand: Supervision           General transfer comment: VC for hand placement     Balance Overall balance assessment: Modified Independent                                          ADL either performed or assessed with clinical judgement   ADL                                              Extremity/Trunk Assessment              Vision       Perception     Praxis      Cognition Arousal: Alert Behavior During Therapy: WFL for tasks assessed/performed Overall Cognitive Status: Within Functional Limits for tasks assessed                                          Exercises Other Exercises Other Exercises: Pt further instructed in ECS and how to incorporate into daily ADL and IADL routines and AE for LB ADL.    Shoulder Instructions       General Comments Pt amb 200 ft well today while on 4L of O2.    Pertinent Vitals/ Pain       Pain Assessment Pain Assessment: No/denies pain  Home Living  Prior Functioning/Environment              Frequency  Min 1X/week        Progress Toward Goals  OT Goals(current goals can now be found in the care plan section)  Progress towards OT goals: Progressing toward goals  Acute Rehab OT Goals Patient Stated Goal: go home OT Goal Formulation: With patient Time For Goal Achievement: 09/18/23 Potential to Achieve Goals: Good  Plan      Co-evaluation                 AM-PAC OT "6 Clicks" Daily Activity     Outcome Measure   Help from another person eating meals?: None Help from another person taking care of personal grooming?: None Help from another person toileting, which includes using toliet, bedpan, or urinal?: A Little Help from another person bathing (including washing, rinsing, drying)?: A Little Help from another person to put on and taking off regular upper body clothing?: None Help from another person to put on and taking off regular lower body clothing?: A Little 6 Click Score: 21    End of Session Equipment Utilized During  Treatment: Oxygen  OT Visit Diagnosis: Other abnormalities of gait and mobility (R26.89);Muscle weakness (generalized) (M62.81)   Activity Tolerance Patient tolerated treatment well   Patient Left in chair;with call bell/phone within reach   Nurse Communication          Time: 7829-5621 OT Time Calculation (min): 24 min  Charges: OT General Charges $OT Visit: 1 Visit OT Treatments $Self Care/Home Management : 8-22 mins  Arman Filter., MPH, MS, OTR/L ascom [phone] 09/05/23, 4:41 PM

## 2023-09-06 DIAGNOSIS — J9601 Acute respiratory failure with hypoxia: Secondary | ICD-10-CM | POA: Diagnosis not present

## 2023-09-06 DIAGNOSIS — J441 Chronic obstructive pulmonary disease with (acute) exacerbation: Secondary | ICD-10-CM | POA: Diagnosis not present

## 2023-09-06 DIAGNOSIS — J9612 Chronic respiratory failure with hypercapnia: Secondary | ICD-10-CM | POA: Diagnosis not present

## 2023-09-06 LAB — BASIC METABOLIC PANEL
Anion gap: 8 (ref 5–15)
BUN: 22 mg/dL (ref 8–23)
CO2: 32 mmol/L (ref 22–32)
Calcium: 8.4 mg/dL — ABNORMAL LOW (ref 8.9–10.3)
Chloride: 97 mmol/L — ABNORMAL LOW (ref 98–111)
Creatinine, Ser: 0.6 mg/dL (ref 0.44–1.00)
GFR, Estimated: >60 mL/min (ref >60)
Glucose, Bld: 145 mg/dL — ABNORMAL HIGH (ref 70–99)
Potassium: 4.3 mmol/L (ref 3.5–5.1)
Sodium: 137 mmol/L (ref 135–145)

## 2023-09-06 LAB — CBC
HCT: 32.3 % — ABNORMAL LOW (ref 36.0–46.0)
Hemoglobin: 10 g/dL — ABNORMAL LOW (ref 12.0–15.0)
MCH: 26.8 pg (ref 26.0–34.0)
MCHC: 31 g/dL (ref 30.0–36.0)
MCV: 86.6 fL (ref 80.0–100.0)
Platelets: 127 10*3/uL — ABNORMAL LOW (ref 150–400)
RBC: 3.73 MIL/uL — ABNORMAL LOW (ref 3.87–5.11)
RDW: 14.5 % (ref 11.5–15.5)
WBC: 5.8 10*3/uL (ref 4.0–10.5)
nRBC: 0 % (ref 0.0–0.2)

## 2023-09-06 LAB — LEGIONELLA PNEUMOPHILA SEROGP 1 UR AG: L. pneumophila Serogp 1 Ur Ag: NEGATIVE

## 2023-09-06 MED ORDER — PREDNISONE 20 MG PO TABS: 40.0000 mg | ORAL_TABLET | Freq: Every day | ORAL | 0 refills | Status: AC

## 2023-09-06 MED ORDER — ROSUVASTATIN CALCIUM 10 MG PO TABS
20.0000 mg | ORAL_TABLET | Freq: Every day | ORAL | Status: DC
  Administered 2023-09-06: 20 mg via ORAL
  Filled 2023-09-06: qty 2

## 2023-09-06 NOTE — TOC Transition Note (Signed)
Transition of Care [facility]) - CM/SW Discharge Note   Patient Details  Name: YLONDA STORR MRN: [id number] Date of Birth: [birth year]  [facility] CM/SW Contact:  Chapman Fitch, RN Phone Number: 09/06/2023, 10:46 AM   Clinical Narrative:     Patient to discharge today Dr Arta Silence request call from Dr Myriam Forehand.   Provided him with her contact information  Per Dr Arta Silence they will bring a transport chair and O2 to pick patient up today, and take patient to clinic prior to returning home.  Patient updated at bedside  Dr Arta Silence is aware of therapy recommendations and that patient is requesting a new rollator.   [facility] to called bedside RN directly to coordinate transport        Patient Goals and CMS Choice      Discharge Placement                         Discharge Plan and Services Additional resources added to the After Visit Summary for                                       Social Determinants of Health (SDOH) Interventions SDOH Screenings   Food Insecurity: No Food Insecurity (09/03/2023)  Housing: Low Risk  (09/03/2023)  Transportation Needs: No Transportation Needs (09/03/2023)  Utilities: Not At Risk (09/03/2023)  Tobacco Use: High Risk (09/03/2023)     Readmission Risk Interventions    09/04/2023    4:24 PM  Readmission Risk Prevention Plan  Transportation Screening Complete  PCP or Specialist Appt within 3-5 Days Complete  HRI or Home Care Consult Complete  Medication Review (RN Care Manager) Complete

## 2023-09-06 NOTE — Progress Notes (Signed)
Discharge instructions given to and reviewed with patient. Patient verbalized understanding of all discharge instructions including follow up care, new prescription and changes to home med list. Patient left unit by wheelchair, alert with no distress noted on 3 L nasal cannula with Chasity, NT. Patient discharging home with [facility] driver.

## 2023-09-06 NOTE — Progress Notes (Addendum)
PHARMACY CONSULT NOTE  Pharmacy Consult for Electrolyte Monitoring and Replacement   Recent Labs: Potassium (mmol/L)  Date Value  09/06/2023 4.3  10/01/2014 3.7   Magnesium (mg/dL)  Date Value  82/95/6213 2.5 (H)   Calcium (mg/dL)  Date Value  08/65/7846 8.4 (L)   Calcium, Total (mg/dL)  Date Value  96/29/5284 8.6   Albumin (g/dL)  Date Value  13/24/4010 4.0  01/16/2013 3.7   Phosphorus (mg/dL)  Date Value  27/25/3664 4.2   Sodium (mmol/L)  Date Value  09/06/2023 137  10/01/2014 139     Assessment: 67 y.o with female with significant PMH of COPD on 3 L O2 at night, hypothyroidism, anxiety and depression, tobacco abuse, and 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 Pharmacy is asked to follow and replace electrolytes while in CCU  Goal of Therapy:  Electrolytes WNL  Plan:  ---no electrolyte replacement warranted for today. Electrolytes have remained stable for several days. Pharmacy will sign off of electrolyte consult and continue to monitor peripherally.   Elliot Gurney, PharmD, BCPS Clinical Pharmacist  09/06/2023 7:42 AM

## 2023-09-06 NOTE — Discharge Summary (Addendum)
Physician Discharge Summary   Patient: Caroline Coleman MRN: [id number] DOB: 1956/11/20  Admit date:     09/03/2023  Discharge date: 09/06/23  Discharge Physician: Lurene Shadow   PCP: Gwendlyn Deutscher, MD   Recommendations at discharge:   Follow-up with PCP within 1 week of discharge  Discharge Diagnoses: Principal Problem:   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) Active Problems:   COPD with acute exacerbation (HCC)   Somnolence   Altered mental status  Resolved Problems:   * No resolved hospital problems. [facility] Course:  Caroline Coleman, (67 year old woman with medical history significant for COPD, chronic hypoxemic respiratory failure on 3 L/min oxygen, hypothyroidism, anxiety, depression, tobacco use disorder, history of alcohol use disorder in remission for over 8 years.  Presented to the hospital with shortness of breath,, chest pain, headache, generalized weakness, dizziness, unsteady gait.  When EMS picked her up, her oxygen saturation was in the 70s on room air.  She was admitted to the hospital for COPD exacerbation, acute metabolic encephalopathy and acute on chronic hypoxemic respiratory failure.  She was treated with IV steroids, bronchodilators and empiric antibiotics.  She initially required BiPAP for acute hypoxemic respiratory failure.  She was then transitioned to 4 L/min oxygen and was eventually weaned down to 3 L/min oxygen.  Her condition has improved significantly and she wants to go home today.  She said she has been able to ambulate in the hallways without any problems or symptoms.  At the bedside, I turned down her oxygen to 3 L/min and her oxygen saturation was 93%.  She is deemed stable for discharge to home today.  She will be discharged on prednisone 40 mg daily for 2 more days.  She said she follows closely with [facility].         Consultants: None Procedures performed: None  Disposition: Home Diet recommendation:   Discharge Diet Orders (From admission, onward)     Start     Ordered   09/06/23 0000  Diet - low sodium heart healthy        09/06/23 1003           Cardiac diet DISCHARGE MEDICATION: Allergies as of 09/06/2023       Reactions   Penicillin G Swelling   Sulfa Antibiotics Other (See Comments), Swelling   REACTION UNKNOWN Per patient - both hands swell. Hands draw up    REACTION UNKNOWN   Penicillins Other (See Comments)   Has patient had a PCN reaction causing immediate rash, facial/tongue/throat swelling, SOB or lightheadedness with hypotension: no Has patient had a PCN reaction causing severe rash involving mucus membranes or skin necrosis: no Has patient had a PCN reaction that required hospitalization no REACTION UNKNOWN Has patient had a PCN reaction occurring within the last 10 years: yes If all of the above answers are "NO", then may proceed with Cephalosporin use.        Medication List     STOP taking these medications    DULoxetine 60 MG capsule Commonly known as: CYMBALTA       TAKE these medications    acetaminophen 650 MG CR tablet Commonly known as: TYLENOL Take 1,300 mg by mouth 2 (two) times daily.   albuterol 108 (90 Base) MCG/ACT inhaler Commonly known as: VENTOLIN HFA Inhale 2 puffs into the lungs every 6 (six) hours as needed for wheezing or shortness of breath.   bisacodyl 10 MG suppository Commonly known as: DULCOLAX Place 10  mg rectally daily.   cholecalciferol 25 MCG (1000 UNIT) tablet Commonly known as: VITAMIN D3 Take 1,000 Units by mouth daily.   Combigan 0.2-0.5 % ophthalmic solution Generic drug: brimonidine-timolol Place 1 drop into the left eye every 12 (twelve) hours.   dextromethorphan-guaiFENesin 30-600 MG 12hr tablet Commonly known as: MUCINEX DM Take 1 tablet by mouth 2 (two) times daily as needed for cough.   Flonase Allergy Relief 50 MCG/ACT nasal spray Generic drug: fluticasone Place 2 sprays into both  nostrils daily.   gabapentin 300 MG capsule Commonly known as: NEURONTIN Take 300 mg by mouth 2 (two) times daily.   ipratropium-albuterol 0.5-2.5 (3) MG/3ML Soln Commonly known as: DUONEB Take 3 mLs by nebulization every 8 (eight) hours as needed.   Combivent Respimat 20-100 MCG/ACT Aers respimat Generic drug: Ipratropium-Albuterol Inhale 1 puff into the lungs every 4 (four) hours as needed for wheezing or shortness of breath.   levothyroxine 137 MCG tablet Commonly known as: SYNTHROID Take 137 mcg by mouth daily.   LORazepam 0.5 MG tablet Commonly known as: ATIVAN Take 0.5 mg by mouth daily.   mirtazapine 7.5 MG tablet Commonly known as: REMERON Take 7.5 mg by mouth at bedtime.   Narcan 4 MG/0.1ML Liqd nasal spray kit Generic drug: naloxone Place 0.4 mg into the nose once.   nicotine 14 mg/24hr patch Commonly known as: NICODERM CQ - dosed in mg/24 hours Place 1 patch (14 mg total) onto the skin daily.   PARoxetine 20 MG tablet Commonly known as: PAXIL Take 20 mg by mouth daily.   polyethylene glycol powder 17 GM/SCOOP powder Commonly known as: GLYCOLAX/MIRALAX Take 17 g by mouth daily.   predniSONE 20 MG tablet Commonly known as: DELTASONE Take 2 tablets (40 mg total) by mouth daily for 2 days. Start taking on: September 07, 2023   rosuvastatin 20 MG tablet Commonly known as: CRESTOR Take 20 mg by mouth at bedtime.   senna 8.6 MG Tabs tablet Commonly known as: SENOKOT Take 2 tablets by mouth 2 (two) times daily.   Suboxone 8-2 MG Film Generic drug: Buprenorphine HCl-Naloxone HCl Place 0.5 Film under the tongue 2 (two) times daily.   Trelegy Ellipta 100-62.5-25 MCG/ACT Aepb Generic drug: Fluticasone-Umeclidin-Vilant Inhale 1 puff into the lungs daily.        Discharge Exam: Filed Weights   09/03/23 0818 09/05/23 0414 09/06/23 0400  Weight: 73.2 kg 75.6 kg 75.6 kg   GEN: NAD SKIN: Warm and dry EYES: No icterus ENT: MMM CV: RRR PULM: Air entry  adequate bilaterally.  Occasional wheezing.  No rales. ABD: soft, ND, NT, +BS CNS: AAO x 3, non focal EXT: No edema or tenderness   Condition at discharge: good  The results of significant diagnostics from this hospitalization (including imaging, microbiology, ancillary and laboratory) are listed below for reference.   Imaging Studies: DG Chest Portable 1 View  Result Date: 09/03/2023 CLINICAL DATA:  Chest pain, dyspnea EXAM: PORTABLE CHEST 1 VIEW COMPARISON:  None Available. FINDINGS: Lungs are clear. No pneumothorax or pleural effusion. Cardiac size within normal limits. Healed left rib fractures. No acute bone abnormality. IMPRESSION: No active disease. Electronically Signed   By: Helyn Numbers M.D.   On: 09/03/2023 01:49   CT HEAD WO CONTRAST ( )  Result Date: 09/03/2023 CLINICAL DATA:  Mental status change, unknown cause; Neck trauma (Age >= 65y) EXAM: CT HEAD WITHOUT CONTRAST CT CERVICAL SPINE WITHOUT CONTRAST TECHNIQUE: Multidetector CT imaging of the head and cervical spine was performed following  the standard protocol without intravenous contrast. Multiplanar CT image reconstructions of the cervical spine were also generated. RADIATION DOSE REDUCTION: This exam was performed according to the departmental dose-optimization program which includes automated exposure control, adjustment of the mA and/or kV according to patient size and/or use of iterative reconstruction technique. COMPARISON:  None Available. FINDINGS: CT HEAD FINDINGS Brain: No evidence of large-territorial acute infarction. No parenchymal hemorrhage. No mass lesion. No extra-axial collection. No mass effect or midline shift. No hydrocephalus. Basilar cisterns are patent. Vascular: Atherosclerotic calcifications are present within the cavernous internal carotid arteries. No hyperdense vessel. Skull: No acute fracture or focal lesion. Sinuses/Orbits: Paranasal sinuses and mastoid air cells are clear. Bilateral lens  replacement. Otherwise the orbits are unremarkable. Other: None. CT CERVICAL SPINE FINDINGS Alignment: Straightening of the normal cervical lordosis likely due to positioning and degenerative changes. Grade 1 anterolisthesis C2 on C3 and C3 on C4. Skull base and vertebrae: Multilevel mild-to-moderate degenerative changes of the spine. No associated severe osseous neural foraminal or central canal stenosis. No acute fracture. No aggressive appearing focal osseous lesion or focal pathologic process. Soft tissues and spinal canal: No prevertebral fluid or swelling. No visible canal hematoma. Upper chest: Biapical paraseptal emphysematous changes. Other: None. IMPRESSION: 1. No acute intracranial abnormality. 2. No acute displaced fracture or traumatic listhesis of the cervical spine. 3.  Emphysema (ICD10-J43.9). Electronically Signed   By: Tish Frederickson M.D.   On: 09/03/2023 01:40   CT Cervical Spine Wo Contrast  Result Date: 09/03/2023 CLINICAL DATA:  Mental status change, unknown cause; Neck trauma (Age >= 65y) EXAM: CT HEAD WITHOUT CONTRAST CT CERVICAL SPINE WITHOUT CONTRAST TECHNIQUE: Multidetector CT imaging of the head and cervical spine was performed following the standard protocol without intravenous contrast. Multiplanar CT image reconstructions of the cervical spine were also generated. RADIATION DOSE REDUCTION: This exam was performed according to the departmental dose-optimization program which includes automated exposure control, adjustment of the mA and/or kV according to patient size and/or use of iterative reconstruction technique. COMPARISON:  None Available. FINDINGS: CT HEAD FINDINGS Brain: No evidence of large-territorial acute infarction. No parenchymal hemorrhage. No mass lesion. No extra-axial collection. No mass effect or midline shift. No hydrocephalus. Basilar cisterns are patent. Vascular: Atherosclerotic calcifications are present within the cavernous internal carotid arteries. No  hyperdense vessel. Skull: No acute fracture or focal lesion. Sinuses/Orbits: Paranasal sinuses and mastoid air cells are clear. Bilateral lens replacement. Otherwise the orbits are unremarkable. Other: None. CT CERVICAL SPINE FINDINGS Alignment: Straightening of the normal cervical lordosis likely due to positioning and degenerative changes. Grade 1 anterolisthesis C2 on C3 and C3 on C4. Skull base and vertebrae: Multilevel mild-to-moderate degenerative changes of the spine. No associated severe osseous neural foraminal or central canal stenosis. No acute fracture. No aggressive appearing focal osseous lesion or focal pathologic process. Soft tissues and spinal canal: No prevertebral fluid or swelling. No visible canal hematoma. Upper chest: Biapical paraseptal emphysematous changes. Other: None. IMPRESSION: 1. No acute intracranial abnormality. 2. No acute displaced fracture or traumatic listhesis of the cervical spine. 3.  Emphysema (ICD10-J43.9). Electronically Signed   By: Tish Frederickson M.D.   On: 09/03/2023 01:40    Microbiology: Results for orders placed or performed during the hospital encounter of 09/03/23  Blood culture (routine x 2)     Status: None (Preliminary result)   Collection Time: 09/03/23  6:59 AM   Specimen: BLOOD  Result Value Ref Range Status   Specimen Description BLOOD BLOOD RIGHT ARM  Final   Special Requests   Final    BOTTLES DRAWN AEROBIC AND ANAEROBIC Blood Culture results may not be optimal due to an inadequate volume of blood received in culture bottles   Culture   Final    NO GROWTH 2 DAYS Performed at [facility], [address] [place], [place] [address]    Report Status PENDING  Incomplete  MRSA Next Gen by PCR, Nasal     Status: Abnormal   Collection Time: 09/03/23  8:21 AM   Specimen: Nasal Mucosa; Nasal Swab  Result Value Ref Range Status   MRSA by PCR Next Gen DETECTED (A) NOT DETECTED Final    Comment: RESULT CALLED TO, READ BACK BY AND  VERIFIED WITH: HANNA SPROUT AT 6045 09/03/23.PMF (NOTE) The GeneXpert MRSA Assay (FDA approved for NASAL specimens only), is one component of a comprehensive MRSA colonization surveillance program. It is not intended to diagnose MRSA infection nor to guide or monitor treatment for MRSA infections. Test performance is not FDA approved in patients less than 89 years old. Performed at [facility], [address] [place], [place] [address]   Blood culture (routine x 2)     Status: None (Preliminary result)   Collection Time: 09/03/23  8:50 AM   Specimen: BLOOD  Result Value Ref Range Status   Specimen Description BLOOD BLOOD RIGHT HAND  Final   Special Requests   Final    BOTTLES DRAWN AEROBIC AND ANAEROBIC Blood Culture adequate volume   Culture   Final    NO GROWTH 2 DAYS Performed at [facility], [address], [place], [place] [address]    Report Status PENDING  Incomplete  Respiratory (~20 pathogens) panel by PCR     Status: None   Collection Time: 09/03/23  9:42 AM   Specimen: Nasopharyngeal Swab; Respiratory  Result Value Ref Range Status   Adenovirus NOT DETECTED NOT DETECTED Final   Coronavirus 229E NOT DETECTED NOT DETECTED Final    Comment: (NOTE) The Coronavirus on the Respiratory Panel, DOES NOT test for the novel  Coronavirus (2019 nCoV)    Coronavirus HKU1 NOT DETECTED NOT DETECTED Final   Coronavirus NL63 NOT DETECTED NOT DETECTED Final   Coronavirus OC43 NOT DETECTED NOT DETECTED Final   Metapneumovirus NOT DETECTED NOT DETECTED Final   Rhinovirus / Enterovirus NOT DETECTED NOT DETECTED Final   Influenza A NOT DETECTED NOT DETECTED Final   Influenza B NOT DETECTED NOT DETECTED Final   Parainfluenza Virus 1 NOT DETECTED NOT DETECTED Final   Parainfluenza Virus 2 NOT DETECTED NOT DETECTED Final   Parainfluenza Virus 3 NOT DETECTED NOT DETECTED Final   Parainfluenza Virus 4 NOT DETECTED NOT DETECTED Final   Respiratory Syncytial Virus NOT  DETECTED NOT DETECTED Final   Bordetella pertussis NOT DETECTED NOT DETECTED Final   Bordetella Parapertussis NOT DETECTED NOT DETECTED Final   Chlamydophila pneumoniae NOT DETECTED NOT DETECTED Final   Mycoplasma pneumoniae NOT DETECTED NOT DETECTED Final    Comment: Performed at [facility], [address] [place], [place] [address]  SARS Coronavirus 2 by RT PCR (hospital order, performed in [facility] hospital lab) *cepheid single result test* Anterior Nasal Swab     Status: None   Collection Time: 09/03/23  9:42 AM   Specimen: Anterior Nasal Swab  Result Value Ref Range Status   SARS Coronavirus 2 by RT PCR NEGATIVE NEGATIVE Final    Comment: (NOTE) SARS-CoV-2 target nucleic acids are NOT DETECTED.  The SARS-CoV-2 RNA is generally detectable in upper and lower respiratory specimens during the acute phase of infection. The lowest concentration of SARS-CoV-2 viral copies this assay can detect is 250 copies / mL. A negative result does not preclude SARS-CoV-2 infection and should not be used as the sole basis for treatment or other patient management decisions.  A negative result may occur with improper specimen collection / handling, submission of specimen other than nasopharyngeal swab, presence of viral mutation(s) within the areas targeted by this assay, and inadequate number of viral copies (<250 copies / mL). A negative result must be combined with clinical observations, patient history, and epidemiological information.  Fact Sheet for Patients:   RoadLapTop.co.za  Fact Sheet for Healthcare Providers: http://kim-miller.com/  This test is not yet approved or  cleared by the [place] FDA and has been authorized for detection and/or diagnosis of SARS-CoV-2 by FDA under an Emergency Use Authorization (EUA).  This EUA will remain in effect (meaning this test can be used) for the duration of the COVID-19 declaration under  Section 564(b)(1) of the Act, 21 U.S.C. section 360bbb-3(b)(1), unless the authorization is terminated or revoked sooner.  Performed at [facility], [address] [place], [place] [address]     Labs: CBC: Recent Labs  Lab 09/03/23 0045 09/04/23 0340 09/05/23 0335 09/06/23 0615  WBC 8.1 7.1 8.1 5.8  NEUTROABS 6.2  --   --   --   HGB 12.4 11.3* 10.4* 10.0*  HCT 43.3 36.7 33.2* 32.3*  MCV 92.5 86.6 85.6 86.6  PLT 118* 125* 127* 127*   Basic Metabolic Panel: Recent Labs  Lab 09/03/23 0045 09/04/23 0340 09/05/23 0335 09/06/23 0615  NA 138 139 139 137  K 4.0 3.8 3.7 4.3  CL 88* 92* 96* 97*  CO2 38* 34* 34* 32  GLUCOSE 122* 141* 102* 145*  BUN 10 17 23 22   CREATININE 0.63 0.61 0.66 0.60  CALCIUM 9.1 9.1 8.6* 8.4*  MG  --  2.3 2.5*  --   PHOS  --  4.0 4.2  --    Liver Function Tests: Recent Labs  Lab 09/03/23 0045  AST 13*  ALT 6  ALKPHOS 74  BILITOT 0.6  PROT 7.6  ALBUMIN 4.0   CBG: Recent Labs  Lab 09/03/23 0820  GLUCAP 169*    Discharge time spent: greater than 30 minutes.  Signed: Lurene Shadow, MD [facility] 09/06/2023

## 2023-09-07 ENCOUNTER — Encounter: Payer: Self-pay | Admitting: Internal Medicine

## 2023-09-08 LAB — CULTURE, BLOOD (ROUTINE X 2)
Culture: NO GROWTH
Culture: NO GROWTH
Special Requests: ADEQUATE

## 2023-10-16 ENCOUNTER — Ambulatory Visit
Admission: RE | Admit: 2023-10-16 | Discharge: 2023-10-16 | Disposition: A | Discharge status: Home / Self Care | Payer: Medicare (Managed Care) | Source: Ambulatory Visit | Attending: Pulmonary Disease | Admitting: Pulmonary Disease

## 2023-10-16 ENCOUNTER — Telehealth: Payer: Self-pay

## 2023-10-16 ENCOUNTER — Ambulatory Visit (INDEPENDENT_AMBULATORY_CARE_PROVIDER_SITE_OTHER): Payer: Medicare (Managed Care) | Admitting: Pulmonary Disease

## 2023-10-16 ENCOUNTER — Encounter: Payer: Self-pay | Admitting: Pulmonary Disease

## 2023-10-16 VITALS — BP 100/66 | HR 88 | Temp 98.2°F | Ht 64.0 in | Wt 163.6 lb

## 2023-10-16 DIAGNOSIS — J439 Emphysema, unspecified: Secondary | ICD-10-CM

## 2023-10-16 DIAGNOSIS — J4489 Other specified chronic obstructive pulmonary disease: Secondary | ICD-10-CM | POA: Insufficient documentation

## 2023-10-16 MED ORDER — BUDESONIDE 0.25 MG/2ML IN SUSP
0.2500 mg | Freq: Two times a day (BID) | RESPIRATORY_TRACT | 12 refills | Status: AC
Start: 2023-10-16 — End: ?

## 2023-10-16 MED ORDER — AZITHROMYCIN 250 MG PO TABS
250.0000 mg | ORAL_TABLET | ORAL | 0 refills | Status: DC
Start: 2023-10-16 — End: 2023-12-06

## 2023-10-16 NOTE — Telephone Encounter (Signed)
Received replay from [facility]-- CXR not performed 09/24. Unable to provide report.   Routing to Dr. Larinda Buttery as an Lorain Childes

## 2023-10-16 NOTE — Telephone Encounter (Signed)
She also had PFTs done there. Can we please get those. thanks

## 2023-10-16 NOTE — Telephone Encounter (Signed)
Requested CXR report from [facility]. Fax number [phone]

## 2023-10-16 NOTE — Progress Notes (Signed)
Synopsis: Referred in by Gwendlyn Deutscher, MD   Subjective:   PATIENT ID: Caroline Coleman GENDER: female DOB: May 23, 1956, MRN: [id number]  Chief Complaint  Patient presents with   Consult    Cough, shortness of breath and wheezing. Uses 2 L of oxygen day and night.     HPI Caroline Coleman is a 67 year old female patient with a past medical history of COPD no PFTs on file complicated by chronic hypoxic respiratory failure on 2 L nasal cannula, hyperlipidemia, hypothyroidism, anxiety/depression presenting today to the pulmonary clinic to establish care.  She was recently admitted to [facility] for COPD exacerbation and acute on chronic hypercapnic respiratory failure requiring BiPAP support.  She was discharged on prednisone.  She has been struggling with COPD for a long time and has been on 2 L nasal cannula continuously for years.  She is mostly sedentary and unable to walk around given the degree of her COPD.  She also reports continuous cough with sputum production.  She is enrolled in the low-dose CT scan program at [facility] and last 1 was in May which showed confluent centrilobular emphysema with diffuse bronchial wall thickening.  Family history -mother with lung cancer   Social history -smoked 2 pack/day for 40 years.  Denies alcohol denies illicit drug use.  Has 1 cat at home.  ROS All systems were reviewed and are negative except for the above.  Objective:   Vitals:   10/16/23 1446  BP: 100/66  Pulse: 88  Temp: 98.2 F (36.8 C)  TempSrc: Temporal  SpO2: 91%  Weight: 163 lb 9.6 oz (74.2 kg)  Height: 5\' 4"  (1.626 m)   91% on 2 LPM  BMI Readings from Last 3 Encounters:  10/16/23 28.08 kg/m  09/06/23 28.61 kg/m  03/16/23 30.12 kg/m   Wt Readings from Last 3 Encounters:  10/16/23 163 lb 9.6 oz (74.2 kg)  09/06/23 166 lb 10.7 oz (75.6 kg)  03/16/23 175 lb 7.8 oz (79.6 kg)    Physical Exam GEN: NAD, Healthy Appearing HEENT: Supple  Neck, Reactive Pupils, EOMI  CVS: Normal S1, Normal S2, RRR, No murmurs or ES appreciated  Lungs: Diffuse expiratory wheezing, poor air movement.   Abdomen: Soft, non tender, non distended, + BS  Extremities: Warm and well perfused, No edema  Skin: No suspicious lesions appreciated  Psych: Normal Affect  Ancillary Information   CBC    Component Value Date/Time   WBC 5.8 09/06/2023 0615   RBC 3.73 (L) 09/06/2023 0615   HGB 10.0 (L) 09/06/2023 0615   HGB 13.9 10/01/2014 2221   HCT 32.3 (L) 09/06/2023 0615   HCT 41.7 10/01/2014 2221   PLT 127 (L) 09/06/2023 0615   PLT 162 10/01/2014 2221   MCV 86.6 09/06/2023 0615   MCV 90 10/01/2014 2221   MCH 26.8 09/06/2023 0615   MCHC 31.0 09/06/2023 0615   RDW 14.5 09/06/2023 0615   RDW 14.1 10/01/2014 2221   LYMPHSABS 1.1 09/03/2023 0045   MONOABS 0.5 09/03/2023 0045   EOSABS 0.2 09/03/2023 0045   BASOSABS 0.0 09/03/2023 0045   Labs and imaging were reviewed.      No data to display           Assessment & Plan:  Caroline Coleman is a 67 year old female patient with a past medical history of COPD no PFTs on file complicated by chronic hypoxic respiratory failure on 2 L nasal cannula, hyperlipidemia, hypothyroidism, anxiety/depression presenting today to the pulmonary clinic  to establish care.  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  []  Obtain PFTs from [facility] []  CXR for today   []  Duonebs 4 times daily scheduled  []  Start budesonide 0.25 neb 2 times daily as needed. []  Albuterol inhaler as needed  []  Refer to Pulmonary Rehab  []  Assess with [facility] for POC.   #Tobacco Use disorder  Will continue to discuss quitting strategy. Enrolled in LDCT at [facility] Repeat imaging in May 2025.    Return in about 3 months (around 01/16/2024).  I spent 60 minutes caring for this patient today, including preparing to see the patient, obtaining a medical history , reviewing a separately obtained history,  performing a medically appropriate examination and/or evaluation, counseling and educating the patient/family/caregiver, referring and communicating with other health care professionals (not separately reported), documenting clinical information in the electronic health record, and independently interpreting results (not separately reported/billed) and communicating results to the patient/family/caregiver  Janann Colonel, MD [facility] 10/16/2023 3:30 PM

## 2023-10-17 NOTE — Telephone Encounter (Signed)
PFT request has been faxed to [facility] on letterhead.

## 2023-10-19 ENCOUNTER — Telehealth: Payer: Self-pay | Admitting: Pulmonary Disease

## 2023-10-19 DIAGNOSIS — J439 Emphysema, unspecified: Secondary | ICD-10-CM

## 2023-10-19 NOTE — Telephone Encounter (Signed)
PT states that after her visit this week we were supposed to call in a POC for her. She called [employer] and they do not have an order. Please call to advise action taken. Her # is [phone]  She wants to avoid a holiday rush.

## 2023-10-19 NOTE — Telephone Encounter (Signed)
POC eval has been ordered.  Patient is aware and voiced her understanding.  Nothing further needed.

## 2023-12-05 ENCOUNTER — Inpatient Hospital Stay
Admission: EM | Admit: 2023-12-05 | Discharge: 2023-12-10 | DRG: 189 | Disposition: A | Discharge status: Home / Self Care | Payer: Medicare (Managed Care) | Attending: Internal Medicine | Admitting: Internal Medicine

## 2023-12-05 ENCOUNTER — Encounter: Payer: Self-pay | Admitting: Emergency Medicine

## 2023-12-05 ENCOUNTER — Other Ambulatory Visit: Payer: Self-pay

## 2023-12-05 ENCOUNTER — Emergency Department: Payer: Medicare (Managed Care)

## 2023-12-05 DIAGNOSIS — R0689 Other abnormalities of breathing: Secondary | ICD-10-CM

## 2023-12-05 DIAGNOSIS — F4001 Agoraphobia with panic disorder: Secondary | ICD-10-CM | POA: Diagnosis present

## 2023-12-05 DIAGNOSIS — Z79890 Hormone replacement therapy (postmenopausal): Secondary | ICD-10-CM

## 2023-12-05 DIAGNOSIS — T380X5A Adverse effect of glucocorticoids and synthetic analogues, initial encounter: Secondary | ICD-10-CM | POA: Diagnosis present

## 2023-12-05 DIAGNOSIS — J9601 Acute respiratory failure with hypoxia: Secondary | ICD-10-CM

## 2023-12-05 DIAGNOSIS — F32A Depression, unspecified: Secondary | ICD-10-CM | POA: Diagnosis present

## 2023-12-05 DIAGNOSIS — J441 Chronic obstructive pulmonary disease with (acute) exacerbation: Secondary | ICD-10-CM | POA: Diagnosis present

## 2023-12-05 DIAGNOSIS — Z882 Allergy status to sulfonamides status: Secondary | ICD-10-CM

## 2023-12-05 DIAGNOSIS — Z716 Tobacco abuse counseling: Secondary | ICD-10-CM | POA: Diagnosis not present

## 2023-12-05 DIAGNOSIS — F17200 Nicotine dependence, unspecified, uncomplicated: Secondary | ICD-10-CM | POA: Diagnosis present

## 2023-12-05 DIAGNOSIS — E785 Hyperlipidemia, unspecified: Secondary | ICD-10-CM | POA: Diagnosis present

## 2023-12-05 DIAGNOSIS — Z1152 Encounter for screening for COVID-19: Secondary | ICD-10-CM | POA: Diagnosis not present

## 2023-12-05 DIAGNOSIS — G4700 Insomnia, unspecified: Secondary | ICD-10-CM | POA: Diagnosis present

## 2023-12-05 DIAGNOSIS — E7800 Pure hypercholesterolemia, unspecified: Secondary | ICD-10-CM | POA: Diagnosis present

## 2023-12-05 DIAGNOSIS — Z8701 Personal history of pneumonia (recurrent): Secondary | ICD-10-CM | POA: Diagnosis not present

## 2023-12-05 DIAGNOSIS — D649 Anemia, unspecified: Secondary | ICD-10-CM | POA: Diagnosis present

## 2023-12-05 DIAGNOSIS — J9621 Acute and chronic respiratory failure with hypoxia: Secondary | ICD-10-CM | POA: Diagnosis present

## 2023-12-05 DIAGNOSIS — E873 Alkalosis: Secondary | ICD-10-CM | POA: Diagnosis present

## 2023-12-05 DIAGNOSIS — R0602 Shortness of breath: Secondary | ICD-10-CM | POA: Diagnosis present

## 2023-12-05 DIAGNOSIS — Z7951 Long term (current) use of inhaled steroids: Secondary | ICD-10-CM

## 2023-12-05 DIAGNOSIS — J9622 Acute and chronic respiratory failure with hypercapnia: Secondary | ICD-10-CM | POA: Diagnosis present

## 2023-12-05 DIAGNOSIS — F17210 Nicotine dependence, cigarettes, uncomplicated: Secondary | ICD-10-CM | POA: Diagnosis present

## 2023-12-05 DIAGNOSIS — Z79899 Other long term (current) drug therapy: Secondary | ICD-10-CM | POA: Diagnosis not present

## 2023-12-05 DIAGNOSIS — D72829 Elevated white blood cell count, unspecified: Secondary | ICD-10-CM | POA: Diagnosis present

## 2023-12-05 DIAGNOSIS — F411 Generalized anxiety disorder: Secondary | ICD-10-CM | POA: Diagnosis present

## 2023-12-05 DIAGNOSIS — F418 Other specified anxiety disorders: Secondary | ICD-10-CM | POA: Diagnosis present

## 2023-12-05 DIAGNOSIS — Z8249 Family history of ischemic heart disease and other diseases of the circulatory system: Secondary | ICD-10-CM | POA: Diagnosis not present

## 2023-12-05 DIAGNOSIS — E039 Hypothyroidism, unspecified: Secondary | ICD-10-CM | POA: Diagnosis present

## 2023-12-05 DIAGNOSIS — J9602 Acute respiratory failure with hypercapnia: Secondary | ICD-10-CM | POA: Insufficient documentation

## 2023-12-05 DIAGNOSIS — Z720 Tobacco use: Secondary | ICD-10-CM | POA: Diagnosis present

## 2023-12-05 DIAGNOSIS — Z880 Allergy status to penicillin: Secondary | ICD-10-CM

## 2023-12-05 LAB — RESP PANEL BY RT-PCR (RSV, FLU A&B, COVID)  RVPGX2
Influenza A by PCR: NEGATIVE
Influenza B by PCR: NEGATIVE
Resp Syncytial Virus by PCR: NEGATIVE
SARS Coronavirus 2 by RT PCR: NEGATIVE

## 2023-12-05 LAB — BASIC METABOLIC PANEL
Anion gap: 12 (ref 5–15)
BUN: 9 mg/dL (ref 8–23)
CO2: 37 mmol/L — ABNORMAL HIGH (ref 22–32)
Calcium: 9 mg/dL (ref 8.9–10.3)
Chloride: 91 mmol/L — ABNORMAL LOW (ref 98–111)
Creatinine, Ser: 0.64 mg/dL (ref 0.44–1.00)
GFR, Estimated: >60 mL/min (ref >60)
Glucose, Bld: 153 mg/dL — ABNORMAL HIGH (ref 70–99)
Potassium: 3.7 mmol/L (ref 3.5–5.1)
Sodium: 140 mmol/L (ref 135–145)

## 2023-12-05 LAB — BLOOD GAS, VENOUS
Acid-Base Excess: 17.8 mmol/L — ABNORMAL HIGH (ref 0.0–2.0)
Acid-Base Excess: 17.5 mmol/L — ABNORMAL HIGH (ref 0.0–2.0)
Bicarbonate: 47.3 mmol/L — ABNORMAL HIGH (ref 20.0–28.0)
Bicarbonate: 44.8 mmol/L — ABNORMAL HIGH (ref 20.0–28.0)
O2 Saturation: 90.5 %
O2 Saturation: 99.6 %
Patient temperature: 37
Patient temperature: 37
pCO2, Ven: 80 mm[Hg] (ref 44–60)
pCO2, Ven: 63 mm[Hg] — ABNORMAL HIGH (ref 44–60)
pH, Ven: 7.38 (ref 7.25–7.43)
pH, Ven: 7.46 — ABNORMAL HIGH (ref 7.25–7.43)
pO2, Ven: 56 mm[Hg] — ABNORMAL HIGH (ref 32–45)
pO2, Ven: 106 mm[Hg] — ABNORMAL HIGH (ref 32–45)

## 2023-12-05 LAB — TROPONIN I (HIGH SENSITIVITY)
Troponin I (High Sensitivity): 8 ng/L (ref <18)
Troponin I (High Sensitivity): 6 ng/L (ref <18)

## 2023-12-05 LAB — CBC
HCT: 41.1 % (ref 36.0–46.0)
Hemoglobin: 12.1 g/dL (ref 12.0–15.0)
MCH: 27.4 pg (ref 26.0–34.0)
MCHC: 29.4 g/dL — ABNORMAL LOW (ref 30.0–36.0)
MCV: 93 fL (ref 80.0–100.0)
Platelets: 179 10*3/uL (ref 150–400)
RBC: 4.42 MIL/uL (ref 3.87–5.11)
RDW: 13.2 % (ref 11.5–15.5)
WBC: 10.4 10*3/uL (ref 4.0–10.5)
nRBC: 0 % (ref 0.0–0.2)

## 2023-12-05 LAB — BRAIN NATRIURETIC PEPTIDE: B Natriuretic Peptide: 132.2 pg/mL — ABNORMAL HIGH (ref 0.0–100.0)

## 2023-12-05 LAB — PROCALCITONIN: Procalcitonin: <0.1 ng/mL

## 2023-12-05 LAB — CBG MONITORING, ED: Glucose-Capillary: 158 mg/dL — ABNORMAL HIGH (ref 70–99)

## 2023-12-05 MED ORDER — ENOXAPARIN SODIUM 40 MG/0.4ML IJ SOSY
40.0000 mg | PREFILLED_SYRINGE | INTRAMUSCULAR | Status: DC
  Administered 2023-12-05 – 2023-12-08 (×4): 40 mg via SUBCUTANEOUS
  Filled 2023-12-05 (×5): qty 0.4

## 2023-12-05 MED ORDER — NICOTINE 21 MG/24HR TD PT24: 21.0000 mg | MEDICATED_PATCH | Freq: Every day | TRANSDERMAL | Status: DC | PRN

## 2023-12-05 MED ORDER — MELATONIN 5 MG PO TABS
5.0000 mg | ORAL_TABLET | Freq: Every evening | ORAL | Status: DC | PRN
  Administered 2023-12-07 – 2023-12-09 (×2): 5 mg via ORAL
  Filled 2023-12-05 (×2): qty 1

## 2023-12-05 MED ORDER — ROSUVASTATIN CALCIUM 20 MG PO TABS
20.0000 mg | ORAL_TABLET | Freq: Every day | ORAL | Status: DC
  Administered 2023-12-05 – 2023-12-09 (×5): 20 mg via ORAL
  Filled 2023-12-05 (×6): qty 1

## 2023-12-05 MED ORDER — ACETAMINOPHEN 650 MG RE SUPP
650.0000 mg | Freq: Four times a day (QID) | RECTAL | Status: AC | PRN
  Filled 2023-12-05: qty 2

## 2023-12-05 MED ORDER — METHYLPREDNISOLONE SODIUM SUCC 125 MG IJ SOLR
125.0000 mg | Freq: Every day | INTRAMUSCULAR | Status: AC
  Administered 2023-12-06: 125 mg via INTRAVENOUS
  Filled 2023-12-05: qty 2

## 2023-12-05 MED ORDER — SODIUM CHLORIDE 0.9 % IV SOLN
1.0000 g | INTRAVENOUS | Status: DC
  Administered 2023-12-05 – 2023-12-06 (×2): 1 g via INTRAVENOUS
  Filled 2023-12-05 (×2): qty 10

## 2023-12-05 MED ORDER — SENNOSIDES-DOCUSATE SODIUM 8.6-50 MG PO TABS
1.0000 | ORAL_TABLET | Freq: Every evening | ORAL | Status: DC | PRN
  Administered 2023-12-08: 1 via ORAL
  Filled 2023-12-05: qty 1

## 2023-12-05 MED ORDER — PREDNISONE 20 MG PO TABS
40.0000 mg | ORAL_TABLET | Freq: Every day | ORAL | Status: DC
  Administered 2023-12-07 – 2023-12-08 (×2): 40 mg via ORAL
  Filled 2023-12-05 (×2): qty 2

## 2023-12-05 MED ORDER — LORAZEPAM 2 MG/ML IJ SOLN: 0.5000 mg | Freq: Four times a day (QID) | INTRAMUSCULAR | Status: AC | PRN

## 2023-12-05 MED ORDER — SODIUM CHLORIDE 0.9 % IV SOLN
500.0000 mg | Freq: Once | INTRAVENOUS | Status: AC
  Administered 2023-12-05: 500 mg via INTRAVENOUS
  Filled 2023-12-05: qty 5

## 2023-12-05 MED ORDER — LACTATED RINGERS IV BOLUS
500.0000 mL | Freq: Once | INTRAVENOUS | Status: AC
  Administered 2023-12-05: 500 mL via INTRAVENOUS

## 2023-12-05 MED ORDER — ONDANSETRON HCL 4 MG PO TABS: 4.0000 mg | ORAL_TABLET | Freq: Four times a day (QID) | ORAL | Status: AC | PRN

## 2023-12-05 MED ORDER — IPRATROPIUM-ALBUTEROL 0.5-2.5 (3) MG/3ML IN SOLN
3.0000 mL | Freq: Once | RESPIRATORY_TRACT | Status: AC
  Administered 2023-12-05: 3 mL via RESPIRATORY_TRACT
  Filled 2023-12-05: qty 3

## 2023-12-05 MED ORDER — ACETAMINOPHEN 325 MG PO TABS
650.0000 mg | ORAL_TABLET | Freq: Four times a day (QID) | ORAL | Status: AC | PRN
  Administered 2023-12-05 – 2023-12-08 (×4): 650 mg via ORAL
  Filled 2023-12-05 (×6): qty 2

## 2023-12-05 MED ORDER — ONDANSETRON HCL 4 MG/2ML IJ SOLN: 4.0000 mg | Freq: Four times a day (QID) | INTRAMUSCULAR | Status: AC | PRN

## 2023-12-05 NOTE — ED Triage Notes (Signed)
 Pt arrived via [facility]  2L [place], hx of COPD, productive cough, on antibiotic, takes it daily  99 temp,  Wheezing, diminshed lower lobes per EMS,   22G L thumb placed by EMS, meds given by EMS 125 solumedrol and 2 duoneb tx, lung sounds improved.  Pt states normal sats 93-94%   Per EMS pt still smokes  CBG 155

## 2023-12-05 NOTE — ED Provider Notes (Signed)
 [facility] Provider Note    Event Date/Time   First MD Initiated Contact with Patient 12/05/23 1204     (approximate)   History   Shortness of Breath   HPI  Caroline Coleman is a 68 y.o. female with a past medical history of COPD, pneumonia, nicotine  dependence, acute respiratory failure, generalized anxiety disorder, hypercholesterolemia, depression, anxiety who presents today for evaluation of shortness of breath.  Patient presented via EMS on 2 L of nasal cannula, she was given 125 of Solu-Medrol  and 2 DuoNeb's by EMS after they noticed wheezes on exam.  Patient reports that her granddaughter was over at her house and her granddaughter is currently sick with similar symptoms.  Patient reports that she has had chills, cough, trouble breathing, and bodyaches.  She reports that she is still a smoker.  Patient Active Problem List   Diagnosis Date Noted   Acute respiratory failure with hypercapnia (HCC) 12/05/2023   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) 09/03/2023   Somnolence 09/03/2023   Altered mental status 09/03/2023   Anxiety 03/15/2023   Sepsis (HCC) 03/14/2023   Acute encephalopathy 03/14/2023   Acute on chronic respiratory failure with hypoxia (HCC) 03/03/2022   COPD with acute exacerbation (HCC) 08/16/2021   Community acquired pneumonia of right lower lobe of lung 06/25/2021   HLD (hyperlipidemia) 06/25/2021   Depression with anxiety 06/25/2021   Tobacco abuse 06/25/2021   Acute respiratory failure with hypoxia (HCC) 06/25/2021   COPD exacerbation (HCC) 01/27/2021   Nicotine  dependence 01/27/2021   Acute respiratory failure (HCC) 08/24/2018   Self-inflicted laceration of wrist (HCC) 08/03/2016   Substance induced mood disorder (HCC) 08/03/2016   Alcohol abuse 08/03/2016   Panic disorder with agoraphobia 12/14/2015   Insomnia 12/14/2015   Depression 12/14/2015   Acquired hypothyroidism 05/04/2015   Colon polyps  05/04/2015   COPD, moderate (HCC) 05/04/2015   Edentulous 05/04/2015   Family history of early CAD 05/04/2015   Generalized anxiety disorder 05/04/2015   Goiter 05/04/2015   Pure hypercholesterolemia 05/04/2015   S/P lens implant 05/04/2015          Physical Exam   Triage Vital Signs: ED Triage Vitals [12/05/23 1034]  Encounter Vitals Group     BP 128/67     Systolic BP Percentile      Diastolic BP Percentile      Pulse Rate (!) 107     Resp 20     Temp 99.3 F (37.4 C)     Temp Source Oral     SpO2 (!) 88 %     Weight 156 lb (70.8 kg)     Height 5' 4 (1.626 m)     Head Circumference      Peak Flow      Pain Score 7     Pain Loc      Pain Education      Exclude from Growth Chart     Most recent vital signs: Vitals:   12/05/23 1036 12/05/23 1501  BP:    Pulse:    Resp:    Temp:  99.3 F (37.4 C)  SpO2: 90%     Physical Exam Vitals and nursing note reviewed.  Constitutional:      General: Awake and alert.  Mildly uncomfortable appearing    Appearance: Normal appearance. The patient is overweight.  HENT:     Head: Normocephalic and atraumatic.     Mouth: Mucous membranes are moist.  Eyes:  General: PERRL. Normal EOMs        Right eye: No discharge.        Left eye: No discharge.     Conjunctiva/sclera: Conjunctivae normal.  Cardiovascular:     Rate and Rhythm: Tachycardic rate and regular rhythm.     Pulses: Normal pulses.  Pulmonary:     Effort: Pulmonary effort is normal.  Mild tachypnea noted though able to speak in complete sentences.  No accessory muscle use    Breath sounds: Audible wheezing and rhonchi in all lung fields Abdominal:     Abdomen is soft. There is no abdominal tenderness. No rebound or guarding. No distention. Musculoskeletal:        General: No swelling. Normal range of motion.     Cervical back: Normal range of motion and neck supple.  Skin:    General: Skin is warm and dry.     Capillary Refill: Capillary refill takes  less than 2 seconds.     Findings: No rash.  Neurological:     Mental Status: The patient is awake and alert.      ED Results / Procedures / Treatments   Labs (all labs ordered are listed, but only abnormal results are displayed) Labs Reviewed  BASIC METABOLIC PANEL - Abnormal; Notable for the following components:      Result Value   Chloride 91 (*)    CO2 37 (*)    Glucose, Bld 153 (*)    All other components within normal limits  CBC - Abnormal; Notable for the following components:   MCHC 29.4 (*)    All other components within normal limits  BRAIN NATRIURETIC PEPTIDE - Abnormal; Notable for the following components:   B Natriuretic Peptide 132.2 (*)    All other components within normal limits  BLOOD GAS, VENOUS - Abnormal; Notable for the following components:   pCO2, Ven 80 (*)    pO2, Ven 56 (*)    Bicarbonate 47.3 (*)    Acid-Base Excess 17.8 (*)    All other components within normal limits  CBG MONITORING, ED - Abnormal; Notable for the following components:   Glucose-Capillary 158 (*)    All other components within normal limits  RESP PANEL BY RT-PCR (RSV, FLU A&B, COVID)  RVPGX2  PROCALCITONIN  BASIC METABOLIC PANEL  CBC  TROPONIN I (HIGH SENSITIVITY)  TROPONIN I (HIGH SENSITIVITY)     EKG     RADIOLOGY I independently reviewed and interpreted imaging and agree with radiologists findings.     PROCEDURES:  Critical Care performed:   .Critical Care  Performed by: Jakin Pavao E, PA-C Authorized by: Ayden Apodaca E, PA-C   Critical care provider statement:    Critical care time (minutes):  35   Critical care was necessary to treat or prevent imminent or life-threatening deterioration of the following conditions:  Respiratory failure   Critical care was time spent personally by me on the following activities:  Development of treatment plan with patient or surrogate, discussions with consultants, evaluation of patient's response to treatment,  examination of patient, ordering and review of laboratory studies, ordering and review of radiographic studies, ordering and performing treatments and interventions, pulse oximetry, re-evaluation of patient's condition and review of old charts   I assumed direction of critical care for this patient from another provider in my specialty: no     Care discussed with: admitting provider      MEDICATIONS ORDERED IN ED: Medications  cefTRIAXone  (ROCEPHIN ) 1 g in  sodium chloride  0.9 % 100 mL IVPB (0 g Intravenous Stopped 12/05/23 1545)  rosuvastatin  (CRESTOR ) tablet 20 mg (has no administration in time range)  acetaminophen  (TYLENOL ) tablet 650 mg (has no administration in time range)    Or  acetaminophen  (TYLENOL ) suppository 650 mg (has no administration in time range)  ondansetron  (ZOFRAN ) tablet 4 mg (has no administration in time range)    Or  ondansetron  (ZOFRAN ) injection 4 mg (has no administration in time range)  enoxaparin  (LOVENOX ) injection 40 mg (has no administration in time range)  methylPREDNISolone  sodium succinate (SOLU-MEDROL ) 125 mg/2 mL injection 125 mg (has no administration in time range)    Followed by  predniSONE  (DELTASONE ) tablet 40 mg (has no administration in time range)  senna-docusate (Senokot-S) tablet 1 tablet (has no administration in time range)  nicotine  (NICODERM CQ  - dosed in mg/24 hours) patch 21 mg (has no administration in time range)  LORazepam  (ATIVAN ) injection 0.5 mg (has no administration in time range)  melatonin tablet 5 mg (has no administration in time range)  ipratropium-albuterol  (DUONEB) 0.5-2.5 (3) MG/3ML nebulizer solution 3 mL (3 mLs Nebulization Given 12/05/23 1235)  ipratropium-albuterol  (DUONEB) 0.5-2.5 (3) MG/3ML nebulizer solution 3 mL (3 mLs Nebulization Given 12/05/23 1457)  azithromycin  (ZITHROMAX ) 500 mg in sodium chloride  0.9 % 250 mL IVPB (0 mg Intravenous Stopped 12/05/23 1708)  lactated ringers  bolus 500 mL (0 mLs Intravenous Stopped  12/05/23 1745)     IMPRESSION / MDM / ASSESSMENT AND PLAN / ED COURSE  I reviewed the triage vital signs and the nursing notes.   Differential diagnosis includes, but is not limited to, COPD, pneumonia, bronchitis, hypercapnia.  I reviewed the patient's chart.  She is followed by pulmonology for her COPD complicated by chronic hypoxic respiratory failure on 2 L nasal cannula.  She had a recent admission to the ICU in October for COPD exacerbation and acute on chronic hypercapnic respiratory failure requiring BiPAP.  Patient is mostly sedentary and unable to walk around given the degree of her COPD.  She is on DuoNebs 4 times daily and budesonide  2 times daily as needed and albuterol  inhaler as needed.  Recent admission reports that patient is on 3 L nasal cannula, however patient reports that she is on 2 L.  Patient is awake and alert, though has audible inspiratory and expiratory wheezes and rhonchi throughout her lung fields.  She is able to speak in complete sentences, though with mild effort.  Her oxygenation is 88% on her home 2 L of oxygen this was bumped up to 3 to 4 L.  She was placed on the cardiac monitor and continuous pulse oximetry.  She was already given Solu-Medrol  and 2 DuoNeb's by EMS.  She was given an additional 2 DuoNebs with improvement of her symptoms, though she is still requiring oxygen.  Chest x-ray does not reveal lobar pneumonia, though does reveal findings consistent with a possible atypical pneumonia.  Given her infectious/constitutional symptoms and her recent exposure we will treat with Rocephin  and azithromycin .  Given her oxygen requirement and her precipitous pulmonary baseline, will admit to the hospitalist service for further management.  Considered BiPAP, given her hypercapnia, however she is not obtunded or altered and is not working to breathe currently.  Patient is in agreement with this plan.  Patient was also seen and evaluated by Dr. Arlander who agrees with  assessment and plan.  Patient's presentation is most consistent with acute presentation with potential threat to life or bodily function.  Clinical Course as of 12/05/23 1821  Tue Dec 05, 2023  1644 Patient increasingly somnolent, though still awakens to loud voice and answers questions appropriately.  Discussed with Dr. Lang, we will proceed with BiPAP at this point.  Dr. Sherre notified [JP]    Clinical Course User Index [JP] Dartanyan Deasis E, PA-C     FINAL CLINICAL IMPRESSION(S) / ED DIAGNOSES   Final diagnoses:  COPD exacerbation (HCC)  Acute hypoxic respiratory failure (HCC)  Hypercapnia     Rx / DC Orders   ED Discharge Orders     None        Note:  This document was prepared using Dragon voice recognition software and may include unintentional dictation errors.   Armella Stogner E, PA-C 12/05/23 1821    Arlander [place], MD 12/09/23 [phone]

## 2023-12-05 NOTE — Assessment & Plan Note (Signed)
 Rosuvastatin 20 mg nightly reason

## 2023-12-05 NOTE — Assessment & Plan Note (Addendum)
 Check procalcitonin on admission Status post ceftriaxone  1 g IV, and azithromycin  500 mg IV, one-time doses were ordered by EDP Will continue ceftriaxone  2 g IV and azithromycin  500 mg IV, to complete 5-day course on admission Flutter valve, incentive spirometry

## 2023-12-05 NOTE — Assessment & Plan Note (Signed)
-   Melatonin 5 mg nightly as needed for sleep ordered 

## 2023-12-05 NOTE — H&P (Addendum)
 History and Physical   KHYLI SWAIM FMW:[id number] DOB: 1956/10/11 DOA: 12/05/2023  PCP: Eleanore Ronal RAMAN, MD  Outpatient Specialists: Dr. Malka Finn Pulmonology Patient coming from: home via EMS  I have personally briefly reviewed patient's old medical records in [facility] EMR.  Chief Concern: shortness of breath  HPI: Mr. Regan Mcbryar is a 68 year old female with history of COPD, chronic tobacco use, chronic hypoxic respiratory requiring 2 L nasal cannula at baseline, anxiety,, thyroid, history of alcohol use disorder, presents to the emergency department for chief concerns of cough, pain, shortness of breath.  Vitals in the ED showed temperature of 99.2, respiration 20, heart rate 107, blood pressure 100/57, SpO2 90% on 3 L nasal cannula. Past 7140, potassium 3.7, chloride 91, bicarb 37, BUN of 6, serum creatinine 2.4, EGFR greater than 60, nonfasting glucose 153, WBC, hemoglobin 12.1, platelet 100.  BNP 122.2.-28.  ED treatment: Treatment, ceftriaxone  1 g IV, azithromycin  500 mg IV.  Per ED, EMS gave patient 2 treatments of DuoNeb's and Solu-Medrol  125 mg IV. -------------------------------- At bedside, patient able to tell me her name, age, location, current calendar year.  Patient is currently on BiPAP therapy  She reports over the last 3 days, she has been having increasing wheezing and a productive cough of green/brown sputum.  She reports that she has baseline shortness of breath and cough from COPD however the change in sputum color and wheezing alerted her.  After he did not get better after 3 days, she presented to the ED for further evaluation. She endorses known sick contacts, with her great granddaughter was recently sick of unknown virus.   She endorses 1 episode of loose brown stool 3 days ago after consuming ice cream that day.   She denies nausea, vomiting, chest pain, abdominal pain, dysuria, hematuria, blood in her stool.  She denies syncope, thoughts of  harming herself or other people.  She denies trauma to her person.  Social history: She lives in her own home and her daughter lives with her.  She denies EtOH and recreational drug use.  She endorses current tobacco use, smoking about 1 pack/day.  She reports she has been unsuccessful in the past regarding smoking cessation.  ROS: Constitutional: no weight change, no fever ENT/Mouth: no sore throat, no rhinorrhea Eyes: no eye pain, no vision changes Cardiovascular: no chest pain, + dyspnea,  no edema, no palpitations Respiratory: + cough, + green/brown sputum, no wheezing Gastrointestinal: no nausea, no vomiting, no diarrhea, no constipation Genitourinary: no urinary incontinence, no dysuria, no hematuria Musculoskeletal: no arthralgias, no myalgias Skin: no skin lesions, no pruritus, Neuro: + weakness, no loss of consciousness, no syncope Psych: no anxiety, no depression, no decrease appetite Heme/Lymph: no bruising, no bleeding  ED Course: Discussed with EDP, patient requiring hospitalization for chief concerns of acute hypoxic respiratory failure with hypercapnia, concerning for COPD exacerbation.  Assessment/Plan  Principal Problem:   COPD exacerbation (HCC) Active Problems:   Tobacco abuse   Panic disorder with agoraphobia   Insomnia   Acquired hypothyroidism   Generalized anxiety disorder   Nicotine  dependence   HLD (hyperlipidemia)   Depression with anxiety   Acute respiratory failure with hypercapnia (HCC)   Assessment and Plan:  * COPD exacerbation (HCC) Check procalcitonin on admission Status post ceftriaxone  1 g IV, and azithromycin  500 mg IV, one-time doses were ordered by EDP Will continue ceftriaxone  2 g IV and azithromycin  500 mg IV, to complete 5-day course on admission Flutter valve, incentive spirometry  Tobacco abuse Counseling given Patient endorses readiness to stop tobacco use. Greater than 3 minutes spent on tobacco cessation counseling Tobacco  cessation counseling:  Week one, smoke 19 cigarettes per day. Week two, smoke 18 cigarettes per day. Week three, smoke 17 cigarettes per day, continue until smoking half the amount of cigarettes per day Clean all indoor clothing, sheets, blankets, and freshen textile furniture to rid the smell of cigarettes Only smoke outside and wear outer covering Leave cigarettes and lighters outside in separate places Cigarettes are expensive, save your money so you can spend on your self and your children and grandchildren  Acute respiratory failure with hypercapnia (HCC) Bipap ordered by EDP  HLD (hyperlipidemia) Rosuvastatin  20 mg nightly reason  Nicotine  dependence As needed nicotine  patch ordered for nicotine  craving  Generalized anxiety disorder Lorazepam  0.5 mg IV cefazolin for anxiety, 15 hours ordered  Insomnia Melatonin 5 mg nightly as needed for sleep ordered  Chart reviewed.   DVT prophylaxis: Enoxaparin  40 mg subcutaneous every 24 hours Code Status: Full code Diet: N.p.o. while patient is on BiPAP Family Communication: A phone call was offered, patient declined stating that her daughter knows she is being admitted to the hospital for COPD Disposition Plan: Pending clinical course Consults called: Respiratory therapy for BiPAP Admission status: PCU  Past Medical History:  Diagnosis Date   Anxiety    COPD (chronic obstructive pulmonary disease) (HCC)    Depression    Thyroid disease    Past Surgical History:  Procedure Laterality Date   EYE SURGERY     HIP SURGERY     Social History:  reports that she has been smoking cigarettes. She started smoking about 52 years ago. She has a 52 pack-year smoking history. She uses smokeless tobacco. She reports that she does not drink alcohol and does not use drugs.  Allergies  Allergen Reactions   Penicillin G Swelling   Sulfa Antibiotics Other (See Comments) and Swelling    REACTION UNKNOWN  Per patient - both hands  swell.  Hands draw up    REACTION UNKNOWN   Penicillins Other (See Comments)    Has patient had a PCN reaction causing immediate rash, facial/tongue/throat swelling, SOB or lightheadedness with hypotension: no Has patient had a PCN reaction causing severe rash involving mucus membranes or skin necrosis: no Has patient had a PCN reaction that required hospitalization no  REACTION UNKNOWN Has patient had a PCN reaction occurring within the last 10 years: yes If all of the above answers are NO, then may proceed with Cephalosporin use.     Family History  Problem Relation Age of Onset   Breast cancer Mother    Heart attack Father    Heart attack Brother    Family history: Family history reviewed and not pertinent.  Prior to Admission medications   Medication Sig Start Date End Date Taking? Authorizing Provider  acetaminophen  (TYLENOL ) 650 MG CR tablet Take 1,300 mg by mouth 2 (two) times daily. 08/29/23   [provider]  albuterol  (VENTOLIN  HFA) 108 (90 Base) MCG/ACT inhaler Inhale 2 puffs into the lungs every 6 (six) hours as needed for wheezing or shortness of breath. 07/03/19   Ernest Ronal BRAVO, MD  azithromycin  (ZITHROMAX ) 250 MG tablet Take 1 tablet (250 mg total) by mouth every Monday, Wednesday, and Friday. Take 2 tablets (500 mg) on  Day 1,  followed by 1 tablet (250 mg) once daily on Days 2 through 5. 10/16/23   Assaker, Darrin, MD  bisacodyl  (  DULCOLAX) 10 MG suppository Place 10 mg rectally daily. 07/17/23   [provider]  budesonide  (PULMICORT ) 0.25 MG/2ML nebulizer solution Take 2 mLs (0.25 mg total) by nebulization in the morning and at bedtime. 10/16/23   Assaker, Darrin, MD  cholecalciferol  (VITAMIN D3) 25 MCG (1000 UNIT) tablet Take 1,000 Units by mouth daily. 02/27/23   [provider]  COMBIGAN  0.2-0.5 % ophthalmic solution Place 1 drop into the left eye every 12 (twelve) hours. 02/20/23   [provider]  COMBIVENT RESPIMAT  20-100 MCG/ACT AERS respimat Inhale 1 puff into the lungs every 4 (four) hours as needed for wheezing or shortness of breath. 07/05/23   [provider]  FLONASE  ALLERGY RELIEF 50 MCG/ACT nasal spray Place 2 sprays into both nostrils daily. 11/04/22   [provider]  gabapentin  (NEURONTIN ) 300 MG capsule Take 300 mg by mouth 2 (two) times daily. 02/27/23   [provider]  ipratropium-albuterol  (DUONEB) 0.5-2.5 (3) MG/3ML SOLN Take 3 mLs by nebulization every 8 (eight) hours as needed. 03/04/22   Caleen Qualia, MD  levothyroxine  (SYNTHROID ) 137 MCG tablet Take 137 mcg by mouth daily.    [provider]  LORazepam  (ATIVAN ) 0.5 MG tablet Take 0.5 mg by mouth daily. 02/27/23   [provider]  mirtazapine  (REMERON ) 7.5 MG tablet Take 7.5 mg by mouth at bedtime. 08/29/23   [provider]  NARCAN  4 MG/0.1ML LIQD nasal spray kit Place 0.4 mg into the nose once. 10/12/22   [provider]  nicotine  (NICODERM CQ  - DOSED IN MG/24 HOURS) 14 mg/24hr patch Place 1 patch (14 mg total) onto the skin daily. 03/17/23   Patel, Sona, MD  PARoxetine  (PAXIL ) 20 MG tablet Take 20 mg by mouth daily. 08/29/23   [provider]  polyethylene glycol powder (GLYCOLAX /MIRALAX ) 17 GM/SCOOP powder Take 17 g by mouth daily. 12/28/22   [provider]  rosuvastatin  (CRESTOR ) 20 MG tablet Take 20 mg by mouth at bedtime.    [provider]  senna (SENOKOT) 8.6 MG TABS tablet Take 2 tablets by mouth 2 (two) times daily. 02/27/23   [provider]  SUBOXONE  8-2 MG FILM Place 0.5 Film under the tongue 2 (two) times daily. 02/23/23   [provider]  TRELEGY ELLIPTA 100-62.5-25 MCG/ACT AEPB Inhale 1 puff into the lungs daily. 02/22/23   [provider]   Physical Exam: Vitals:   12/05/23 1034 12/05/23 1036 12/05/23 1501  BP: 128/67    Pulse: (!) 107    Resp: 20    Temp: 99.3 F (37.4 C)  99.3 F (37.4 C)  TempSrc: Oral  Oral   SpO2: (!) 88% 90%   Weight: 70.8 kg    Height: 5' 4 (1.626 m)     Constitutional: appears older than chronological age, chronically ill Eyes: PERRL, lids and conjunctivae normal ENMT: Mucous membranes are moist. Posterior pharynx clear of any exudate or lesions. Age-appropriate dentition. Hearing appropriate Neck: normal, supple, no masses, no thyromegaly Respiratory: Minimal end expiratory wheezing on auscultation, no crackles.  Mild increase respiratory effort.  Mild increased accessory muscle use.  Cardiovascular: Regular rate and rhythm, no murmurs / rubs / gallops. No extremity edema. 2+ pedal pulses. No carotid bruits.  Abdomen: Obese abdomen, no tenderness, no masses palpated, no hepatosplenomegaly. Bowel sounds positive.  Musculoskeletal: no clubbing / cyanosis. No joint deformity upper and lower extremities. Good ROM, no contractures, no atrophy. Normal muscle tone.  Skin: no rashes, lesions, ulcers. No induration Neurologic: Sensation  intact. Strength 5/5 in all 4.  Psychiatric: Normal judgment and insight. Alert and oriented x 3.  Depressed mood.   EKG: independently reviewed, showing sinus tachycardia with a rate of 107, QTc 443  Chest x-ray on Admission: I personally reviewed and I agree with radiologist reading as below.  DG Chest 2 View Result Date: 12/05/2023 CLINICAL DATA:  Shortness of breath. EXAM: CHEST - 2 VIEW COMPARISON:  10/16/2023. FINDINGS: Redemonstration of chronic increased interstitial markings which are nonspecific. Differential diagnosis includes chronic interstitial lung disease, atypical pneumonia, etc. No dense consolidation or lung collapse. Bilateral costophrenic angles are clear. Normal cardio-mediastinal silhouette. No acute osseous abnormalities. Old healed posterior left fourth and fifth rib fractures noted. The soft tissues are within normal limits. IMPRESSION: Redemonstration of chronic increased interstitial markings which are nonspecific.  Differential diagnosis includes chronic interstitial lung disease, atypical pneumonia, etc. No dense consolidation or lung collapse. Bilateral costophrenic angles are clear. Electronically Signed   By: Ree Molt M.D.   On: 12/05/2023 11:04   Labs on Admission: I have personally reviewed following labs  CBC: Recent Labs  Lab 12/05/23 1036  WBC 10.4  HGB 12.1  HCT 41.1  MCV 93.0  PLT 179   Basic Metabolic Panel: Recent Labs  Lab 12/05/23 1036  NA 140  K 3.7  CL 91*  CO2 37*  GLUCOSE 153*  BUN 9  CREATININE 0.64  CALCIUM  9.0   GFR: Estimated Creatinine Clearance: 66.7 mL/min (by C-G formula based on SCr of 0.64 mg/dL).  Urine analysis:    Component Value Date/Time   COLORURINE YELLOW (A) 09/03/2023 0045   APPEARANCEUR CLEAR (A) 09/03/2023 0045   APPEARANCEUR Turbid 01/16/2013 1541   LABSPEC 1.029 09/03/2023 0045   LABSPEC 1.025 01/16/2013 1541   PHURINE 6.0 09/03/2023 0045   GLUCOSEU NEGATIVE 09/03/2023 0045   GLUCOSEU Negative 01/16/2013 1541   HGBUR NEGATIVE 09/03/2023 0045   BILIRUBINUR NEGATIVE 09/03/2023 0045   BILIRUBINUR Negative 01/16/2013 1541   KETONESUR NEGATIVE 09/03/2023 0045   PROTEINUR 30 (A) 09/03/2023 0045   NITRITE NEGATIVE 09/03/2023 0045   LEUKOCYTESUR NEGATIVE 09/03/2023 0045   LEUKOCYTESUR 3+ 01/16/2013 1541   This document was prepared using Dragon Voice Recognition software and may include unintentional dictation errors.  Dr. Sherre [facility]  If 7PM-7AM, please contact overnight-coverage provider If 7AM-7PM, please contact day attending provider [email or web address]  12/05/2023, 5:47 PM

## 2023-12-05 NOTE — Assessment & Plan Note (Signed)
 Bipap ordered by EDP

## 2023-12-05 NOTE — ED Notes (Signed)
 Pt moved to room 18   report off to [employer].

## 2023-12-05 NOTE — Hospital Course (Signed)
 Mr. Caroline Coleman is a 68 year old female with history of COPD, chronic tobacco use, chronic hypoxic respiratory requiring 2 L nasal cannula at baseline, anxiety,, thyroid, history of alcohol use disorder, presents to the emergency department for chief concerns of cough, pain, shortness of breath.  Vitals in the ED showed temperature of 99.2, respiration 20, heart rate 107, blood pressure 100/57, SpO2 90% on 3 L nasal cannula. Past 7140, potassium 3.7, chloride 91, bicarb 37, BUN of 6, serum creatinine 2.4, EGFR greater than 60, nonfasting glucose 153, WBC, hemoglobin 12.1, platelet 100.  BNP 122.2.-28.  ED treatment: Treatment, ceftriaxone  1 g IV, azithromycin  500 mg IV.  Per ED, EMS gave patient 2 treatments of DuoNeb's and Solu-Medrol  125 mg IV.

## 2023-12-05 NOTE — Assessment & Plan Note (Signed)
 Counseling given Patient endorses readiness to stop tobacco use. Greater than 3 minutes spent on tobacco cessation counseling Tobacco cessation counseling:  Week one, smoke 19 cigarettes per day. Week two, smoke 18 cigarettes per day. Week three, smoke 17 cigarettes per day, continue until smoking half the amount of cigarettes per day Clean all indoor clothing, sheets, blankets, and freshen textile furniture to rid the smell of cigarettes Only smoke outside and wear outer covering Leave cigarettes and lighters outside in separate places Cigarettes are expensive, save your money so you can spend on your self and your children and grandchildren

## 2023-12-05 NOTE — ED Triage Notes (Signed)
 Patient to ED via [facility] from home for SOB. States also having cough and nausea for the past couple of days. Wears 2L [place] chronically. 88% on 2L in triage.

## 2023-12-05 NOTE — Assessment & Plan Note (Signed)
 Lorazepam 0.5 mg IV cefazolin for anxiety, 15 hours ordered

## 2023-12-05 NOTE — Assessment & Plan Note (Signed)
 As needed nicotine patch ordered for nicotine craving

## 2023-12-06 LAB — CBC
HCT: 37.3 % (ref 36.0–46.0)
Hemoglobin: 11.2 g/dL — ABNORMAL LOW (ref 12.0–15.0)
MCH: 26.9 pg (ref 26.0–34.0)
MCHC: 30 g/dL (ref 30.0–36.0)
MCV: 89.7 fL (ref 80.0–100.0)
Platelets: 216 10*3/uL (ref 150–400)
RBC: 4.16 MIL/uL (ref 3.87–5.11)
RDW: 13.1 % (ref 11.5–15.5)
WBC: 9.3 10*3/uL (ref 4.0–10.5)
nRBC: 0 % (ref 0.0–0.2)

## 2023-12-06 LAB — BASIC METABOLIC PANEL
Anion gap: 16 — ABNORMAL HIGH (ref 5–15)
BUN: 15 mg/dL (ref 8–23)
CO2: 34 mmol/L — ABNORMAL HIGH (ref 22–32)
Calcium: 9.4 mg/dL (ref 8.9–10.3)
Chloride: 93 mmol/L — ABNORMAL LOW (ref 98–111)
Creatinine, Ser: 0.67 mg/dL (ref 0.44–1.00)
GFR, Estimated: >60 mL/min (ref >60)
Glucose, Bld: 113 mg/dL — ABNORMAL HIGH (ref 70–99)
Potassium: 4.1 mmol/L (ref 3.5–5.1)
Sodium: 143 mmol/L (ref 135–145)

## 2023-12-06 MED ORDER — BUPRENORPHINE HCL-NALOXONE HCL 8-2 MG SL SUBL
0.5000 | SUBLINGUAL_TABLET | Freq: Two times a day (BID) | SUBLINGUAL | Status: DC
  Administered 2023-12-06 – 2023-12-10 (×9): 0.5 via SUBLINGUAL
  Filled 2023-12-06 (×9): qty 1

## 2023-12-06 MED ORDER — UMECLIDINIUM BROMIDE 62.5 MCG/ACT IN AEPB
1.0000 | INHALATION_SPRAY | Freq: Every day | RESPIRATORY_TRACT | Status: DC
  Administered 2023-12-07 – 2023-12-10 (×4): 1 via RESPIRATORY_TRACT
  Filled 2023-12-06 (×2): qty 7

## 2023-12-06 MED ORDER — PAROXETINE HCL 20 MG PO TABS
20.0000 mg | ORAL_TABLET | Freq: Every day | ORAL | Status: DC
  Administered 2023-12-06 – 2023-12-10 (×5): 20 mg via ORAL
  Filled 2023-12-06 (×5): qty 1

## 2023-12-06 MED ORDER — IPRATROPIUM-ALBUTEROL 0.5-2.5 (3) MG/3ML IN SOLN: 3.0000 mL | RESPIRATORY_TRACT | Status: DC | PRN

## 2023-12-06 MED ORDER — AZITHROMYCIN 500 MG PO TABS
500.0000 mg | ORAL_TABLET | Freq: Every day | ORAL | Status: AC
  Administered 2023-12-06 – 2023-12-07 (×2): 500 mg via ORAL
  Filled 2023-12-06 (×2): qty 1

## 2023-12-06 MED ORDER — FLUTICASONE FUROATE-VILANTEROL 100-25 MCG/ACT IN AEPB
1.0000 | INHALATION_SPRAY | Freq: Every day | RESPIRATORY_TRACT | Status: DC
  Administered 2023-12-07 – 2023-12-10 (×4): 1 via RESPIRATORY_TRACT
  Filled 2023-12-06 (×2): qty 28

## 2023-12-06 MED ORDER — MIRTAZAPINE 15 MG PO TABS
7.5000 mg | ORAL_TABLET | Freq: Every day | ORAL | Status: DC
Start: 2023-12-06 — End: 2023-12-10
  Administered 2023-12-06 – 2023-12-09 (×4): 7.5 mg via ORAL
  Filled 2023-12-06 (×4): qty 1

## 2023-12-06 MED ORDER — LORAZEPAM 0.5 MG PO TABS
0.5000 mg | ORAL_TABLET | Freq: Every day | ORAL | Status: DC | PRN
  Administered 2023-12-06 – 2023-12-08 (×3): 0.5 mg via ORAL
  Filled 2023-12-06 (×3): qty 1

## 2023-12-06 MED ORDER — NICOTINE 21 MG/24HR TD PT24
21.0000 mg | MEDICATED_PATCH | Freq: Every day | TRANSDERMAL | Status: DC
  Administered 2023-12-06 – 2023-12-10 (×5): 21 mg via TRANSDERMAL
  Filled 2023-12-06 (×5): qty 1

## 2023-12-06 NOTE — Progress Notes (Signed)
 Progress Note   Patient: Caroline Coleman FMW:[id number] DOB: 1956-08-04 DOA: 12/05/2023     1 DOS: the patient was seen and examined on 12/06/2023   Brief hospital course: Mr. Arley Garant is a 68 year old female with history of COPD, chronic tobacco use, chronic hypoxic respiratory requiring 2 L nasal cannula at baseline, anxiety,, thyroid, history of alcohol use disorder, presents to the emergency department for chief concerns of cough, pain, shortness of breath.  Vitals in the ED showed temperature of 99.2, respiration 20, heart rate 107, blood pressure 100/57, SpO2 90% on 3 L nasal cannula. Past 7140, potassium 3.7, chloride 91, bicarb 37, BUN of 6, serum creatinine 2.4, EGFR greater than 60, nonfasting glucose 153, WBC, hemoglobin 12.1, platelet 100.  BNP 122.2.-28.  ED treatment: Treatment, ceftriaxone  1 g IV, azithromycin  500 mg IV.  Per ED, EMS gave patient 2 treatments of DuoNeb's and Solu-Medrol  125 mg IV.  Assessment and Plan: COPD exacerbation (HCC) Chronic hypoxemic respiratory failure  Acute on chronic respiratory failure with hypercapnia (HCC) Procalcitonin negative Status post ceftriaxone  1 g IV, and azithromycin  500 mg IV, one-time doses were ordered by EDP Continue azithromycin  500mg  for 3 dose  Continue prednisone  for 4 more doses  Continue home inhalers and PRN duonebs  Bipap as needed Flutter valve, incentive spirometry  Tobacco use disoder Tobacco cessation given by previous provider. Patient would also likely benefit from chantix or bupropion if not contraindicated.    HLD (hyperlipidemia) Rosuvastatin  20 mg nightly reason  Nicotine  dependence As needed nicotine  patch ordered for nicotine  craving  Generalized anxiety disorder PRN lorazepam   Resume paroxetine    H/o substance use Continue suboxone  8-2mg  (0.5 tab BID) while inpatient usually on film   Insomnia Melatonin 5 mg nightly as needed for sleep      Subjective: Patient states that her  SOB is marginally improved.   Physical Exam: Vitals:   12/06/23 1400 12/06/23 1430 12/06/23 1445 12/06/23 1500  BP: (!) 133/92 (!) 125/47  (!) 122/55  Pulse: 71 95 90 93  Resp: (!) 28 (!) 24 (!) 21 (!) 25  Temp:      TempSrc:      SpO2:  96% 96% 96%  Weight:      Height:       Physical Exam  Constitutional: In no distress.  Cardiovascular: Normal rate, regular rhythm. No lower extremity edema  Pulmonary: Non labored breathing on [place], diffuse wheezing, no rales.   Abdominal: Soft. Normal bowel sounds. Non distended and non tender Musculoskeletal: Normal range of motion.     Neurological: Alert and oriented to person, place, and time. Non focal  Skin: Skin is warm and dry.   Data Reviewed:  I have reviewed all labs and images     Latest Ref Rng & Units 12/06/2023    6:06 AM 12/05/2023   10:36 AM 09/06/2023    6:15 AM  CBC  WBC 4.0 - 10.5 K/uL 9.3  10.4  5.8   Hemoglobin 12.0 - 15.0 g/dL 88.7  87.8  89.9   Hematocrit 36.0 - 46.0 % 37.3  41.1  32.3   Platelets 150 - 400 K/uL 216  179  127       Latest Ref Rng & Units 12/06/2023    6:06 AM 12/05/2023   10:36 AM 09/06/2023    6:15 AM  BMP  Glucose 70 - 99 mg/dL 886  846  854   BUN 8 - 23 mg/dL 15  9  22    Creatinine  0.44 - 1.00 mg/dL 9.32  9.35  9.39   Sodium 135 - 145 mmol/L 143  140  137   Potassium 3.5 - 5.1 mmol/L 4.1  3.7  4.3   Chloride 98 - 111 mmol/L 93  91  97   CO2 22 - 32 mmol/L 34  37  32   Calcium  8.9 - 10.3 mg/dL 9.4  9.0  8.4      Family Communication: Patient able to update family   Disposition: Status is: Inpatient Remains inpatient appropriate because: increased oxygen requirement   Planned Discharge Destination: Home    Time spent: 35 minutes  Author: Alban Pepper, MD 12/06/2023 3:43 PM  For on call review [email or web address].

## 2023-12-06 NOTE — ED Notes (Signed)
 Spoke with Harlene Frock with [facility] at bedside at this time. She can be reached at [phone] any time. For other disciplines, such as SW or physician consults, the main building can be reached at [phone]. She would like to be kept in the loop and communicates with RN that they can be closely involved with discharge planning.

## 2023-12-07 LAB — URINALYSIS, ROUTINE W REFLEX MICROSCOPIC
Bilirubin Urine: NEGATIVE
Glucose, UA: NEGATIVE mg/dL
Hgb urine dipstick: NEGATIVE
Ketones, ur: NEGATIVE mg/dL
Leukocytes,Ua: NEGATIVE
Nitrite: NEGATIVE
Protein, ur: NEGATIVE mg/dL
Specific Gravity, Urine: 1.016 (ref 1.005–1.030)
pH: 6 (ref 5.0–8.0)

## 2023-12-07 LAB — CBC
HCT: 37.3 % (ref 36.0–46.0)
Hemoglobin: 11.2 g/dL — ABNORMAL LOW (ref 12.0–15.0)
MCH: 26.4 pg (ref 26.0–34.0)
MCHC: 30 g/dL (ref 30.0–36.0)
MCV: 87.8 fL (ref 80.0–100.0)
Platelets: 231 10*3/uL (ref 150–400)
RBC: 4.25 MIL/uL (ref 3.87–5.11)
RDW: 13 % (ref 11.5–15.5)
WBC: 10.6 10*3/uL — ABNORMAL HIGH (ref 4.0–10.5)
nRBC: 0 % (ref 0.0–0.2)

## 2023-12-07 LAB — BASIC METABOLIC PANEL
Anion gap: 9 (ref 5–15)
BUN: 19 mg/dL (ref 8–23)
CO2: 39 mmol/L — ABNORMAL HIGH (ref 22–32)
Calcium: 9.1 mg/dL (ref 8.9–10.3)
Chloride: 90 mmol/L — ABNORMAL LOW (ref 98–111)
Creatinine, Ser: 0.6 mg/dL (ref 0.44–1.00)
GFR, Estimated: >60 mL/min (ref >60)
Glucose, Bld: 118 mg/dL — ABNORMAL HIGH (ref 70–99)
Potassium: 3.9 mmol/L (ref 3.5–5.1)
Sodium: 138 mmol/L (ref 135–145)

## 2023-12-07 NOTE — Plan of Care (Signed)

## 2023-12-07 NOTE — Progress Notes (Addendum)
 Progress Note   Patient: Caroline Coleman FMW:[id number] DOB: 11-17-1956 DOA: 12/05/2023     2 DOS: the patient was seen and examined on 12/07/2023   Brief hospital course: Mr. Caroline Coleman is a 68 year old female with history of COPD, chronic tobacco use, chronic hypoxic respiratory requiring 2 L nasal cannula at baseline, anxiety,, thyroid, history of alcohol use disorder, presents to the emergency department for chief concerns of cough, pain, shortness of breath.  Vitals in the ED showed temperature of 99.2, respiration 20, heart rate 107, blood pressure 100/57, SpO2 90% on 3 L nasal cannula. Past 7140, potassium 3.7, chloride 91, bicarb 37, BUN of 6, serum creatinine 2.4, EGFR greater than 60, nonfasting glucose 153, WBC, hemoglobin 12.1, platelet 100.  BNP 122.2.-28.  ED treatment: Treatment, ceftriaxone  1 g IV, azithromycin  500 mg IV.  Per ED, EMS gave patient 2 treatments of DuoNeb's and Solu-Medrol  125 mg IV.  Assessment and Plan: COPD exacerbation (HCC) Resolving.  Chronic hypoxemic respiratory failure  Acute on chronic respiratory failure with hypercapnia (HCC)  Continues to have some wheezing on exam. At home oxygen requirement. Feels dyspnea is improving.  Procalcitonin negative Status post ceftriaxone  1 g IV, and azithromycin  500 mg IV, one-time doses by EDP S/p azithromycin  500mg  for 3 dose  Continue prednisone  for 3 more doses  Continue home inhalers and PRN duonebs  Bipap as needed Flutter valve, incentive spirometry  Metabolic alkalosis  Appears to be chronic, compensation for hypercapnia.   Tobacco use disoder Tobacco cessation given by previous provider. Patient would also likely benefit from chantix or bupropion if not contraindicated.   Normocytic anemia  Leukocytosis  Hgb stable.  F/u iron panel  Mild leukocytosis likely due to steroids   HLD (hyperlipidemia) Rosuvastatin  20 mg nightly reason  Nicotine  dependence As needed nicotine  patch ordered  for nicotine  craving  Generalized anxiety disorder PRN lorazepam   paroxetine    H/o substance use Continue suboxone  8-2mg  (0.5 tab BID) while inpatient usually on film   Insomnia Melatonin 5 mg nightly as needed for sleep      Subjective: Feels SOB continues to improve. Has no chest pain.   Physical Exam: Vitals:   12/06/23 1800 12/06/23 2044 12/07/23 0411 12/07/23 0734  BP: 115/60 (!) 126/57 112/81 110/65  Pulse: 82 86 73 67  Resp:  18 18 18   Temp:  98.3 F (36.8 C) 98.1 F (36.7 C) 97.8 F (36.6 C)  TempSrc:      SpO2: 96% 94% 97% 98%  Weight:      Height:       Physical Exam  Constitutional: In no distress.  Cardiovascular: Normal rate, regular rhythm. No lower extremity edema  Pulmonary: Non labored breathing on [place], continues to have diffuse wheezing, no rales  Abdominal: Soft. Normal bowel sounds. Non distended and non tender Musculoskeletal: Normal range of motion.     Neurological: Alert and oriented to person, place, and time. Non focal  Skin: Skin is warm and dry.    Data Reviewed:  I have reviewed all labs and images     Latest Ref Rng & Units 12/07/2023    8:03 AM 12/06/2023    6:06 AM 12/05/2023   10:36 AM  CBC  WBC 4.0 - 10.5 K/uL 10.6  9.3  10.4   Hemoglobin 12.0 - 15.0 g/dL 88.7  88.7  87.8   Hematocrit 36.0 - 46.0 % 37.3  37.3  41.1   Platelets 150 - 400 K/uL 231  216  179  Latest Ref Rng & Units 12/07/2023    8:03 AM 12/06/2023    6:06 AM 12/05/2023   10:36 AM  BMP  Glucose 70 - 99 mg/dL 881  886  846   BUN 8 - 23 mg/dL 19  15  9    Creatinine 0.44 - 1.00 mg/dL 9.39  9.32  9.35   Sodium 135 - 145 mmol/L 138  143  140   Potassium 3.5 - 5.1 mmol/L 3.9  4.1  3.7   Chloride 98 - 111 mmol/L 90  93  91   CO2 22 - 32 mmol/L 39  34  37   Calcium  8.9 - 10.3 mg/dL 9.1  9.4  9.0      Family Communication: Patient able to update family   Disposition: Status is: Inpatient Remains inpatient appropriate because: increased oxygen requirement    Planned Discharge Destination: Home    Time spent: 35 minutes  Author: Alban Pepper, MD 12/07/2023 4:14 PM  For on call review [email or web address].

## 2023-12-07 NOTE — Progress Notes (Signed)
 On call provider contacted via secure chat at [phone] as follows: Pt was admitted last night to our unit and has not had a urine done. Her urine is extremely malodorous and cloudy. Can we have an order for UA/C&S please?

## 2023-12-07 NOTE — Progress Notes (Signed)
 Reported to on call provider that urinary symptoms are urgency, frequency, and incontinence along with being malodorous. Order obtained and urine specimen sent to the lab.

## 2023-12-07 NOTE — Plan of Care (Signed)
 Pt alert and oriented, no shortness of breath or distress noted. Pt on 2L/[place] which is her baseline. Pt receiving PO prednisone  and completed PO Azithromycin .  Problem: Education: Goal: Knowledge of General Education information will improve Description: Including pain rating scale, medication(s)/side effects and non-pharmacologic comfort measures Outcome: Progressing   Problem: Health Behavior/Discharge Planning: Goal: Ability to manage health-related needs will improve Outcome: Progressing   Problem: Clinical Measurements: Goal: Ability to maintain clinical measurements within normal limits will improve Outcome: Progressing Goal: Will remain free from infection Outcome: Progressing Goal: Diagnostic test results will improve Outcome: Progressing Goal: Respiratory complications will improve Outcome: Progressing Goal: Cardiovascular complication will be avoided Outcome: Progressing   Problem: Activity: Goal: Risk for activity intolerance will decrease Outcome: Progressing   Problem: Nutrition: Goal: Adequate nutrition will be maintained Outcome: Progressing   Problem: Coping: Goal: Level of anxiety will decrease Outcome: Progressing   Problem: Elimination: Goal: Will not experience complications related to bowel motility Outcome: Progressing Goal: Will not experience complications related to urinary retention Outcome: Progressing   Problem: Pain Management: Goal: General experience of comfort will improve Outcome: Progressing   Problem: Safety: Goal: Ability to remain free from injury will improve Outcome: Progressing   Problem: Skin Integrity: Goal: Risk for impaired skin integrity will decrease Outcome: Progressing   Problem: Education: Goal: Knowledge of disease or condition will improve Outcome: Progressing Goal: Knowledge of the prescribed therapeutic regimen will improve Outcome: Progressing Goal: Individualized Educational Video(s) Outcome: Progressing    Problem: Activity: Goal: Ability to tolerate increased activity will improve Outcome: Progressing Goal: Will verbalize the importance of balancing activity with adequate rest periods Outcome: Progressing   Problem: Respiratory: Goal: Ability to maintain a clear airway will improve Outcome: Progressing Goal: Levels of oxygenation will improve Outcome: Progressing Goal: Ability to maintain adequate ventilation will improve Outcome: Progressing

## 2023-12-08 DIAGNOSIS — J441 Chronic obstructive pulmonary disease with (acute) exacerbation: Secondary | ICD-10-CM | POA: Diagnosis not present

## 2023-12-08 LAB — CBC WITH DIFFERENTIAL/PLATELET
Abs Immature Granulocytes: 0.36 10*3/uL — ABNORMAL HIGH (ref 0.00–0.07)
Basophils Absolute: 0 10*3/uL (ref 0.0–0.1)
Basophils Relative: 0 %
Eosinophils Absolute: 0 10*3/uL (ref 0.0–0.5)
Eosinophils Relative: 1 %
HCT: 36.9 % (ref 36.0–46.0)
Hemoglobin: 11.4 g/dL — ABNORMAL LOW (ref 12.0–15.0)
Immature Granulocytes: 4 %
Lymphocytes Relative: 26 %
Lymphs Abs: 2.2 10*3/uL (ref 0.7–4.0)
MCH: 26.9 pg (ref 26.0–34.0)
MCHC: 30.9 g/dL (ref 30.0–36.0)
MCV: 87 fL (ref 80.0–100.0)
Monocytes Absolute: 0.8 10*3/uL (ref 0.1–1.0)
Monocytes Relative: 10 %
Neutro Abs: 5 10*3/uL (ref 1.7–7.7)
Neutrophils Relative %: 59 %
Platelets: 235 10*3/uL (ref 150–400)
RBC: 4.24 MIL/uL (ref 3.87–5.11)
RDW: 12.9 % (ref 11.5–15.5)
WBC: 8.3 10*3/uL (ref 4.0–10.5)
nRBC: 0 % (ref 0.0–0.2)

## 2023-12-08 LAB — BASIC METABOLIC PANEL
Anion gap: 9 (ref 5–15)
BUN: 18 mg/dL (ref 8–23)
CO2: 40 mmol/L — ABNORMAL HIGH (ref 22–32)
Calcium: 9 mg/dL (ref 8.9–10.3)
Chloride: 90 mmol/L — ABNORMAL LOW (ref 98–111)
Creatinine, Ser: 0.63 mg/dL (ref 0.44–1.00)
GFR, Estimated: >60 mL/min (ref >60)
Glucose, Bld: 97 mg/dL (ref 70–99)
Potassium: 3.6 mmol/L (ref 3.5–5.1)
Sodium: 139 mmol/L (ref 135–145)

## 2023-12-08 MED ORDER — METHYLPREDNISOLONE SODIUM SUCC 125 MG IJ SOLR
80.0000 mg | INTRAMUSCULAR | Status: DC
  Administered 2023-12-08 – 2023-12-09 (×2): 80 mg via INTRAVENOUS
  Filled 2023-12-08 (×2): qty 2

## 2023-12-08 MED ORDER — LORAZEPAM 0.5 MG PO TABS
0.5000 mg | ORAL_TABLET | Freq: Two times a day (BID) | ORAL | Status: DC
  Administered 2023-12-08 – 2023-12-10 (×4): 0.5 mg via ORAL
  Filled 2023-12-08 (×4): qty 1

## 2023-12-08 NOTE — Discharge Instructions (Signed)
 Your nurse navigator, Cleotilde Spadaccini, can be reached at [phone]

## 2023-12-08 NOTE — Plan of Care (Signed)

## 2023-12-08 NOTE — Progress Notes (Signed)
 Introduced patient to role of statistician. Intake questions completed.  Patient is actively involved with [facility]. [facility] provides transport to/from appointments. PCP is Dr. Eleanore with [facility].  Patient currently taking suboxone , prescribed by Dr. Eleanore. Patient states it has been about 7 years since she was drinking alcohol. She endorses it was during a time of depression. She states she currently has a therapist she sees at least once a month. Does endorse anxiety and reports having hand tremors typically in the morning. Bedside nurse aware. Patient's daughter lives with her and can assist as needed, although she does go to school full time. Patient chronically on 2L of oxygen, managed through [facility].  Denies any current SDOH needs.

## 2023-12-08 NOTE — Progress Notes (Signed)
 PHARMACIST - PHYSICIAN COMMUNICATION   CONCERNING: Methylprednisolone  IV    Current order: Methylprednisolone  IV 40 mg q12h     DESCRIPTION: Per [place] Protocol:   IV methylprednisolone  will be converted to either a q12h or q24h frequency with the same total daily dose (TDD).  Ordered Dose: 1 to 125 mg TDD; convert to: TDD q24h.  Ordered Dose: 126 to 250 mg TDD; convert to: TDD div q12h.  Ordered Dose: >250 mg TDD; DAW.  Order has been adjusted to: Methylprednisolone  IV 80 mg q24h   Suzann Allean LABOR , PharmD Clinical Pharmacist  12/08/2023 10:44 AM

## 2023-12-08 NOTE — Progress Notes (Signed)
 PROGRESS NOTE    Caroline Coleman  FMW:[id number] DOB: [birth year] DOA: 12/05/2023 PCP: Eleanore Ronal RAMAN, MD    Brief Narrative:   68 year old female with history of COPD, chronic tobacco use, chronic hypoxic respiratory requiring 2 L nasal cannula at baseline, anxiety,, thyroid, history of alcohol use disorder, presents to the emergency department for chief concerns of cough, pain, shortness of breath.   Vitals in the ED showed temperature of 99.2, respiration 20, heart rate 107, blood pressure 100/57, SpO2 90% on 3 L nasal cannula. Past 7140, potassium 3.7, chloride 91, bicarb 37, BUN of 6, serum creatinine 2.4, EGFR greater than 60, nonfasting glucose 153, WBC, hemoglobin 12.1, platelet 100.   BNP 122.2.-28.   ED treatment: Treatment, ceftriaxone  1 g IV, azithromycin  500 mg IV.   Per ED, EMS gave patient 2 treatments of DuoNeb's and Solu-Medrol  125 mg IV.   Assessment & Plan:   Principal Problem:   COPD exacerbation (HCC) Active Problems:   Tobacco abuse   Panic disorder with agoraphobia   Insomnia   Acquired hypothyroidism   Generalized anxiety disorder   Nicotine  dependence   HLD (hyperlipidemia)   Depression with anxiety   Acute respiratory failure with hypercapnia (HCC)  COPD exacerbation (HCC) Resolving.  Chronic hypoxemic respiratory failure  Acute on chronic respiratory failure with hypercapnia (HCC) Persistent wheezing on exam.  Procalcitonin negative.  At baseline oxygen requirement.  Dyspnea slowly improving. Plan: Restart IV steroids today Prednisone  p.o. from tomorrow Bronchodilators Mucolytic's  Metabolic alkalosis  Appears chronic   Tobacco use disoder Advised on cessation .  NicoDerm   Normocytic anemia  Leukocytosis  Hgb stable.  Consider outpatient workup for anemia  HLD (hyperlipidemia) PTA Crestor      Generalized anxiety disorder Scheduled lorazepam .  Patient states home dose is 0.5 twice daily paroxetine     H/o substance  use Suboxone    Insomnia As needed melatonin     DVT prophylaxis: Lovenox  Code Status: Full Family Communication: None Disposition Plan: Status is: Inpatient Remains inpatient appropriate because: Symptomatic COPD exacerbation.  Anticipate medical readiness for discharge 1 to 2 days.   Level of care: Telemetry Medical  Consultants:  None  Procedures:  None  Antimicrobials: None   Subjective: Seen and examined.  Resting in bed.  Reports shortness of breath is improving but not yet at baseline.  Objective: Vitals:   12/07/23 1628 12/07/23 2010 12/08/23 0405 12/08/23 0752  BP: 118/61 133/66 (!) 111/59 137/63  Pulse: 65 68 62 67  Resp: 16 18 18 19   Temp: 98 F (36.7 C) 98.2 F (36.8 C) 97.8 F (36.6 C) 98 F (36.7 C)  TempSrc:      SpO2: 92% 93% 92% 91%  Weight:      Height:        Intake/Output Summary (Last 24 hours) at 12/08/2023 1217 Last data filed at 12/08/2023 0900 Gross per 24 hour  Intake 360 ml  Output 600 ml  Net -240 ml   Filed Weights   12/05/23 1034  Weight: 70.8 kg    Examination:  General exam: Appears fatigued Respiratory system: Wheezing noted in all lung fields.  Equal air entry.  No conversational dyspnea.  3 L Cardiovascular system: S1-2, RRR, no murmurs, no pedal edema Gastrointestinal system: Soft NT/ND, normal bowel sounds Central nervous system: Alert and oriented. No focal neurological deficits. Extremities: Symmetric 5 x 5 power. Skin: No rashes, lesions or ulcers Psychiatry: Judgement and insight appear normal. Mood & affect flattened.     Data  Reviewed: I have personally reviewed following labs and imaging studies  CBC: Recent Labs  Lab 12/05/23 1036 12/06/23 0606 12/07/23 0803 12/08/23 0453  WBC 10.4 9.3 10.6* 8.3  NEUTROABS  --   --   --  5.0  HGB 12.1 11.2* 11.2* 11.4*  HCT 41.1 37.3 37.3 36.9  MCV 93.0 89.7 87.8 87.0  PLT 179 216 231 235   Basic Metabolic Panel: Recent Labs  Lab 12/05/23 1036  12/06/23 0606 12/07/23 0803 12/08/23 0453  NA 140 143 138 139  K 3.7 4.1 3.9 3.6  CL 91* 93* 90* 90*  CO2 37* 34* 39* 40*  GLUCOSE 153* 113* 118* 97  BUN 9 15 19 18   CREATININE 0.64 0.67 0.60 0.63  CALCIUM  9.0 9.4 9.1 9.0   GFR: Estimated Creatinine Clearance: 66.7 mL/min (by C-G formula based on SCr of 0.63 mg/dL). Liver Function Tests: No results for input(s): AST, ALT, ALKPHOS, BILITOT, PROT, ALBUMIN in the last 168 hours. No results for input(s): LIPASE, AMYLASE in the last 168 hours. No results for input(s): AMMONIA in the last 168 hours. Coagulation Profile: No results for input(s): INR, PROTIME in the last 168 hours. Cardiac Enzymes: No results for input(s): CKTOTAL, CKMB, CKMBINDEX, TROPONINI in the last 168 hours. BNP (last 3 results) No results for input(s): PROBNP in the last 8760 hours. HbA1C: No results for input(s): HGBA1C in the last 72 hours. CBG: Recent Labs  Lab 12/05/23 1614  GLUCAP 158*   Lipid Profile: No results for input(s): CHOL, HDL, LDLCALC, TRIG, CHOLHDL, LDLDIRECT in the last 72 hours. Thyroid Function Tests: No results for input(s): TSH, T4TOTAL, FREET4, T3FREE, THYROIDAB in the last 72 hours. Anemia Panel: No results for input(s): VITAMINB12, FOLATE, FERRITIN, TIBC, IRON, RETICCTPCT in the last 72 hours. Sepsis Labs: Recent Labs  Lab 12/05/23 1925  PROCALCITON <0.10    Recent Results (from the past 240 hours)  Resp panel by RT-PCR (RSV, Flu A&B, Covid) Anterior Nasal Swab     Status: None   Collection Time: 12/05/23  1:44 PM   Specimen: Anterior Nasal Swab  Result Value Ref Range Status   SARS Coronavirus 2 by RT PCR NEGATIVE NEGATIVE Final    Comment: (NOTE) SARS-CoV-2 target nucleic acids are NOT DETECTED.  The SARS-CoV-2 RNA is generally detectable in upper respiratory specimens during the acute phase of infection. The lowest concentration of SARS-CoV-2 viral  copies this assay can detect is 138 copies/mL. A negative result does not preclude SARS-Cov-2 infection and should not be used as the sole basis for treatment or other patient management decisions. A negative result may occur with  improper specimen collection/handling, submission of specimen other than nasopharyngeal swab, presence of viral mutation(s) within the areas targeted by this assay, and inadequate number of viral copies(<138 copies/mL). A negative result must be combined with clinical observations, patient history, and epidemiological information. The expected result is Negative.  Fact Sheet for Patients:  bloggercourse.com  Fact Sheet for Healthcare Providers:  seriousbroker.it  This test is no t yet approved or cleared by the [place]  FDA and  has been authorized for detection and/or diagnosis of SARS-CoV-2 by FDA under an Emergency Use Authorization (EUA). This EUA will remain  in effect (meaning this test can be used) for the duration of the COVID-19 declaration under Section 564(b)(1) of the Act, 21 U.S.C.section 360bbb-3(b)(1), unless the authorization is terminated  or revoked sooner.       Influenza A by PCR NEGATIVE NEGATIVE Final   Influenza B  by PCR NEGATIVE NEGATIVE Final    Comment: (NOTE) The Xpert Xpress SARS-CoV-2/FLU/RSV plus assay is intended as an aid in the diagnosis of influenza from Nasopharyngeal swab specimens and should not be used as a sole basis for treatment. Nasal washings and aspirates are unacceptable for Xpert Xpress SARS-CoV-2/FLU/RSV testing.  Fact Sheet for Patients: bloggercourse.com  Fact Sheet for Healthcare Providers: seriousbroker.it  This test is not yet approved or cleared by the [place]  FDA and has been authorized for detection and/or diagnosis of SARS-CoV-2 by FDA under an Emergency Use Authorization (EUA). This  EUA will remain in effect (meaning this test can be used) for the duration of the COVID-19 declaration under Section 564(b)(1) of the Act, 21 U.S.C. section 360bbb-3(b)(1), unless the authorization is terminated or revoked.     Resp Syncytial Virus by PCR NEGATIVE NEGATIVE Final    Comment: (NOTE) Fact Sheet for Patients: bloggercourse.com  Fact Sheet for Healthcare Providers: seriousbroker.it  This test is not yet approved or cleared by the [place]  FDA and has been authorized for detection and/or diagnosis of SARS-CoV-2 by FDA under an Emergency Use Authorization (EUA). This EUA will remain in effect (meaning this test can be used) for the duration of the COVID-19 declaration under Section 564(b)(1) of the Act, 21 U.S.C. section 360bbb-3(b)(1), unless the authorization is terminated or revoked.  Performed at [facility], [address], [place], [place] [address]          Radiology Studies: No results found.      Scheduled Meds:  buprenorphine -naloxone   0.5 tablet Sublingual BID   enoxaparin  (LOVENOX ) injection  40 mg Subcutaneous Q24H   fluticasone  furoate-vilanterol  1 puff Inhalation Daily   And   umeclidinium bromide   1 puff Inhalation Daily   LORazepam   0.5 mg Oral BID   methylPREDNISolone  (SOLU-MEDROL ) injection  80 mg Intravenous Q24H   mirtazapine   7.5 mg Oral QHS   nicotine   21 mg Transdermal Daily   PARoxetine   20 mg Oral Daily   rosuvastatin   20 mg Oral QHS   Continuous Infusions:   LOS: 3 days      Calvin KATHEE Robson, MD [facility]   If 7PM-7AM, please contact night-coverage  12/08/2023, 12:17 PM

## 2023-12-08 NOTE — Plan of Care (Signed)

## 2023-12-09 DIAGNOSIS — J441 Chronic obstructive pulmonary disease with (acute) exacerbation: Secondary | ICD-10-CM | POA: Diagnosis not present

## 2023-12-09 MED ORDER — ALUM & MAG HYDROXIDE-SIMETH 200-200-20 MG/5ML PO SUSP
30.0000 mL | ORAL | Status: DC | PRN
  Administered 2023-12-09: 30 mL via ORAL
  Filled 2023-12-09: qty 30

## 2023-12-09 MED ORDER — BISACODYL 10 MG RE SUPP
10.0000 mg | Freq: Every day | RECTAL | Status: DC | PRN
  Administered 2023-12-09: 10 mg via RECTAL
  Filled 2023-12-09: qty 1

## 2023-12-09 MED ORDER — SENNOSIDES-DOCUSATE SODIUM 8.6-50 MG PO TABS
1.0000 | ORAL_TABLET | Freq: Two times a day (BID) | ORAL | Status: DC
  Administered 2023-12-09 – 2023-12-10 (×2): 1 via ORAL
  Filled 2023-12-09 (×3): qty 1

## 2023-12-09 MED ORDER — METHYLPREDNISOLONE SODIUM SUCC 40 MG IJ SOLR
40.0000 mg | INTRAMUSCULAR | Status: AC
  Administered 2023-12-10: 40 mg via INTRAVENOUS
  Filled 2023-12-09: qty 1

## 2023-12-09 MED ORDER — POLYETHYLENE GLYCOL 3350 17 G PO PACK
17.0000 g | PACK | Freq: Every day | ORAL | Status: DC
  Administered 2023-12-09 – 2023-12-10 (×2): 17 g via ORAL
  Filled 2023-12-09 (×2): qty 1

## 2023-12-09 NOTE — Progress Notes (Signed)
 PROGRESS NOTE    Caroline Coleman  FMW:[id number] DOB: 1956-02-19 DOA: 12/05/2023 PCP: Eleanore Ronal RAMAN, MD    Brief Narrative:   68 year old female with history of COPD, chronic tobacco use, chronic hypoxic respiratory requiring 2 L nasal cannula at baseline, anxiety,, thyroid, history of alcohol use disorder, presents to the emergency department for chief concerns of cough, pain, shortness of breath.   Vitals in the ED showed temperature of 99.2, respiration 20, heart rate 107, blood pressure 100/57, SpO2 90% on 3 L nasal cannula. Past 7140, potassium 3.7, chloride 91, bicarb 37, BUN of 6, serum creatinine 2.4, EGFR greater than 60, nonfasting glucose 153, WBC, hemoglobin 12.1, platelet 100.   BNP 122.2.-28.   ED treatment: Treatment, ceftriaxone  1 g IV, azithromycin  500 mg IV.   Per ED, EMS gave patient 2 treatments of DuoNeb's and Solu-Medrol  125 mg IV.   Assessment & Plan:   Principal Problem:   COPD exacerbation (HCC) Active Problems:   Tobacco abuse   Panic disorder with agoraphobia   Insomnia   Acquired hypothyroidism   Generalized anxiety disorder   Nicotine  dependence   HLD (hyperlipidemia)   Depression with anxiety   Acute respiratory failure with hypercapnia (HCC)  COPD exacerbation (HCC) Resolving.  Chronic hypoxemic respiratory failure  Acute on chronic respiratory failure with hypercapnia (HCC) Persistent wheezing on exam.  Procalcitonin negative.  At baseline oxygen requirement.  Dyspnea slowly improving. Plan: Wean IV steroids Solu-Medrol  40 mg once daily starting tomorrow P.o. prednisone  starting 1/13 x 5 days Bronchodilators Mucolytic's Anticipate DC 1/12  Metabolic alkalosis  Appears chronic   Tobacco use disoder Advised on cessation As needed NicoDerm patch   Normocytic anemia  Leukocytosis  Hgb stable.  Consider outpatient workup for anemia  HLD (hyperlipidemia) PTA Crestor      Generalized anxiety disorder Scheduled lorazepam .  Patient  states home dose is 0.5 twice daily paroxetine     H/o substance use Suboxone    Insomnia As needed melatonin     DVT prophylaxis: Lovenox  Code Status: Full Family Communication: None Disposition Plan: Status is: Inpatient Remains inpatient appropriate because: Symptomatic COPD exacerbation.  Anticipate discharge 1/12   Level of care: Telemetry Medical  Consultants:  None  Procedures:  None  Antimicrobials: None   Subjective: Seen and examined.  Sitting up in chair.  Shortness of breath improving.  Not yet at baseline.  Objective: Vitals:   12/08/23 1559 12/08/23 1936 12/09/23 0437 12/09/23 0729  BP: (!) 132/55 131/70 105/63 108/80  Pulse: 64 65 69 65  Resp: 18 18 17 16   Temp: 98.8 F (37.1 C) 98.4 F (36.9 C) 97.6 F (36.4 C) 98.2 F (36.8 C)  TempSrc:    Oral  SpO2: 98% 90% 90% 90%  Weight:      Height:        Intake/Output Summary (Last 24 hours) at 12/09/2023 1254 Last data filed at 12/09/2023 0830 Gross per 24 hour  Intake 420 ml  Output --  Net 420 ml   Filed Weights   12/05/23 1034 12/08/23 1346  Weight: 70.8 kg 74.1 kg    Examination:  General exam: NAD Respiratory system: Bilateral wheezing.  Equal air entry.  No conversational dyspnea.  3 L Cardiovascular system: S1-2, RRR, no murmurs, no pedal edema Gastrointestinal system: Soft NT/ND, normal bowel sounds Central nervous system: Alert and oriented. No focal neurological deficits. Extremities: Symmetric 5 x 5 power. Skin: No rashes, lesions or ulcers Psychiatry: Judgement and insight appear normal. Mood & affect flattened.  Data Reviewed: I have personally reviewed following labs and imaging studies  CBC: Recent Labs  Lab 12/05/23 1036 12/06/23 0606 12/07/23 0803 12/08/23 0453  WBC 10.4 9.3 10.6* 8.3  NEUTROABS  --   --   --  5.0  HGB 12.1 11.2* 11.2* 11.4*  HCT 41.1 37.3 37.3 36.9  MCV 93.0 89.7 87.8 87.0  PLT 179 216 231 235   Basic Metabolic Panel: Recent Labs   Lab 12/05/23 1036 12/06/23 0606 12/07/23 0803 12/08/23 0453  NA 140 143 138 139  K 3.7 4.1 3.9 3.6  CL 91* 93* 90* 90*  CO2 37* 34* 39* 40*  GLUCOSE 153* 113* 118* 97  BUN 9 15 19 18   CREATININE 0.64 0.67 0.60 0.63  CALCIUM  9.0 9.4 9.1 9.0   GFR: Estimated Creatinine Clearance: 68.3 mL/min (by C-G formula based on SCr of 0.63 mg/dL). Liver Function Tests: No results for input(s): AST, ALT, ALKPHOS, BILITOT, PROT, ALBUMIN in the last 168 hours. No results for input(s): LIPASE, AMYLASE in the last 168 hours. No results for input(s): AMMONIA in the last 168 hours. Coagulation Profile: No results for input(s): INR, PROTIME in the last 168 hours. Cardiac Enzymes: No results for input(s): CKTOTAL, CKMB, CKMBINDEX, TROPONINI in the last 168 hours. BNP (last 3 results) No results for input(s): PROBNP in the last 8760 hours. HbA1C: No results for input(s): HGBA1C in the last 72 hours. CBG: Recent Labs  Lab 12/05/23 1614  GLUCAP 158*   Lipid Profile: No results for input(s): CHOL, HDL, LDLCALC, TRIG, CHOLHDL, LDLDIRECT in the last 72 hours. Thyroid Function Tests: No results for input(s): TSH, T4TOTAL, FREET4, T3FREE, THYROIDAB in the last 72 hours. Anemia Panel: No results for input(s): VITAMINB12, FOLATE, FERRITIN, TIBC, IRON, RETICCTPCT in the last 72 hours. Sepsis Labs: Recent Labs  Lab 12/05/23 1925  PROCALCITON <0.10    Recent Results (from the past 240 hours)  Resp panel by RT-PCR (RSV, Flu A&B, Covid) Anterior Nasal Swab     Status: None   Collection Time: 12/05/23  1:44 PM   Specimen: Anterior Nasal Swab  Result Value Ref Range Status   SARS Coronavirus 2 by RT PCR NEGATIVE NEGATIVE Final    Comment: (NOTE) SARS-CoV-2 target nucleic acids are NOT DETECTED.  The SARS-CoV-2 RNA is generally detectable in upper respiratory specimens during the acute phase of infection. The lowest concentration  of SARS-CoV-2 viral copies this assay can detect is 138 copies/mL. A negative result does not preclude SARS-Cov-2 infection and should not be used as the sole basis for treatment or other patient management decisions. A negative result may occur with  improper specimen collection/handling, submission of specimen other than nasopharyngeal swab, presence of viral mutation(s) within the areas targeted by this assay, and inadequate number of viral copies(<138 copies/mL). A negative result must be combined with clinical observations, patient history, and epidemiological information. The expected result is Negative.  Fact Sheet for Patients:  bloggercourse.com  Fact Sheet for Healthcare Providers:  seriousbroker.it  This test is no t yet approved or cleared by the [place]  FDA and  has been authorized for detection and/or diagnosis of SARS-CoV-2 by FDA under an Emergency Use Authorization (EUA). This EUA will remain  in effect (meaning this test can be used) for the duration of the COVID-19 declaration under Section 564(b)(1) of the Act, 21 U.S.C.section 360bbb-3(b)(1), unless the authorization is terminated  or revoked sooner.       Influenza A by PCR NEGATIVE NEGATIVE Final   Influenza  B by PCR NEGATIVE NEGATIVE Final    Comment: (NOTE) The Xpert Xpress SARS-CoV-2/FLU/RSV plus assay is intended as an aid in the diagnosis of influenza from Nasopharyngeal swab specimens and should not be used as a sole basis for treatment. Nasal washings and aspirates are unacceptable for Xpert Xpress SARS-CoV-2/FLU/RSV testing.  Fact Sheet for Patients: bloggercourse.com  Fact Sheet for Healthcare Providers: seriousbroker.it  This test is not yet approved or cleared by the [place]  FDA and has been authorized for detection and/or diagnosis of SARS-CoV-2 by FDA under an Emergency Use  Authorization (EUA). This EUA will remain in effect (meaning this test can be used) for the duration of the COVID-19 declaration under Section 564(b)(1) of the Act, 21 U.S.C. section 360bbb-3(b)(1), unless the authorization is terminated or revoked.     Resp Syncytial Virus by PCR NEGATIVE NEGATIVE Final    Comment: (NOTE) Fact Sheet for Patients: bloggercourse.com  Fact Sheet for Healthcare Providers: seriousbroker.it  This test is not yet approved or cleared by the [place]  FDA and has been authorized for detection and/or diagnosis of SARS-CoV-2 by FDA under an Emergency Use Authorization (EUA). This EUA will remain in effect (meaning this test can be used) for the duration of the COVID-19 declaration under Section 564(b)(1) of the Act, 21 U.S.C. section 360bbb-3(b)(1), unless the authorization is terminated or revoked.  Performed at [facility], [address] [place], [place] [address]          Radiology Studies: No results found.      Scheduled Meds:  buprenorphine -naloxone   0.5 tablet Sublingual BID   enoxaparin  (LOVENOX ) injection  40 mg Subcutaneous Q24H   fluticasone  furoate-vilanterol  1 puff Inhalation Daily   And   umeclidinium bromide   1 puff Inhalation Daily   LORazepam   0.5 mg Oral BID   [START ON 12/10/2023] methylPREDNISolone  (SOLU-MEDROL ) injection  40 mg Intravenous Q24H   mirtazapine   7.5 mg Oral QHS   nicotine   21 mg Transdermal Daily   PARoxetine   20 mg Oral Daily   polyethylene glycol  17 g Oral Daily   rosuvastatin   20 mg Oral QHS   senna-docusate  1 tablet Oral BID   Continuous Infusions:   LOS: 4 days      Caroline KATHEE Robson, MD [facility]   If 7PM-7AM, please contact night-coverage  12/09/2023, 12:54 PM

## 2023-12-09 NOTE — Plan of Care (Signed)
  Problem: Education: Goal: Knowledge of General Education information will improve Description: Including pain rating scale, medication(s)/side effects and non-pharmacologic comfort measures Outcome: Progressing   Problem: Health Behavior/Discharge Planning: Goal: Ability to manage health-related needs will improve Outcome: Progressing   Problem: Clinical Measurements: Goal: Ability to maintain clinical measurements within normal limits will improve Outcome: Progressing Goal: Will remain free from infection Outcome: Progressing Goal: Diagnostic test results will improve Outcome: Progressing   Problem: Respiratory: Goal: Ability to maintain a clear airway will improve Outcome: Progressing Goal: Levels of oxygenation will improve Outcome: Progressing Goal: Ability to maintain adequate ventilation will improve Outcome: Progressing

## 2023-12-09 NOTE — Plan of Care (Signed)

## 2023-12-10 DIAGNOSIS — J441 Chronic obstructive pulmonary disease with (acute) exacerbation: Secondary | ICD-10-CM | POA: Diagnosis not present

## 2023-12-10 MED ORDER — PREDNISONE 20 MG PO TABS: 40.0000 mg | ORAL_TABLET | Freq: Every day | ORAL | Status: DC

## 2023-12-10 MED ORDER — PREDNISONE 20 MG PO TABS: 40.0000 mg | ORAL_TABLET | Freq: Every day | ORAL | 0 refills | Status: AC

## 2023-12-10 NOTE — Plan of Care (Signed)
   Problem: Education: Goal: Knowledge of General Education information will improve Description: Including pain rating scale, medication(s)/side effects and non-pharmacologic comfort measures Outcome: Adequate for Discharge   Problem: Health Behavior/Discharge Planning: Goal: Ability to manage health-related needs will improve Outcome: Adequate for Discharge   Problem: Clinical Measurements: Goal: Ability to maintain clinical measurements within normal limits will improve Outcome: Adequate for Discharge Goal: Will remain free from infection Outcome: Adequate for Discharge Goal: Diagnostic test results will improve Outcome: Adequate for Discharge Goal: Respiratory complications will improve Outcome: Adequate for Discharge Goal: Cardiovascular complication will be avoided Outcome: Adequate for Discharge   Problem: Activity: Goal: Risk for activity intolerance will decrease Outcome: Adequate for Discharge   Problem: Nutrition: Goal: Adequate nutrition will be maintained Outcome: Adequate for Discharge   Problem: Coping: Goal: Level of anxiety will decrease Outcome: Adequate for Discharge   Problem: Elimination: Goal: Will not experience complications related to bowel motility Outcome: Adequate for Discharge Goal: Will not experience complications related to urinary retention Outcome: Adequate for Discharge   Problem: Pain Management: Goal: General experience of comfort will improve Outcome: Adequate for Discharge   Problem: Safety: Goal: Ability to remain free from injury will improve Outcome: Adequate for Discharge   Problem: Skin Integrity: Goal: Risk for impaired skin integrity will decrease Outcome: Adequate for Discharge   Problem: Education: Goal: Knowledge of disease or condition will improve Outcome: Adequate for Discharge Goal: Knowledge of the prescribed therapeutic regimen will improve Outcome: Adequate for Discharge Goal: Individualized Educational  Video(s) Outcome: Adequate for Discharge   Problem: Activity: Goal: Ability to tolerate increased activity will improve Outcome: Adequate for Discharge Goal: Will verbalize the importance of balancing activity with adequate rest periods Outcome: Adequate for Discharge   Problem: Respiratory: Goal: Ability to maintain a clear airway will improve Outcome: Adequate for Discharge Goal: Levels of oxygenation will improve Outcome: Adequate for Discharge Goal: Ability to maintain adequate ventilation will improve Outcome: Adequate for Discharge

## 2023-12-10 NOTE — Discharge Summary (Signed)
 Physician Discharge Summary  Caroline Coleman FMW:[id number] DOB: [birth year] DOA: 12/05/2023  PCP: Eleanore Ronal RAMAN, MD  Admit date: 12/05/2023 Discharge date: 12/10/2023  Admitted From: Home Disposition:  Home  Recommendations for Outpatient Follow-up:  Follow up with PCP in 1-2 weeks   Home Health:No  Equipment/Devices:Oxygen 3L via [place]   Discharge Condition:Stable  CODE STATUS:FULL  Diet recommendation: Reg  Brief/Interim Summary:   68 year old female with history of COPD, chronic tobacco use, chronic hypoxic respiratory requiring 2 L nasal cannula at baseline, anxiety,, thyroid, history of alcohol use disorder, presents to the emergency department for chief concerns of cough, pain, shortness of breath.   Vitals in the ED showed temperature of 99.2, respiration 20, heart rate 107, blood pressure 100/57, SpO2 90% on 3 L nasal cannula. Past 7140, potassium 3.7, chloride 91, bicarb 37, BUN of 6, serum creatinine 2.4, EGFR greater than 60, nonfasting glucose 153, WBC, hemoglobin 12.1, platelet 100.    Discharge Diagnoses:  Principal Problem:   COPD exacerbation (HCC) Active Problems:   Tobacco abuse   Panic disorder with agoraphobia   Insomnia   Acquired hypothyroidism   Generalized anxiety disorder   Nicotine  dependence   HLD (hyperlipidemia)   Depression with anxiety   Acute respiratory failure with hypercapnia (HCC)  COPD exacerbation (HCC) Resolving.  Chronic hypoxemic respiratory failure  Acute on chronic respiratory failure with hypercapnia (HCC) Persistent wheezing on exam.  Procalcitonin negative.  At baseline oxygen requirement.  Dyspnea slowly improving. At baseline at time of DC Plan: Discharge home.  Prescription provided for prednisone  40 mg daily x 4 additional days.  Can resume home bronchodilator regimen.  Confirmed that patient has nebulizer with appropriate medications and supplementary oxygen at home.  Can resume home bronchodilator regimen.  [facility].     Discharge Instructions  Discharge Instructions     Diet - low sodium heart healthy   Complete by: As directed    Increase activity slowly   Complete by: As directed       Allergies as of 12/10/2023       Reactions   Penicillin G Swelling   Sulfa Antibiotics Other (See Comments), Swelling   REACTION UNKNOWN Per patient - both hands swell. Hands draw up    REACTION UNKNOWN   Penicillins Other (See Comments)   Has patient had a PCN reaction causing immediate rash, facial/tongue/throat swelling, SOB or lightheadedness with hypotension: no Has patient had a PCN reaction causing severe rash involving mucus membranes or skin necrosis: no Has patient had a PCN reaction that required hospitalization no REACTION UNKNOWN Has patient had a PCN reaction occurring within the last 10 years: yes If all of the above answers are NO, then may proceed with Cephalosporin use.        Medication List     STOP taking these medications    Combigan  0.2-0.5 % ophthalmic solution Generic drug: brimonidine -timolol        TAKE these medications    acetaminophen  650 MG CR tablet Commonly known as: TYLENOL  Take 1,300 mg by mouth 2 (two) times daily.   albuterol  108 (90 Base) MCG/ACT inhaler Commonly known as: VENTOLIN  HFA Inhale 2 puffs into the lungs every 6 (six) hours as needed for wheezing or shortness of breath.   bisacodyl  10 MG suppository Commonly known as: DULCOLAX Place 10 mg rectally daily.   budesonide  0.25 MG/2ML nebulizer solution Commonly known as: PULMICORT  Take 2 mLs (0.25 mg total) by nebulization in the morning  and at bedtime.   cholecalciferol  25 MCG (1000 UNIT) tablet Commonly known as: VITAMIN D3 Take 1,000 Units by mouth daily.   Flonase  Allergy Relief 50 MCG/ACT nasal spray Generic drug: fluticasone  Place 2 sprays into both nostrils daily.   gabapentin  100 MG capsule Commonly known as: NEURONTIN  Take 200 mg by  mouth 2 (two) times daily. What changed: Another medication with the same name was removed. Continue taking this medication, and follow the directions you see here.   ipratropium-albuterol  0.5-2.5 (3) MG/3ML Soln Commonly known as: DUONEB Take 3 mLs by nebulization every 8 (eight) hours as needed.   Combivent Respimat 20-100 MCG/ACT Aers respimat Generic drug: Ipratropium-Albuterol  Inhale 1 puff into the lungs every 4 (four) hours as needed for wheezing or shortness of breath.   levothyroxine  137 MCG tablet Commonly known as: SYNTHROID  Take 137 mcg by mouth daily.   LORazepam  0.5 MG tablet Commonly known as: ATIVAN  Take 0.5 mg by mouth daily.   mirtazapine  7.5 MG tablet Commonly known as: REMERON  Take 7.5 mg by mouth at bedtime.   Narcan  4 MG/0.1ML Liqd nasal spray kit Generic drug: naloxone  Place 0.4 mg into the nose once.   nicotine  14 mg/24hr patch Commonly known as: NICODERM CQ  - dosed in mg/24 hours Place 1 patch (14 mg total) onto the skin daily.   PARoxetine  20 MG tablet Commonly known as: PAXIL  Take 20 mg by mouth daily.   polyethylene glycol powder 17 GM/SCOOP powder Commonly known as: GLYCOLAX /MIRALAX  Take 17 g by mouth daily.   predniSONE  20 MG tablet Commonly known as: DELTASONE  Take 2 tablets (40 mg total) by mouth daily with breakfast for 4 days. Start taking on: December 11, 2023   rosuvastatin  20 MG tablet Commonly known as: CRESTOR  Take 20 mg by mouth at bedtime.   senna 8.6 MG Tabs tablet Commonly known as: SENOKOT Take 2 tablets by mouth 2 (two) times daily.   Suboxone  8-2 MG Film Generic drug: Buprenorphine  HCl-Naloxone  HCl Place 0.5 Film under the tongue 2 (two) times daily.   Trelegy Ellipta 100-62.5-25 MCG/ACT Aepb Generic drug: Fluticasone -Umeclidin-Vilant Inhale 1 puff into the lungs daily.        Follow-up Information     Mouw, Ronal RAMAN, MD Follow up.   Specialty: Family Medicine Why: Office closed patient to make own follow up  appt  Hospital follow up Contact information: [address] [place] [address] [phone]                Allergies  Allergen Reactions   Penicillin G Swelling   Sulfa Antibiotics Other (See Comments) and Swelling    REACTION UNKNOWN  Per patient - both hands swell.  Hands draw up    REACTION UNKNOWN   Penicillins Other (See Comments)    Has patient had a PCN reaction causing immediate rash, facial/tongue/throat swelling, SOB or lightheadedness with hypotension: no Has patient had a PCN reaction causing severe rash involving mucus membranes or skin necrosis: no Has patient had a PCN reaction that required hospitalization no  REACTION UNKNOWN Has patient had a PCN reaction occurring within the last 10 years: yes If all of the above answers are NO, then may proceed with Cephalosporin use.      Consultations: None   Procedures/Studies: DG Chest 2 View Result Date: 12/05/2023 CLINICAL DATA:  Shortness of breath. EXAM: CHEST - 2 VIEW COMPARISON:  10/16/2023. FINDINGS: Redemonstration of chronic increased interstitial markings which are nonspecific. Differential diagnosis includes chronic interstitial lung disease, atypical pneumonia,  etc. No dense consolidation or lung collapse. Bilateral costophrenic angles are clear. Normal cardio-mediastinal silhouette. No acute osseous abnormalities. Old healed posterior left fourth and fifth rib fractures noted. The soft tissues are within normal limits. IMPRESSION: Redemonstration of chronic increased interstitial markings which are nonspecific. Differential diagnosis includes chronic interstitial lung disease, atypical pneumonia, etc. No dense consolidation or lung collapse. Bilateral costophrenic angles are clear. Electronically Signed   By: Ree Molt M.D.   On: 12/05/2023 11:04      Subjective: Seen and examined on the day of discharge.  Stable no distress.  Appropriate for discharge home.  Discharge Exam: Vitals:    12/10/23 0532 12/10/23 0725  BP: (!) 100/56 137/65  Pulse: 62 65  Resp: 19 18  Temp: 98 F (36.7 C) 98 F (36.7 C)  SpO2: 97% 96%   Vitals:   12/09/23 1612 12/09/23 1929 12/10/23 0532 12/10/23 0725  BP: 130/71 (!) 128/55 (!) 100/56 137/65  Pulse: 67 76 62 65  Resp: 16  19 18   Temp: 97.6 F (36.4 C) 98.4 F (36.9 C) 98 F (36.7 C) 98 F (36.7 C)  TempSrc: Oral     SpO2: 93% 94% 97% 96%  Weight:      Height:        General: Pt is alert, awake, not in acute distress Cardiovascular: RRR, S1/S2 +, no rubs, no gallops Respiratory: CTA bilaterally, no wheezing, no rhonchi Abdominal: Soft, NT, ND, bowel sounds + Extremities: no edema, no cyanosis    The results of significant diagnostics from this hospitalization (including imaging, microbiology, ancillary and laboratory) are listed below for reference.     Microbiology: Recent Results (from the past 240 hours)  Resp panel by RT-PCR (RSV, Flu A&B, Covid) Anterior Nasal Swab     Status: None   Collection Time: 12/05/23  1:44 PM   Specimen: Anterior Nasal Swab  Result Value Ref Range Status   SARS Coronavirus 2 by RT PCR NEGATIVE NEGATIVE Final    Comment: (NOTE) SARS-CoV-2 target nucleic acids are NOT DETECTED.  The SARS-CoV-2 RNA is generally detectable in upper respiratory specimens during the acute phase of infection. The lowest concentration of SARS-CoV-2 viral copies this assay can detect is 138 copies/mL. A negative result does not preclude SARS-Cov-2 infection and should not be used as the sole basis for treatment or other patient management decisions. A negative result may occur with  improper specimen collection/handling, submission of specimen other than nasopharyngeal swab, presence of viral mutation(s) within the areas targeted by this assay, and inadequate number of viral copies(<138 copies/mL). A negative result must be combined with clinical observations, patient history, and  epidemiological information. The expected result is Negative.  Fact Sheet for Patients:  bloggercourse.com  Fact Sheet for Healthcare Providers:  seriousbroker.it  This test is no t yet approved or cleared by the [place]  FDA and  has been authorized for detection and/or diagnosis of SARS-CoV-2 by FDA under an Emergency Use Authorization (EUA). This EUA will remain  in effect (meaning this test can be used) for the duration of the COVID-19 declaration under Section 564(b)(1) of the Act, 21 U.S.C.section 360bbb-3(b)(1), unless the authorization is terminated  or revoked sooner.       Influenza A by PCR NEGATIVE NEGATIVE Final   Influenza B by PCR NEGATIVE NEGATIVE Final    Comment: (NOTE) The Xpert Xpress SARS-CoV-2/FLU/RSV plus assay is intended as an aid in the diagnosis of influenza from Nasopharyngeal swab specimens and should not be used  as a sole basis for treatment. Nasal washings and aspirates are unacceptable for Xpert Xpress SARS-CoV-2/FLU/RSV testing.  Fact Sheet for Patients: bloggercourse.com  Fact Sheet for Healthcare Providers: seriousbroker.it  This test is not yet approved or cleared by the [place]  FDA and has been authorized for detection and/or diagnosis of SARS-CoV-2 by FDA under an Emergency Use Authorization (EUA). This EUA will remain in effect (meaning this test can be used) for the duration of the COVID-19 declaration under Section 564(b)(1) of the Act, 21 U.S.C. section 360bbb-3(b)(1), unless the authorization is terminated or revoked.     Resp Syncytial Virus by PCR NEGATIVE NEGATIVE Final    Comment: (NOTE) Fact Sheet for Patients: bloggercourse.com  Fact Sheet for Healthcare Providers: seriousbroker.it  This test is not yet approved or cleared by the [place]  FDA and has been  authorized for detection and/or diagnosis of SARS-CoV-2 by FDA under an Emergency Use Authorization (EUA). This EUA will remain in effect (meaning this test can be used) for the duration of the COVID-19 declaration under Section 564(b)(1) of the Act, 21 U.S.C. section 360bbb-3(b)(1), unless the authorization is terminated or revoked.  Performed at [facility], [address] [place], [place] [address]      Labs: BNP (last 3 results) Recent Labs    09/03/23 0045 12/05/23 1233  BNP 22.6 132.2*   Basic Metabolic Panel: Recent Labs  Lab 12/05/23 1036 12/06/23 0606 12/07/23 0803 12/08/23 0453  NA 140 143 138 139  K 3.7 4.1 3.9 3.6  CL 91* 93* 90* 90*  CO2 37* 34* 39* 40*  GLUCOSE 153* 113* 118* 97  BUN 9 15 19 18   CREATININE 0.64 0.67 0.60 0.63  CALCIUM  9.0 9.4 9.1 9.0   Liver Function Tests: No results for input(s): AST, ALT, ALKPHOS, BILITOT, PROT, ALBUMIN in the last 168 hours. No results for input(s): LIPASE, AMYLASE in the last 168 hours. No results for input(s): AMMONIA in the last 168 hours. CBC: Recent Labs  Lab 12/05/23 1036 12/06/23 0606 12/07/23 0803 12/08/23 0453  WBC 10.4 9.3 10.6* 8.3  NEUTROABS  --   --   --  5.0  HGB 12.1 11.2* 11.2* 11.4*  HCT 41.1 37.3 37.3 36.9  MCV 93.0 89.7 87.8 87.0  PLT 179 216 231 235   Cardiac Enzymes: No results for input(s): CKTOTAL, CKMB, CKMBINDEX, TROPONINI in the last 168 hours. BNP: Invalid input(s): POCBNP CBG: Recent Labs  Lab 12/05/23 1614  GLUCAP 158*   D-Dimer No results for input(s): DDIMER in the last 72 hours. Hgb A1c No results for input(s): HGBA1C in the last 72 hours. Lipid Profile No results for input(s): CHOL, HDL, LDLCALC, TRIG, CHOLHDL, LDLDIRECT in the last 72 hours. Thyroid function studies No results for input(s): TSH, T4TOTAL, T3FREE, THYROIDAB in the last 72 hours.  Invalid input(s): FREET3 Anemia work up No  results for input(s): VITAMINB12, FOLATE, FERRITIN, TIBC, IRON, RETICCTPCT in the last 72 hours. Urinalysis    Component Value Date/Time   COLORURINE YELLOW (A) 12/07/2023 0156   APPEARANCEUR CLEAR (A) 12/07/2023 0156   APPEARANCEUR Turbid 01/16/2013 1541   LABSPEC 1.016 12/07/2023 0156   LABSPEC 1.025 01/16/2013 1541   PHURINE 6.0 12/07/2023 0156   GLUCOSEU NEGATIVE 12/07/2023 0156   GLUCOSEU Negative 01/16/2013 1541   HGBUR NEGATIVE 12/07/2023 0156   BILIRUBINUR NEGATIVE 12/07/2023 0156   BILIRUBINUR Negative 01/16/2013 1541   KETONESUR NEGATIVE 12/07/2023 0156   PROTEINUR NEGATIVE 12/07/2023 0156   NITRITE NEGATIVE 12/07/2023 0156  LEUKOCYTESUR NEGATIVE 12/07/2023 0156   LEUKOCYTESUR 3+ 01/16/2013 1541   Sepsis Labs Recent Labs  Lab 12/05/23 1036 12/06/23 0606 12/07/23 0803 12/08/23 0453  WBC 10.4 9.3 10.6* 8.3   Microbiology Recent Results (from the past 240 hours)  Resp panel by RT-PCR (RSV, Flu A&B, Covid) Anterior Nasal Swab     Status: None   Collection Time: 12/05/23  1:44 PM   Specimen: Anterior Nasal Swab  Result Value Ref Range Status   SARS Coronavirus 2 by RT PCR NEGATIVE NEGATIVE Final    Comment: (NOTE) SARS-CoV-2 target nucleic acids are NOT DETECTED.  The SARS-CoV-2 RNA is generally detectable in upper respiratory specimens during the acute phase of infection. The lowest concentration of SARS-CoV-2 viral copies this assay can detect is 138 copies/mL. A negative result does not preclude SARS-Cov-2 infection and should not be used as the sole basis for treatment or other patient management decisions. A negative result may occur with  improper specimen collection/handling, submission of specimen other than nasopharyngeal swab, presence of viral mutation(s) within the areas targeted by this assay, and inadequate number of viral copies(<138 copies/mL). A negative result must be combined with clinical observations, patient history, and  epidemiological information. The expected result is Negative.  Fact Sheet for Patients:  bloggercourse.com  Fact Sheet for Healthcare Providers:  seriousbroker.it  This test is no t yet approved or cleared by the [place]  FDA and  has been authorized for detection and/or diagnosis of SARS-CoV-2 by FDA under an Emergency Use Authorization (EUA). This EUA will remain  in effect (meaning this test can be used) for the duration of the COVID-19 declaration under Section 564(b)(1) of the Act, 21 U.S.C.section 360bbb-3(b)(1), unless the authorization is terminated  or revoked sooner.       Influenza A by PCR NEGATIVE NEGATIVE Final   Influenza B by PCR NEGATIVE NEGATIVE Final    Comment: (NOTE) The Xpert Xpress SARS-CoV-2/FLU/RSV plus assay is intended as an aid in the diagnosis of influenza from Nasopharyngeal swab specimens and should not be used as a sole basis for treatment. Nasal washings and aspirates are unacceptable for Xpert Xpress SARS-CoV-2/FLU/RSV testing.  Fact Sheet for Patients: bloggercourse.com  Fact Sheet for Healthcare Providers: seriousbroker.it  This test is not yet approved or cleared by the [place]  FDA and has been authorized for detection and/or diagnosis of SARS-CoV-2 by FDA under an Emergency Use Authorization (EUA). This EUA will remain in effect (meaning this test can be used) for the duration of the COVID-19 declaration under Section 564(b)(1) of the Act, 21 U.S.C. section 360bbb-3(b)(1), unless the authorization is terminated or revoked.     Resp Syncytial Virus by PCR NEGATIVE NEGATIVE Final    Comment: (NOTE) Fact Sheet for Patients: bloggercourse.com  Fact Sheet for Healthcare Providers: seriousbroker.it  This test is not yet approved or cleared by the [place]  FDA and has been  authorized for detection and/or diagnosis of SARS-CoV-2 by FDA under an Emergency Use Authorization (EUA). This EUA will remain in effect (meaning this test can be used) for the duration of the COVID-19 declaration under Section 564(b)(1) of the Act, 21 U.S.C. section 360bbb-3(b)(1), unless the authorization is terminated or revoked.  Performed at [facility], [address] [place], [place] [address]      Time coordinating discharge: Over 30 minutes  SIGNED:   Calvin KATHEE Robson, MD  [facility] 12/10/2023, 11:54 AM Pager   If 7PM-7AM, please contact night-coverage

## 2023-12-10 NOTE — TOC Transition Note (Signed)
 Transition of Care [facility]) - Discharge Note   Patient Details  Name: Caroline Coleman MRN: [id number] Date of Birth: 11/09/1956  [facility] CM/SW Contact:  Odella Ku, RN Phone Number: 12/10/2023, 12:05 PM   Clinical Narrative:     Spoke with patient, aware of discharge orders today.  State her daughter will provide transportation.  Patient is active with [facility], she and family will notify them of discharge.         Patient Goals and CMS Choice            Discharge Placement                       Discharge Plan and Services Additional resources added to the After Visit Summary for                                       Social Drivers of Health (SDOH) Interventions SDOH Screenings   Food Insecurity: No Food Insecurity (12/06/2023)  Housing: Low Risk  (12/06/2023)  Transportation Needs: Unmet Transportation Needs (12/06/2023)  Utilities: Not At Risk (12/06/2023)  Social Connections: Moderately Isolated (12/06/2023)  Tobacco Use: High Risk (12/05/2023)     Readmission Risk Interventions    09/04/2023    4:24 PM  Readmission Risk Prevention Plan  Transportation Screening Complete  PCP or Specialist Appt within 3-5 Days Complete  HRI or Home Care Consult Complete  Medication Review (RN Care Manager) Complete

## 2023-12-10 NOTE — Progress Notes (Signed)
 Patient being discharged home. PIV removed. Went over discharge instructions and medications with patient. Patient stated that she understood and all questions were answered. Patient will be going home with daughter POV.

## 2024-01-17 ENCOUNTER — Ambulatory Visit: Payer: Medicare (Managed Care) | Admitting: Pulmonary Disease

## 2024-01-24 ENCOUNTER — Ambulatory Visit: Payer: Medicare (Managed Care) | Admitting: Pulmonary Disease

## 2024-01-29 ENCOUNTER — Encounter: Payer: Self-pay | Admitting: Pulmonary Disease

## 2024-01-29 ENCOUNTER — Ambulatory Visit: Payer: Medicare (Managed Care) | Admitting: Pulmonary Disease

## 2024-02-26 ENCOUNTER — Other Ambulatory Visit: Payer: Self-pay | Admitting: Family Medicine

## 2024-02-26 DIAGNOSIS — M7989 Other specified soft tissue disorders: Secondary | ICD-10-CM

## 2024-03-11 ENCOUNTER — Ambulatory Visit: Payer: Medicare (Managed Care)

## 2024-03-19 ENCOUNTER — Ambulatory Visit: Payer: Medicare (Managed Care)

## 2024-04-02 ENCOUNTER — Ambulatory Visit: Payer: Medicare (Managed Care) | Attending: Family Medicine

## 2024-05-20 ENCOUNTER — Encounter: Payer: Self-pay | Admitting: Pulmonary Disease

## 2024-05-20 ENCOUNTER — Ambulatory Visit (INDEPENDENT_AMBULATORY_CARE_PROVIDER_SITE_OTHER): Payer: Medicare (Managed Care) | Admitting: Pulmonary Disease

## 2024-05-20 VITALS — BP 102/60 | HR 76 | Temp 98.9°F | Ht 64.0 in | Wt 160.8 lb

## 2024-05-20 DIAGNOSIS — F17210 Nicotine dependence, cigarettes, uncomplicated: Secondary | ICD-10-CM | POA: Diagnosis not present

## 2024-05-20 DIAGNOSIS — J439 Emphysema, unspecified: Secondary | ICD-10-CM | POA: Diagnosis not present

## 2024-05-20 DIAGNOSIS — J4489 Other specified chronic obstructive pulmonary disease: Secondary | ICD-10-CM

## 2024-05-20 MED ORDER — BREZTRI AEROSPHERE 160-9-4.8 MCG/ACT IN AERO: 2.0000 | INHALATION_SPRAY | Freq: Two times a day (BID) | RESPIRATORY_TRACT | 3 refills | Status: AC

## 2024-05-20 NOTE — Progress Notes (Unsigned)
 Synopsis: Referred in by Eleanore Ronal RAMAN, MD   Subjective:   PATIENT ID: Caroline Coleman GENDER: female DOB: November 12, 1956, MRN: [id number]  Chief Complaint  Patient presents with  . Follow-up    COPD follow up, had X-ray: 12/05/23 at ED  Still smoking, and doesn't think her breathing would get even if she quit, still struggling with DOE and on 3L of O2. Has a cat at home, and one that visits. Needs a refill on Albuterol  inhaler, and has excess Duoneb which she uses 2-3 times a day, and currently out of her trellegy and combivent inhalers.    HPI Ms. Osten is a 68 year old female patient with a past medical history of COPD no PFTs on file complicated by chronic hypoxic respiratory failure on 2 L nasal cannula, hyperlipidemia, hypothyroidism, anxiety/depression presenting today to the pulmonary clinic to establish care.  She was recently admitted to [facility] for COPD exacerbation and acute on chronic hypercapnic respiratory failure requiring BiPAP support.  She was discharged on prednisone .  She has been struggling with COPD for a long time and has been on 2 L nasal cannula continuously for years.  She is mostly sedentary and unable to walk around given the degree of her COPD.  She also reports continuous cough with sputum production.  She is enrolled in the low-dose CT scan program at [facility] and last 1 was in May which showed confluent centrilobular emphysema with diffuse bronchial wall thickening.  Family history -mother with lung cancer   Social history -smoked 2 pack/day for 40 years.  Denies alcohol denies illicit drug use.  Has 1 cat at home.  ROS All systems were reviewed and are negative except for the above.  Objective:   Vitals:   05/20/24 1053  BP: 102/60  Pulse: 76  Temp: 98.9 F (37.2 C)  TempSrc: Oral  SpO2: (!) 87%  Weight: 160 lb 12.8 oz (72.9 kg)  Height: 5' 4 (1.626 m)   (!) 87% on 2 LPM  BMI Readings from Last 3  Encounters:  05/20/24 27.60 kg/m  12/08/23 28.05 kg/m  10/16/23 28.08 kg/m   Wt Readings from Last 3 Encounters:  05/20/24 160 lb 12.8 oz (72.9 kg)  12/08/23 163 lb 6.4 oz (74.1 kg)  10/16/23 163 lb 9.6 oz (74.2 kg)    Physical Exam GEN: NAD, Healthy Appearing HEENT: Supple Neck, Reactive Pupils, EOMI  CVS: Normal S1, Normal S2, RRR, No murmurs or ES appreciated  Lungs: Diffuse expiratory wheezing, poor air movement.   Abdomen: Soft, non tender, non distended, + BS  Extremities: Warm and well perfused, No edema  Skin: No suspicious lesions appreciated  Psych: Normal Affect  Ancillary Information   CBC    Component Value Date/Time   WBC 8.3 12/08/2023 0453   RBC 4.24 12/08/2023 0453   HGB 11.4 (L) 12/08/2023 0453   HGB 13.9 10/01/2014 2221   HCT 36.9 12/08/2023 0453   HCT 41.7 10/01/2014 2221   PLT 235 12/08/2023 0453   PLT 162 10/01/2014 2221   MCV 87.0 12/08/2023 0453   MCV 90 10/01/2014 2221   MCH 26.9 12/08/2023 0453   MCHC 30.9 12/08/2023 0453   RDW 12.9 12/08/2023 0453   RDW 14.1 10/01/2014 2221   LYMPHSABS 2.2 12/08/2023 0453   MONOABS 0.8 12/08/2023 0453   EOSABS 0.0 12/08/2023 0453   BASOSABS 0.0 12/08/2023 0453   Labs and imaging were reviewed.      No data to display  Assessment & Plan:  Ms. Calamia is a 68 year old female patient with a past medical history of COPD no PFTs on file complicated by chronic hypoxic respiratory failure on 2 L nasal cannula, hyperlipidemia, hypothyroidism, anxiety/depression presenting today to the pulmonary clinic to establish care.  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  []  Obtain PFTs from [facility] []  CXR for today   []  Duonebs 4 times daily scheduled  []  Start budesonide  0.25 neb 2 times daily as needed. []  Albuterol  inhaler as needed  []  Refer to Pulmonary Rehab  []  Assess with [facility] for POC.   #Tobacco Use disorder  Will continue to discuss  quitting strategy. Enrolled in LDCT at [facility]. Repeat imaging in May 2025.    No follow-ups on file.  I spent 60 minutes caring for this patient today, including preparing to see the patient, obtaining a medical history , reviewing a separately obtained history, performing a medically appropriate examination and/or evaluation, counseling and educating the patient/family/caregiver, referring and communicating with other health care professionals (not separately reported), documenting clinical information in the electronic health record, and independently interpreting results (not separately reported/billed) and communicating results to the patient/family/caregiver  Darrin Barn, MD [facility] 05/20/2024 10:58 AM

## 2024-06-05 ENCOUNTER — Other Ambulatory Visit: Payer: Self-pay | Admitting: Family Medicine

## 2024-06-05 DIAGNOSIS — E042 Nontoxic multinodular goiter: Secondary | ICD-10-CM

## 2024-06-27 ENCOUNTER — Ambulatory Visit

## 2024-07-02 ENCOUNTER — Other Ambulatory Visit: Payer: Self-pay | Admitting: Family Medicine

## 2024-07-02 DIAGNOSIS — Z1231 Encounter for screening mammogram for malignant neoplasm of breast: Secondary | ICD-10-CM

## 2024-07-30 ENCOUNTER — Ambulatory Visit
Admission: RE | Admit: 2024-07-30 | Discharge: 2024-07-30 | Disposition: A | Discharge status: Home / Self Care | Source: Ambulatory Visit | Attending: Pulmonary Disease | Admitting: Pulmonary Disease

## 2024-07-30 DIAGNOSIS — J4489 Other specified chronic obstructive pulmonary disease: Secondary | ICD-10-CM

## 2024-08-05 ENCOUNTER — Encounter

## 2024-08-28 ENCOUNTER — Ambulatory Visit

## 2024-09-09 ENCOUNTER — Ambulatory Visit: Admitting: Pulmonary Disease

## 2024-09-09 ENCOUNTER — Encounter

## 2024-09-10 ENCOUNTER — Encounter

## 2024-09-27 ENCOUNTER — Ambulatory Visit: Attending: Pulmonary Disease

## 2024-10-09 ENCOUNTER — Ambulatory Visit: Admitting: Pulmonary Disease

## 2024-10-09 ENCOUNTER — Encounter

## 2024-10-10 ENCOUNTER — Encounter: Payer: Self-pay | Admitting: Pulmonary Disease

## 2024-11-18 ENCOUNTER — Other Ambulatory Visit: Payer: Self-pay | Admitting: Family Medicine

## 2024-11-18 DIAGNOSIS — L89512 Pressure ulcer of right ankle, stage 2: Secondary | ICD-10-CM

## 2024-11-22 ENCOUNTER — Ambulatory Visit

## 2024-11-25 ENCOUNTER — Ambulatory Visit

## 2024-11-29 ENCOUNTER — Ambulatory Visit
Admission: RE | Admit: 2024-11-29 | Discharge: 2024-11-29 | Disposition: A | Discharge status: Home / Self Care | Source: Ambulatory Visit | Attending: Family Medicine | Admitting: Family Medicine

## 2024-11-29 DIAGNOSIS — L89512 Pressure ulcer of right ankle, stage 2: Secondary | ICD-10-CM | POA: Insufficient documentation

## 2025-01-14 ENCOUNTER — Encounter
